# Patient Record
Sex: Female | Born: 1945 | Race: White | Hispanic: No | Marital: Married | Smoking: Never smoker
Health system: Southern US, Community
[De-identification: ages and names within clinical notes are randomized; demographics above are authoritative.]

## PROBLEM LIST (undated history)

## (undated) DIAGNOSIS — I5181 Takotsubo syndrome: Secondary | ICD-10-CM

## (undated) DIAGNOSIS — E039 Hypothyroidism, unspecified: Secondary | ICD-10-CM

## (undated) DIAGNOSIS — I519 Heart disease, unspecified: Secondary | ICD-10-CM

## (undated) HISTORY — DX: Heart disease, unspecified: I51.9

## (undated) HISTORY — PX: ABDOMINAL HYSTERECTOMY: SHX81

## (undated) HISTORY — PX: TONSILLECTOMY: SUR1361

## (undated) HISTORY — DX: Takotsubo syndrome: I51.81

## (undated) HISTORY — DX: Hypothyroidism, unspecified: E03.9

## (undated) HISTORY — PX: COLONOSCOPY: SHX174

---

## 1995-06-19 HISTORY — PX: THYROIDECTOMY: SHX17

## 2000-10-28 ENCOUNTER — Ambulatory Visit (HOSPITAL_COMMUNITY): Admission: RE | Admit: 2000-10-28 | Discharge: 2000-10-28 | Payer: Self-pay | Admitting: Family Medicine

## 2002-03-12 ENCOUNTER — Ambulatory Visit (HOSPITAL_COMMUNITY): Admission: RE | Admit: 2002-03-12 | Discharge: 2002-03-12 | Payer: Self-pay | Admitting: Family Medicine

## 2002-03-12 ENCOUNTER — Encounter: Payer: Self-pay | Admitting: Family Medicine

## 2002-06-02 ENCOUNTER — Ambulatory Visit (HOSPITAL_COMMUNITY): Admission: RE | Admit: 2002-06-02 | Discharge: 2002-06-02 | Payer: Self-pay | Admitting: Internal Medicine

## 2002-12-08 ENCOUNTER — Encounter: Payer: Self-pay | Admitting: Family Medicine

## 2002-12-08 ENCOUNTER — Ambulatory Visit (HOSPITAL_COMMUNITY): Admission: RE | Admit: 2002-12-08 | Discharge: 2002-12-08 | Payer: Self-pay | Admitting: Family Medicine

## 2004-03-21 ENCOUNTER — Ambulatory Visit (HOSPITAL_COMMUNITY): Admission: RE | Admit: 2004-03-21 | Discharge: 2004-03-21 | Payer: Self-pay | Admitting: Family Medicine

## 2005-10-18 ENCOUNTER — Ambulatory Visit (HOSPITAL_COMMUNITY): Admission: RE | Admit: 2005-10-18 | Discharge: 2005-10-18 | Payer: Self-pay | Admitting: *Deleted

## 2007-04-08 ENCOUNTER — Ambulatory Visit (HOSPITAL_COMMUNITY): Admission: RE | Admit: 2007-04-08 | Discharge: 2007-04-08 | Payer: Self-pay | Admitting: *Deleted

## 2007-05-05 ENCOUNTER — Ambulatory Visit (HOSPITAL_COMMUNITY): Admission: RE | Admit: 2007-05-05 | Discharge: 2007-05-05 | Payer: Self-pay | Admitting: *Deleted

## 2008-04-20 ENCOUNTER — Ambulatory Visit (HOSPITAL_COMMUNITY): Admission: RE | Admit: 2008-04-20 | Discharge: 2008-04-20 | Payer: Self-pay | Admitting: Family Medicine

## 2009-05-11 ENCOUNTER — Ambulatory Visit (HOSPITAL_COMMUNITY): Admission: RE | Admit: 2009-05-11 | Discharge: 2009-05-11 | Payer: Self-pay | Admitting: Family Medicine

## 2009-06-06 ENCOUNTER — Ambulatory Visit (HOSPITAL_COMMUNITY): Admission: RE | Admit: 2009-06-06 | Discharge: 2009-06-06 | Payer: Self-pay | Admitting: Family Medicine

## 2010-05-22 ENCOUNTER — Ambulatory Visit (HOSPITAL_COMMUNITY)
Admission: RE | Admit: 2010-05-22 | Discharge: 2010-05-22 | Payer: Self-pay | Source: Home / Self Care | Admitting: Family Medicine

## 2010-07-09 ENCOUNTER — Encounter: Payer: Self-pay | Admitting: Family Medicine

## 2010-11-03 NOTE — Op Note (Signed)
   NAME:  Martha Swanson, Martha Swanson                        ACCOUNT NO.:  [id number]   MEDICAL RECORD NO.:  [id number]                   PATIENT TYPE:  AMB   LOCATION:  DAY                                  FACILITY:  [facility]   PHYSICIAN:  Lionel December, M.D.                 DATE OF BIRTH:  [birth year]   DATE OF PROCEDURE:  06/02/2002  DATE OF DISCHARGE:                                 OPERATIVE REPORT   PROCEDURE:  Total colonoscopy.   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  Family history is negative for colorectal  carcinoma.  The procedure risks were reviewed and informed consent was  obtained.   PREMEDICATION:  Demerol 30 mg IV, Versed 3 mg IV in divided dose.   INSTRUMENT USED:  Olympus video system.   FINDINGS:  Procedure performed in endoscopy suite.  The patient's vital  signs and O2 saturation were monitored during the procedure and remained  stable.  The patient was placed in the left lateral recumbent position and a  rectal examination performed.  No abnormality noted on external or digital  exam.  The scope was placed in the rectum and advanced under vision in the  sigmoid colon and beyond.  Preparation was excellent.  The scope was passed  to the cecum, which was identified by appendiceal orifice and ileocecal  valve.  A picture was taken for the record.  As the scope was withdrawn,  colonic mucosa was once again carefully examined and was normal throughout.  Rectal mucosa similarly was normal.  The scope was retroflexed to examine  the anorectal junction, and small hemorrhoids were noted below the dentate  line.  Endoscope was straightened and withdrawn.  The patient tolerated the  procedure well.   FINAL DIAGNOSES:  Small external hemorrhoids, otherwise normal colonoscopy.    RECOMMENDATIONS:  1. Yearly Hemoccults.  2. She should consider having the next screening exam in 10 years from now.                                            Lionel December, M.D.    NR/MEDQ  D:  06/02/2002  T:  06/02/2002  Job:  [id number]   cc:   Donzetta Sprung  [address]  [place]  [place] [address]  Fax: [phone]

## 2011-02-12 ENCOUNTER — Inpatient Hospital Stay (HOSPITAL_COMMUNITY)
Admission: EM | Admit: 2011-02-12 | Discharge: 2011-02-16 | DRG: 287 | Disposition: A | Discharge status: Home / Self Care | Payer: Medicare Other | Source: Other Acute Inpatient Hospital | Attending: Cardiovascular Disease | Admitting: Cardiovascular Disease

## 2011-02-12 DIAGNOSIS — I059 Rheumatic mitral valve disease, unspecified: Secondary | ICD-10-CM | POA: Diagnosis present

## 2011-02-12 DIAGNOSIS — Z880 Allergy status to penicillin: Secondary | ICD-10-CM

## 2011-02-12 DIAGNOSIS — I5181 Takotsubo syndrome: Secondary | ICD-10-CM | POA: Diagnosis present

## 2011-02-12 DIAGNOSIS — Z7982 Long term (current) use of aspirin: Secondary | ICD-10-CM

## 2011-02-12 DIAGNOSIS — Z79899 Other long term (current) drug therapy: Secondary | ICD-10-CM

## 2011-02-12 DIAGNOSIS — I200 Unstable angina: Principal | ICD-10-CM | POA: Diagnosis present

## 2011-02-12 DIAGNOSIS — R079 Chest pain, unspecified: Secondary | ICD-10-CM

## 2011-02-12 DIAGNOSIS — E785 Hyperlipidemia, unspecified: Secondary | ICD-10-CM | POA: Diagnosis present

## 2011-02-12 DIAGNOSIS — E7800 Pure hypercholesterolemia, unspecified: Secondary | ICD-10-CM | POA: Diagnosis present

## 2011-02-12 DIAGNOSIS — I959 Hypotension, unspecified: Secondary | ICD-10-CM | POA: Diagnosis present

## 2011-02-12 DIAGNOSIS — E039 Hypothyroidism, unspecified: Secondary | ICD-10-CM | POA: Diagnosis present

## 2011-02-12 LAB — CARDIAC PANEL(CRET KIN+CKTOT+MB+TROPI)
CK, MB: 19.5 ng/mL (ref 0.3–4.0)
Relative Index: 10.2 — ABNORMAL HIGH (ref 0.0–2.5)
Total CK: 191 U/L — ABNORMAL HIGH (ref 7–177)
Troponin I: 2.69 ng/mL (ref <0.30)

## 2011-02-12 LAB — MRSA PCR SCREENING: MRSA by PCR: POSITIVE — AB

## 2011-02-13 ENCOUNTER — Inpatient Hospital Stay (HOSPITAL_COMMUNITY): Payer: Medicare Other

## 2011-02-13 LAB — LIPID PANEL
Cholesterol: 203 mg/dL — ABNORMAL HIGH (ref 0–200)
HDL: 54 mg/dL (ref >39)
LDL Cholesterol: 110 mg/dL — ABNORMAL HIGH (ref 0–99)
Total CHOL/HDL Ratio: 3.8 RATIO
Triglycerides: 194 mg/dL — ABNORMAL HIGH (ref <150)
VLDL: 39 mg/dL (ref 0–40)

## 2011-02-13 LAB — CARDIAC PANEL(CRET KIN+CKTOT+MB+TROPI)
CK, MB: 17 ng/mL (ref 0.3–4.0)
Relative Index: 9.8 — ABNORMAL HIGH (ref 0.0–2.5)
Total CK: 173 U/L (ref 7–177)
Troponin I: 2.99 ng/mL (ref <0.30)

## 2011-02-15 LAB — BASIC METABOLIC PANEL
BUN: 11 mg/dL (ref 6–23)
CO2: 25 mEq/L (ref 19–32)
Calcium: 9 mg/dL (ref 8.4–10.5)
Chloride: 109 mEq/L (ref 96–112)
Creatinine, Ser: 0.76 mg/dL (ref 0.50–1.10)
GFR calc Af Amer: >60 mL/min (ref >60)
GFR calc non Af Amer: >60 mL/min (ref >60)
Glucose, Bld: 97 mg/dL (ref 70–99)
Potassium: 4 mEq/L (ref 3.5–5.1)
Sodium: 140 mEq/L (ref 135–145)

## 2011-02-15 LAB — CBC
HCT: 38.6 % (ref 36.0–46.0)
Hemoglobin: 13.1 g/dL (ref 12.0–15.0)
MCH: 29.3 pg (ref 26.0–34.0)
MCHC: 33.9 g/dL (ref 30.0–36.0)
MCV: 86.4 fL (ref 78.0–100.0)
Platelets: 231 10*3/uL (ref 150–400)
RBC: 4.47 MIL/uL (ref 3.87–5.11)
RDW: 13.1 % (ref 11.5–15.5)
WBC: 5.3 10*3/uL (ref 4.0–10.5)

## 2011-02-16 DIAGNOSIS — I200 Unstable angina: Secondary | ICD-10-CM

## 2011-02-21 NOTE — Cardiovascular Report (Signed)
Martha Swanson, Martha Swanson              ACCOUNT NO.:  [id number]  MEDICAL RECORD NO.:  [id number]  LOCATION:  2906                         FACILITY:  [facility]  PHYSICIAN:  Veverly Fells. Excell Seltzer, MD  DATE OF BIRTH:  07-06-1945  DATE OF PROCEDURE:  02/12/2011 DATE OF DISCHARGE:                           CARDIAC CATHETERIZATION   PROCEDURES: 1. Left heart catheterization. 2. Selective coronary angiography. 3. Left ventricular angiography.  PROCEDURAL INDICATIONS:  [name] is a 65 year old woman who presented to [facility] this afternoon with unrelenting chest pain.  An EKG demonstrated anterior ST elevation.  She was evaluated probably by Dr. Andee Lineman, who performed a bedside echo demonstrating marked anteroapical and inferoapical akinesis with severe reduction in overall LV function.  He suspected takotsubo cardiomyopathy, but we are concerned about the possibility of ST elevation infarction based on her clinical presentation and EKG changes.  She was referred emergently for cardiac cath and presented directly to the cardiac cath lab via EMS.  Risks and indications of procedure were reviewed with the patient. Emergency consent was obtained.  The right wrist was prepped, draped, and anesthetized with 1% lidocaine using modified Seldinger technique. A 6-French sheath was placed in the right radial artery, 3 mg of verapamil was given through the sheath, 3000 units of unfractionated heparin was given intravenously.  Standard Judkins catheters were used for coronary angiography.  A pigtail catheter was used for left ventriculography.  There were no immediate procedural complications.  FINDINGS:  Aortic pressure 94/57 with a mean of 70, left ventricular pressure 128/30.  Left ventriculography shows akinesis of the anteroapical and inferoapical walls.  The base of the LV is hyperdynamic.  The ejection fraction is estimated at 25-30%.  There is moderate mitral regurgitation.  The left  ventricular appearance is one that is typical of takotsubo cardiomyopathy.  Coronary angiography.  The left mainstem is widely patent and divides into the LAD and left circumflex.  LAD:  Widely patent vessel with a large first diagonal.  There is no obstructive disease throughout the course of the LAD or its diagonal branches.  Left circumflex.  The left circumflex is a smooth vessel.  There is no obstructive disease visualized.  They are two major obtuse marginal branches without significant disease.  Right coronary artery:  The RCA is a dominant vessel.  There is no obstructive disease visualized.  It gives off a PDA and a posterolateral branch.  FINAL ASSESSMENT: 1. Widely patent coronary arteries. 2. Severe segmental left ventricular systolic dysfunction consistent     with takotsubo cardiomyopathy. 3. Moderate gradient suspect dynamic LV outflow tract obstruction in     the setting of takotsubo cardiomyopathy. 4. Moderate mitral regurgitation.  RECOMMENDATIONS:  The patient will be treated medically.  Her left ventricular filling pressures are very high and I am going to give her 40 mg of IV Lasix.  She bottomed her blood pressure out with nitroglycerin at [facility], and I suspect she is somewhat preload dependent.  We will follow her closely and we will try to initiate and we will aim for early initiation of a beta-blocker as well.     Veverly Fells. Excell Seltzer, MD     MDC/MEDQ  D:  02/12/2011  T:  02/13/2011  Job:  [id number]  cc:   Donzetta Sprung, MD Learta Codding, MD,FACC  Electronically Signed by Tonny Bollman MD on 02/21/2011 11:37:09 PM

## 2011-03-01 NOTE — Discharge Summary (Signed)
Martha Swanson, Martha Swanson NO.:  [id number]  MEDICAL RECORD NO.:  [id number]  LOCATION:  2007                         FACILITY:  [facility]  PHYSICIAN:  Verne Carrow, MDDATE OF BIRTH:  1945/09/04  DATE OF ADMISSION:  02/12/2011 DATE OF DISCHARGE:  02/16/2011                              DISCHARGE SUMMARY   PRIMARY CARDIOLOGIST:  Learta Codding, MD,FACC  DISCHARGE DIAGNOSIS:  Acute coronary syndrome.  SECONDARY DIAGNOSES: 1. 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 2. Hyperlipidemia. 3. Normal coronary arteries by catheterization this admission. 4. Hypotension preventing initiation of ACE inhibitor therapy. 5. Hypothyroidism.  ALLERGIES:  PENICILLIN causes anaphylaxis.  PROCEDURES:  Emergent left heart cardiac catheterization revealing normal coronary arteries with an EF 25-30% with typical Takotsubo appearance including moderate mitral regurgitation and anteroapical and inferoapical akinesis in a hyperdynamic base.  HISTORY OF PRESENT ILLNESS:  A 65 year old female without prior cardiac history who was in her usual state of health until approximately 11:30 a.m. on the day of admission when she developed substernal chest discomfort prompting her to present to [facility] where she was found to have significant anterior ST-segment elevation by ECG.  A code STEMI was called and bedside echo was performed showing marked anteroapical and inferior apical akinesis with severe reduction of her LV function.  There was high suspicion for Takotsubo cardiomyopathy but given ECG changes and symptoms, the patient was transferred to [facility] for emergent catheterization.  HOSPITAL COURSE:  The patient underwent diagnostic cardiac catheterization revealing normal coronary arteries with an EF of 25-30% with anteroapical and inferoapical akinesis suggestive of Takotsubo cardiomyopathy.  Medical therapy  was warranted and the patient was placed on beta-blocker therapy postprocedure.  Unfortunately, her blood pressures were relatively soft ranging in the 90-100 range thus preventing [place] from initiating ACE inhibitor therapy.  She has had no recurrent chest pain and despite an EF of 25-30% has had no evidence of congestive heart failure by exam.  We have been able to titrate her beta- blocker some and have consolidated to date Toprol XL.  She has been seen by Cardiac Rehab and is ambulating without difficulty.  She has also been counseled as to the importance of daily weights and sodium restriction, medication compliance, and symptom reporting.  She will be discharged home today in good condition and we will arrange for followup in our [place] office in approximately 2-3 weeks.  She will require followup echocardiogram over the next 3 months to reevaluate LV function on medical therapy.  DISCHARGE LABORATORY:  Hemoglobin 13.1, hematocrit 38.6, WBC 5.3, platelets 231.  Sodium 140, potassium 4.0, chloride 109, CO2 25, BUN 11, creatinine 0.76, glucose 97, calcium 9.0.  CK 173, MB 17.0, troponin-I 2.99, total cholesterol 203, triglycerides 194, HDL 54, LDL 110.  MRSA screen was positive.  DISPOSITION:  The patient will be discharged home today in good condition.  FOLLOWUP PLANS AND APPOINTMENTS:  We will arrange for followup in our [facility] office in approximately 2-3 weeks.  DISCHARGE MEDICATIONS: 1. Lipitor 10 mg at bedtime. 2. Nitroglycerin 0.4 mg sublingual p.r.n. chest pain. 3. Toprol-XL 25 mg 1-1/2 tablets daily. 4. Aspirin 81  mg daily. 5. Citracal plus D 1 tablet b.i.d. 6. Fish oil 1 capsule at bedtime. 7. Prilosec 20 mg daily p.r.n. 8. Synthroid 50 mcg daily.  OUTSTANDING LABORATORY STUDIES:  Followup echo in approximately 3 months.  DURATION OF DISCHARGE ENCOUNTER:  45 minutes including physician time.     Nicolasa Ducking, ANP   ______________________________ Verne Carrow, MD    CB/MEDQ  D:  02/16/2011  T:  02/16/2011  Job:  [id number]  Electronically Signed by Nicolasa Ducking ANP on 03/01/2011 03:20:11 PM Electronically Signed by Verne Carrow MD on 03/01/2011 09:36:21 PM

## 2011-03-07 ENCOUNTER — Ambulatory Visit (INDEPENDENT_AMBULATORY_CARE_PROVIDER_SITE_OTHER): Payer: Medicare Other | Admitting: Cardiology

## 2011-03-07 ENCOUNTER — Encounter: Payer: Self-pay | Admitting: Cardiology

## 2011-03-07 VITALS — BP 103/62 | HR 59 | Ht 60.0 in | Wt 111.0 lb

## 2011-03-07 DIAGNOSIS — I5181 Takotsubo syndrome: Secondary | ICD-10-CM | POA: Insufficient documentation

## 2011-03-07 NOTE — Progress Notes (Signed)
HPI The patient is a 65 year old female who presented with broken heart syndrome 3 weeks ago to the emergency room. She presented with substernal chest pain and EKG changes consistent with ischemia/infarction. Bedside echocardiogram however and the clinical presentation were more suggestive of broken heart syndrome. She was referred for cardiac catheterization which showed normal coronary arteries. The patient's left ventriculogram was consistent to broken heart syndrome. Her ejection fraction was very poor probably around 20% she was placed on beta blocker therapy and now presents for followup. She only was 5 days in the hospital and had a rapid recovery. She denies any chest pain or shortness of breath. A followup bedside echocardiogram today done in the office only 3 months after her initial presentation already shows normal heart function.   Allergies  Allergen Reactions  . Penicillins     No current outpatient prescriptions on file prior to visit.    No past medical history on file.  No past surgical history on file.  No family history on file.  History   Social History  . Marital Status: Married    Spouse Name: N/A    Number of Children: N/A  . Years of Education: N/A   Occupational History  . Not on file.   Social History Main Topics  . Smoking status: Never Smoker   . Smokeless tobacco: Never Used  . Alcohol Use: Not on file  . Drug Use: Not on file  . Sexually Active: Not on file   Other Topics Concern  . Not on file   Social History Narrative  . No narrative on file   ROS: Pertinent positives as outlined above. The remainder of the 18  point review of systems is negative  PHYSICAL EXAM BP 103/62  Pulse 59  Ht 5' (1.524 m)  Wt 111 lb (50.349 kg)  BMI 21.68 kg/m2 General: Well-developed, well-nourished in no distress Head: Normocephalic and atraumatic Eyes:PERRLA/EOMI intact, conjunctiva and lids normal Ears: No deformity or lesions Mouth:normal  dentition, normal posterior pharynx Neck: Supple, no JVD.  No masses, thyromegaly or abnormal cervical nodes Lungs: Normal breath sounds bilaterally without wheezing.  Normal percussion Cardiac: regular rate and rhythm with normal S1 and S2, no S3 or S4.  PMI is normal.  No pathological murmurs Abdomen: Normal bowel sounds, abdomen is soft and nontender without masses, organomegaly or hernias noted.  No hepatosplenomegaly MSK: Back normal, normal gait muscle strength and tone normal Vascular: Pulse is normal in all 4 extremities Extremities: No peripheral pitting edema Neurologic: Alert and oriented x 3 Skin: Intact without lesions or rashes Lymphatics: No significant adenopathy Psychologic: Normal affect  ECG: Not obtained  ASSESSMENT AND PLAN

## 2011-03-07 NOTE — Patient Instructions (Signed)
Continue all current medications. Your physician wants you to follow up in: 6 months.  You will receive a reminder letter in the mail one-two months in advance.  If you don't receive a letter, please call our office to schedule the follow up appointment   

## 2011-03-07 NOTE — Assessment & Plan Note (Signed)
Echocardiogram today demonstrates hyperdynamic LV function. Resolution of broken heart syndrome. Continue for now medical therapy with beta blocker. Likely can be discontinued in 6 months we will see the patient in followup.

## 2011-05-14 ENCOUNTER — Other Ambulatory Visit (HOSPITAL_COMMUNITY): Payer: Self-pay | Admitting: Family Medicine

## 2011-05-14 DIAGNOSIS — Z139 Encounter for screening, unspecified: Secondary | ICD-10-CM

## 2011-05-25 ENCOUNTER — Ambulatory Visit (HOSPITAL_COMMUNITY)
Admission: RE | Admit: 2011-05-25 | Discharge: 2011-05-25 | Disposition: A | Discharge status: Home / Self Care | Payer: Medicare Other | Source: Ambulatory Visit | Attending: Family Medicine | Admitting: Family Medicine

## 2011-05-25 DIAGNOSIS — Z139 Encounter for screening, unspecified: Secondary | ICD-10-CM

## 2011-05-25 DIAGNOSIS — Z1231 Encounter for screening mammogram for malignant neoplasm of breast: Secondary | ICD-10-CM | POA: Insufficient documentation

## 2011-08-28 ENCOUNTER — Encounter: Payer: Self-pay | Admitting: *Deleted

## 2011-09-04 ENCOUNTER — Encounter: Payer: Self-pay | Admitting: Cardiology

## 2011-09-04 ENCOUNTER — Ambulatory Visit (INDEPENDENT_AMBULATORY_CARE_PROVIDER_SITE_OTHER): Payer: Medicare Other | Admitting: Cardiology

## 2011-09-04 VITALS — BP 129/67 | HR 62 | Ht 60.0 in | Wt 106.0 lb

## 2011-09-04 DIAGNOSIS — I5181 Takotsubo syndrome: Secondary | ICD-10-CM | POA: Insufficient documentation

## 2011-09-04 DIAGNOSIS — E039 Hypothyroidism, unspecified: Secondary | ICD-10-CM | POA: Insufficient documentation

## 2011-09-04 NOTE — Progress Notes (Signed)
   Martha Bottoms, MD, [facility] ABIM Board Certified in Adult Cardiovascular Medicine,Internal Medicine and Critical Care Medicine    CC: Followup patient to broken heart syndrome  HPI:  Patient is doing quite well. She denies any chest pain shortness of breath orthopnea PND. She has had no heart failure symptoms. She has not been under any stress. Catheterization at the time of her initial presentation showed no coronary artery disease her ejection fraction has normalized. She denies any palpitations presyncope or syncope.  PMH: reviewed and listed in Problem List in Electronic Records (and see below) Past Medical History  Diagnosis Date  . Unspecified hypothyroidism   . Takotsubo cardiomyopathy      cardiac catheterization initial ejection fraction 20% no coronary artery disease ejection fraction improved after 5 days    No past surgical history on file.  Allergies/SH/FHX : available in Electronic Records for review  Allergies  Allergen Reactions  . Penicillins Anaphylaxis   History   Social History  . Marital Status: Married    Spouse Name: N/A    Number of Children: N/A  . Years of Education: N/A   Occupational History  .      Part-time pastor   Social History Main Topics  . Smoking status: Never Smoker   . Smokeless tobacco: Never Used  . Alcohol Use: Not on file  . Drug Use: Not on file  . Sexually Active: Not on file   Other Topics Concern  . Not on file   Social History Narrative  . No narrative on file   Family History  Problem Relation Age of Onset  . Heart attack Father 23    Medications: Current Outpatient Prescriptions  Medication Sig Dispense Refill  . aspirin 325 MG tablet Take 325 mg by mouth daily.        . calcium citrate-vitamin D (CITRACAL+D) 315-200 MG-UNIT per tablet Take 1 tablet by mouth daily.        [name] levothyroxine (SYNTHROID, LEVOTHROID) 50 MCG tablet Take 1 tablet by mouth Daily.      . metoprolol succinate (TOPROL-XL) 25 MG 24 hr  tablet Take 1.5 tablets by mouth Daily.      [name] NITROSTAT 0.4 MG SL tablet Take 1 tablet by mouth every 5 (five) minutes x 3 doses as needed.        ROS: No nausea or vomiting. No fever or chills.No melena or hematochezia.No bleeding.No claudication  Physical Exam: BP 129/67  Pulse 62  Ht 5' (1.524 m)  Wt 106 lb (48.081 kg)  BMI 20.70 kg/m2 General: Well-nourished white female in no distress Neck: Normal carotid upstroke no carotid bruit. No thyromegaly nonnodular thyroid. JVP is 6 cm Lungs: Clear breath sounds bilaterally. No wheezing Cardiac: Regular rate and rhythm with normal S1-S2 no pathological murmurs Vascular: No edema Skin: Warm and dry Physcologic: Normal affect  12lead ECG: Normal sinus rhythm  Nonspecific ST-T wave changes  Limited bedside ECHO:N/A No images are attached to the encounter.     Patient Active Problem List  Diagnoses  . Takotsubo cardiomyopathy  . Unspecified hypothyroidism    PLAN   Bedside echocardiogram during last office visit demonstrated normal last ejection fraction.  We'll continue low-dose beta blocker.  Patient was given a copy of her electrocardiogram for future reference.  She was also given literature on Minfulness  stress reduction techniques.  No definite indication for followup echocardiogram

## 2011-09-04 NOTE — Patient Instructions (Signed)
Continue all current medications. Follow up as needed  

## 2011-09-12 ENCOUNTER — Other Ambulatory Visit: Payer: Self-pay | Admitting: Cardiovascular Disease

## 2012-03-26 DIAGNOSIS — Z23 Encounter for immunization: Secondary | ICD-10-CM | POA: Diagnosis not present

## 2012-04-10 ENCOUNTER — Other Ambulatory Visit: Payer: Self-pay | Admitting: Cardiology

## 2012-05-12 ENCOUNTER — Other Ambulatory Visit (HOSPITAL_COMMUNITY): Payer: Self-pay | Admitting: Family Medicine

## 2012-05-12 DIAGNOSIS — Z139 Encounter for screening, unspecified: Secondary | ICD-10-CM

## 2012-05-26 ENCOUNTER — Ambulatory Visit (HOSPITAL_COMMUNITY)
Admission: RE | Admit: 2012-05-26 | Discharge: 2012-05-26 | Disposition: A | Discharge status: Home / Self Care | Payer: Medicare Other | Source: Ambulatory Visit | Attending: Family Medicine | Admitting: Family Medicine

## 2012-05-26 DIAGNOSIS — Z139 Encounter for screening, unspecified: Secondary | ICD-10-CM

## 2012-05-26 DIAGNOSIS — Z1231 Encounter for screening mammogram for malignant neoplasm of breast: Secondary | ICD-10-CM | POA: Diagnosis not present

## 2012-05-29 DIAGNOSIS — E782 Mixed hyperlipidemia: Secondary | ICD-10-CM | POA: Diagnosis not present

## 2012-05-29 DIAGNOSIS — E039 Hypothyroidism, unspecified: Secondary | ICD-10-CM | POA: Diagnosis not present

## 2012-06-05 DIAGNOSIS — E039 Hypothyroidism, unspecified: Secondary | ICD-10-CM | POA: Diagnosis not present

## 2012-06-05 DIAGNOSIS — I10 Essential (primary) hypertension: Secondary | ICD-10-CM | POA: Diagnosis not present

## 2012-06-05 DIAGNOSIS — Z0000 Encounter for general adult medical examination without abnormal findings: Secondary | ICD-10-CM | POA: Diagnosis not present

## 2012-06-25 ENCOUNTER — Encounter (INDEPENDENT_AMBULATORY_CARE_PROVIDER_SITE_OTHER): Payer: Self-pay | Admitting: *Deleted

## 2013-02-05 ENCOUNTER — Telehealth (INDEPENDENT_AMBULATORY_CARE_PROVIDER_SITE_OTHER): Payer: Self-pay | Admitting: *Deleted

## 2013-02-05 ENCOUNTER — Other Ambulatory Visit (INDEPENDENT_AMBULATORY_CARE_PROVIDER_SITE_OTHER): Payer: Self-pay | Admitting: *Deleted

## 2013-02-05 ENCOUNTER — Encounter (INDEPENDENT_AMBULATORY_CARE_PROVIDER_SITE_OTHER): Payer: Self-pay | Admitting: *Deleted

## 2013-02-05 DIAGNOSIS — Z1211 Encounter for screening for malignant neoplasm of colon: Secondary | ICD-10-CM

## 2013-02-05 MED ORDER — PEG-KCL-NACL-NASULF-NA ASC-C 100 G PO SOLR: 1.0000 | Freq: Once | ORAL | Status: DC

## 2013-02-05 NOTE — Telephone Encounter (Signed)
Patient needs movi prep 

## 2013-03-05 ENCOUNTER — Telehealth (INDEPENDENT_AMBULATORY_CARE_PROVIDER_SITE_OTHER): Payer: Self-pay | Admitting: *Deleted

## 2013-03-05 NOTE — Telephone Encounter (Signed)
  Procedure: tcs  Reason/Indication:  screening  Has patient had this procedure before?  Yes, 10 yrs ago  If so, when, by whom and where?    Is there a family history of colon cancer?  no  Who?  What age when diagnosed?    Is patient diabetic?   no      Does patient have prosthetic heart valve?  no  Do you have a pacemaker?  no  Has patient ever had endocarditis? no  Has patient had joint replacement within last 12 months?  no  Is patient on Coumadin, Plavix and/or Aspirin? no  Medications: synthroid 50 mcg daily  Allergies: pcn  Medication Adjustment:   Procedure date & time: 04/01/13 at 830

## 2013-03-06 NOTE — Telephone Encounter (Signed)
agree

## 2013-03-20 ENCOUNTER — Encounter (HOSPITAL_COMMUNITY): Payer: Self-pay | Admitting: Pharmacy Technician

## 2013-03-23 DIAGNOSIS — Z23 Encounter for immunization: Secondary | ICD-10-CM | POA: Diagnosis not present

## 2013-04-01 ENCOUNTER — Encounter (HOSPITAL_COMMUNITY)
Admission: RE | Disposition: A | Discharge status: Home / Self Care | Payer: Self-pay | Source: Ambulatory Visit | Attending: Internal Medicine

## 2013-04-01 ENCOUNTER — Ambulatory Visit (HOSPITAL_COMMUNITY)
Admission: RE | Admit: 2013-04-01 | Discharge: 2013-04-01 | Disposition: A | Discharge status: Home / Self Care | Payer: Medicare Other | Source: Ambulatory Visit | Attending: Internal Medicine | Admitting: Internal Medicine

## 2013-04-01 ENCOUNTER — Encounter (HOSPITAL_COMMUNITY): Payer: Self-pay | Admitting: *Deleted

## 2013-04-01 DIAGNOSIS — D126 Benign neoplasm of colon, unspecified: Secondary | ICD-10-CM | POA: Diagnosis not present

## 2013-04-01 DIAGNOSIS — Z1211 Encounter for screening for malignant neoplasm of colon: Secondary | ICD-10-CM | POA: Diagnosis not present

## 2013-04-01 HISTORY — PX: COLONOSCOPY: SHX5424

## 2013-04-01 SURGERY — COLONOSCOPY
Anesthesia: Moderate Sedation

## 2013-04-01 MED ORDER — STERILE WATER FOR IRRIGATION IR SOLN
Status: DC | PRN
  Administered 2013-04-01: 08:00:00

## 2013-04-01 MED ORDER — MEPERIDINE HCL 50 MG/ML IJ SOLN
INTRAMUSCULAR | Status: DC | PRN
  Administered 2013-04-01: 25 mg via INTRAVENOUS

## 2013-04-01 MED ORDER — MIDAZOLAM HCL 5 MG/5ML IJ SOLN
INTRAMUSCULAR | Status: DC | PRN
  Administered 2013-04-01 (×2): 2 mg via INTRAVENOUS
  Administered 2013-04-01: 1 mg via INTRAVENOUS

## 2013-04-01 MED ORDER — MIDAZOLAM HCL 5 MG/5ML IJ SOLN
INTRAMUSCULAR | Status: AC
  Filled 2013-04-01: qty 10

## 2013-04-01 MED ORDER — MEPERIDINE HCL 50 MG/ML IJ SOLN
INTRAMUSCULAR | Status: AC
  Filled 2013-04-01: qty 1

## 2013-04-01 MED ORDER — SODIUM CHLORIDE 0.9 % IV SOLN
INTRAVENOUS | Status: DC
  Administered 2013-04-01: 08:00:00 via INTRAVENOUS

## 2013-04-01 NOTE — Discharge Instructions (Signed)
Colonoscopy A colonoscopy is an exam to evaluate your entire colon. In this exam, your colon is cleansed. A long fiberoptic tube is inserted through your rectum and into your colon. The fiberoptic scope (endoscope) is a long bundle of enclosed and very flexible fibers. These fibers transmit light to the area examined and send images from that area to your caregiver. Discomfort is usually minimal. You may be given a drug to help you sleep (sedative) during or prior to the procedure. This exam helps to detect lumps (tumors), polyps, inflammation, and areas of bleeding. Your caregiver may also take a small piece of tissue (biopsy) that will be examined under a microscope. LET YOUR CAREGIVER KNOW ABOUT:   Allergies to food or medicine.  Medicines taken, including vitamins, herbs, eyedrops, over-the-counter medicines, and creams.  Use of steroids (by mouth or creams).  Previous problems with anesthetics or numbing medicines.  History of bleeding problems or blood clots.  Previous surgery.  Other health problems, including diabetes and kidney problems.  Possibility of pregnancy, if this applies. BEFORE THE PROCEDURE   A clear liquid diet may be required for 2 days before the exam.  Ask your caregiver about changing or stopping your regular medications.  Liquid injections (enemas) or laxatives may be required.  A large amount of electrolyte solution may be given to you to drink over a short period of time. This solution is used to clean out your colon.  You should be present 60 minutes prior to your procedure or as directed by your caregiver. AFTER THE PROCEDURE   If you received a sedative or pain relieving medication, you will need to arrange for someone to drive you home.  Occasionally, there is a little blood passed with the first bowel movement. Do not be concerned. FINDING OUT THE RESULTS OF YOUR TEST Not all test results are available during your visit. If your test results are  not back during the visit, make an appointment with your caregiver to find out the results. Do not assume everything is normal if you have not heard from your caregiver or the medical facility. It is important for you to follow up on all of your test results. HOME CARE INSTRUCTIONS   It is not unusual to pass moderate amounts of gas and experience mild abdominal cramping following the procedure. This is due to air being used to inflate your colon during the exam. Walking or a warm pack on your belly (abdomen) may help.  You may resume all normal meals and activities after sedatives and medicines have worn off.  Only take over-the-counter or prescription medicines for pain, discomfort, or fever as directed by your caregiver. Do not use aspirin or blood thinners if a biopsy was taken. Consult your caregiver for medicine usage if biopsies were taken. SEEK IMMEDIATE MEDICAL CARE IF:   You have a fever.  You pass large blood clots or fill a toilet with blood following the procedure. This may also occur 10 to 14 days following the procedure. This is more likely if a biopsy was taken.  You develop abdominal pain that keeps getting worse and cannot be relieved with medicine. Document Released: 06/01/2000 Document Revised: 08/27/2011 Document Reviewed: 01/15/2008 [facility] Patient Information 2014 [place], [place].    Resume usual medications and diet. No aspirin for one week. No driving for 24 hours. Remember you cannot have an MRI until Hemoclip has passed

## 2013-04-01 NOTE — H&P (Addendum)
Martha Swanson is an 67 y.o. female.   Chief Complaint: Patient is here for colonoscopy. 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 She denies abdominal pain change in her bowel habits or rectal bleeding. Last colonoscopy was 10 years ago and was normal. Family history is negative for CRC.  Past Medical History  Diagnosis Date  . Unspecified hypothyroidism   . Takotsubo cardiomyopathy      cardiac catheterization initial ejection fraction 20% no coronary artery disease ejection fraction improved after 5 days     Past Surgical History  Procedure Laterality Date  . Abdominal hysterectomy    . Colonoscopy    . Thyroidectomy  1997  . Tonsillectomy      Family History  Problem Relation Age of Onset  . Heart attack Father 68  . Colon cancer Neg Hx    Social History:  reports that she has never smoked. She has never used smokeless tobacco. She reports that she drinks alcohol. She reports that she does not use illicit drugs.  Allergies:  Allergies  Allergen Reactions  . Penicillins Anaphylaxis    Medications Prior to Admission  Medication Sig Dispense Refill  . calcium citrate-vitamin D (CITRACAL+D) 315-200 MG-UNIT per tablet Take 1 tablet by mouth daily.        [name] levothyroxine (SYNTHROID, LEVOTHROID) 50 MCG tablet Take 1 tablet by mouth Daily.      . peg 3350 powder (MOVIPREP) 100 G SOLR Take 1 kit (200 g total) by mouth once.  1 kit  0    No results found for this or any previous visit (from the past 48 hour(s)). No results found.  ROS  Blood pressure 148/74, pulse 78, temperature 97.7 F (36.5 C), temperature source Oral, resp. rate 22, height 4' 11.5" (1.511 m), weight 106 lb (48.081 kg), SpO2 99.00%. Physical Exam  Constitutional: She appears well-developed and well-nourished.  HENT:  Mouth/Throat: Oropharynx is clear and moist.  Eyes: Conjunctivae are normal. No scleral icterus.  Neck: No thyromegaly present.   Cardiovascular: Normal rate, regular rhythm and normal heart sounds.   Murmur: short systolic murmur best heard at aortic area  Respiratory: Effort normal and breath sounds normal.  GI: Soft. She exhibits no distension and no mass. There is no tenderness.  Musculoskeletal: She exhibits no edema.  Lymphadenopathy:    She has no cervical adenopathy.  Neurological: She is alert.  Skin: Skin is warm.     Assessment/Plan Average risk screening colonoscopy.  MarthaNAJEEB Swanson 04/01/2013, 8:28 AM

## 2013-04-01 NOTE — Op Note (Signed)
COLONOSCOPY PROCEDURE REPORT  PATIENT:  Martha Swanson  MR#:  [id number] Birthdate:  07-15-1945, 67 y.o., female Endoscopist:  Dr. [name], MD Referred By:  Dr. Donzetta Sprung, MD  Procedure Date: Apr 18, 2013  Procedure:   Colonoscopy with snare polypectomy.  Indications:  Patient is 67 year old Caucasian female who is undergoing average risk screening colonoscopy.  Informed Consent:  The procedure and risks were reviewed with the patient and informed consent was obtained.  Medications:  Demerol 25 mg IV Versed 5 mg IV  Description of procedure:  After a digital rectal exam was performed, that colonoscope was advanced from the anus through the rectum and colon to the area of the cecum, ileocecal valve and appendiceal orifice. The cecum was deeply intubated. These structures were well-seen and photographed for the record. From the level of the cecum and ileocecal valve, the scope was slowly and cautiously withdrawn. The mucosal surfaces were carefully surveyed utilizing scope tip to flexion to facilitate fold flattening as needed. The scope was pulled down into the rectum where a thorough exam including retroflexion was performed.  Findings:   Prep excellent. 6 mm flat polyp at sigmoid colon. Polyp was snared. Oozing noted from polypectomy site and controlled with application of single Hemoclip. Normal rectal mucosa and anal rectal junction.   Therapeutic/Diagnostic Maneuvers Performed:  See above  Complications:  None  Cecal Withdrawal Time:  19 minutes  Impression:  Examination performed to cecum. 6 mm flat polyp snared from sigmoid colon. Single Hemoclip applied because of oozing from polypectomy site.  Recommendations:  Standard instructions given. I will contact patient with biopsy results and further recommendations. Patient advised not to undergo MRI and to Hemoclip has passed.  Siddiq Kaluzny U  18-Apr-2013 9:13 AM  CC: Dr. Donzetta Sprung, MD & Dr. Bonnetta Barry ref.  provider found

## 2013-04-07 ENCOUNTER — Encounter (HOSPITAL_COMMUNITY): Payer: Self-pay | Admitting: Internal Medicine

## 2013-04-14 ENCOUNTER — Encounter (INDEPENDENT_AMBULATORY_CARE_PROVIDER_SITE_OTHER): Payer: Self-pay | Admitting: *Deleted

## 2013-05-12 ENCOUNTER — Other Ambulatory Visit (HOSPITAL_COMMUNITY): Payer: Self-pay | Admitting: Family Medicine

## 2013-05-12 DIAGNOSIS — Z139 Encounter for screening, unspecified: Secondary | ICD-10-CM

## 2013-05-28 ENCOUNTER — Ambulatory Visit (HOSPITAL_COMMUNITY)
Admission: RE | Admit: 2013-05-28 | Discharge: 2013-05-28 | Disposition: A | Discharge status: Home / Self Care | Payer: Medicare Other | Source: Ambulatory Visit | Attending: Family Medicine | Admitting: Family Medicine

## 2013-05-28 DIAGNOSIS — Z1231 Encounter for screening mammogram for malignant neoplasm of breast: Secondary | ICD-10-CM | POA: Insufficient documentation

## 2013-05-28 DIAGNOSIS — Z139 Encounter for screening, unspecified: Secondary | ICD-10-CM

## 2013-05-29 DIAGNOSIS — I10 Essential (primary) hypertension: Secondary | ICD-10-CM | POA: Diagnosis not present

## 2013-05-29 DIAGNOSIS — E782 Mixed hyperlipidemia: Secondary | ICD-10-CM | POA: Diagnosis not present

## 2013-05-29 DIAGNOSIS — E039 Hypothyroidism, unspecified: Secondary | ICD-10-CM | POA: Diagnosis not present

## 2013-06-02 DIAGNOSIS — H2510 Age-related nuclear cataract, unspecified eye: Secondary | ICD-10-CM | POA: Diagnosis not present

## 2013-06-09 DIAGNOSIS — Z0000 Encounter for general adult medical examination without abnormal findings: Secondary | ICD-10-CM | POA: Diagnosis not present

## 2013-06-09 DIAGNOSIS — E039 Hypothyroidism, unspecified: Secondary | ICD-10-CM | POA: Diagnosis not present

## 2013-06-09 DIAGNOSIS — Z1331 Encounter for screening for depression: Secondary | ICD-10-CM | POA: Diagnosis not present

## 2013-06-09 DIAGNOSIS — I10 Essential (primary) hypertension: Secondary | ICD-10-CM | POA: Diagnosis not present

## 2013-06-09 DIAGNOSIS — I5181 Takotsubo syndrome: Secondary | ICD-10-CM | POA: Diagnosis not present

## 2013-06-09 DIAGNOSIS — Z23 Encounter for immunization: Secondary | ICD-10-CM | POA: Diagnosis not present

## 2013-08-27 DIAGNOSIS — Z780 Asymptomatic menopausal state: Secondary | ICD-10-CM | POA: Diagnosis not present

## 2013-08-27 DIAGNOSIS — M810 Age-related osteoporosis without current pathological fracture: Secondary | ICD-10-CM | POA: Diagnosis not present

## 2014-03-18 DIAGNOSIS — Z23 Encounter for immunization: Secondary | ICD-10-CM | POA: Diagnosis not present

## 2014-05-20 ENCOUNTER — Other Ambulatory Visit (HOSPITAL_COMMUNITY): Payer: Self-pay | Admitting: Family Medicine

## 2014-05-20 DIAGNOSIS — Z1231 Encounter for screening mammogram for malignant neoplasm of breast: Secondary | ICD-10-CM

## 2014-05-31 ENCOUNTER — Ambulatory Visit (HOSPITAL_COMMUNITY)
Admission: RE | Admit: 2014-05-31 | Discharge: 2014-05-31 | Disposition: A | Discharge status: Home / Self Care | Payer: Medicare Other | Source: Ambulatory Visit | Attending: Family Medicine | Admitting: Family Medicine

## 2014-05-31 DIAGNOSIS — Z1231 Encounter for screening mammogram for malignant neoplasm of breast: Secondary | ICD-10-CM | POA: Insufficient documentation

## 2014-06-09 DIAGNOSIS — E782 Mixed hyperlipidemia: Secondary | ICD-10-CM | POA: Diagnosis not present

## 2014-06-09 DIAGNOSIS — I10 Essential (primary) hypertension: Secondary | ICD-10-CM | POA: Diagnosis not present

## 2014-06-09 DIAGNOSIS — E039 Hypothyroidism, unspecified: Secondary | ICD-10-CM | POA: Diagnosis not present

## 2014-06-16 DIAGNOSIS — Z9189 Other specified personal risk factors, not elsewhere classified: Secondary | ICD-10-CM | POA: Diagnosis not present

## 2014-06-16 DIAGNOSIS — I10 Essential (primary) hypertension: Secondary | ICD-10-CM | POA: Diagnosis not present

## 2014-06-16 DIAGNOSIS — Z1389 Encounter for screening for other disorder: Secondary | ICD-10-CM | POA: Diagnosis not present

## 2014-06-16 DIAGNOSIS — E782 Mixed hyperlipidemia: Secondary | ICD-10-CM | POA: Diagnosis not present

## 2014-06-16 DIAGNOSIS — Z0000 Encounter for general adult medical examination without abnormal findings: Secondary | ICD-10-CM | POA: Diagnosis not present

## 2014-06-16 DIAGNOSIS — E039 Hypothyroidism, unspecified: Secondary | ICD-10-CM | POA: Diagnosis not present

## 2014-07-06 DIAGNOSIS — H2513 Age-related nuclear cataract, bilateral: Secondary | ICD-10-CM | POA: Diagnosis not present

## 2015-03-10 DIAGNOSIS — Z23 Encounter for immunization: Secondary | ICD-10-CM | POA: Diagnosis not present

## 2015-05-18 ENCOUNTER — Other Ambulatory Visit (HOSPITAL_COMMUNITY): Payer: Self-pay | Admitting: Family Medicine

## 2015-05-18 DIAGNOSIS — Z1231 Encounter for screening mammogram for malignant neoplasm of breast: Secondary | ICD-10-CM

## 2015-06-06 ENCOUNTER — Ambulatory Visit (HOSPITAL_COMMUNITY)
Admission: RE | Admit: 2015-06-06 | Discharge: 2015-06-06 | Disposition: A | Discharge status: Home / Self Care | Payer: Medicare Other | Source: Ambulatory Visit | Attending: Family Medicine | Admitting: Family Medicine

## 2015-06-06 DIAGNOSIS — Z1231 Encounter for screening mammogram for malignant neoplasm of breast: Secondary | ICD-10-CM | POA: Diagnosis not present

## 2015-06-29 DIAGNOSIS — I10 Essential (primary) hypertension: Secondary | ICD-10-CM | POA: Diagnosis not present

## 2015-06-29 DIAGNOSIS — E039 Hypothyroidism, unspecified: Secondary | ICD-10-CM | POA: Diagnosis not present

## 2015-06-29 DIAGNOSIS — E782 Mixed hyperlipidemia: Secondary | ICD-10-CM | POA: Diagnosis not present

## 2015-06-30 LAB — LIPID PANEL
CHOLESTEROL: 177 (ref 0–200)
HDL: 62 (ref 35–70)
LDL Cholesterol: 91
TRIGLYCERIDES: 121 (ref 40–160)

## 2015-06-30 LAB — HEPATIC FUNCTION PANEL
ALT: 13 (ref 7–35)
AST: 13 (ref 13–35)
Alkaline Phosphatase: 71 (ref 25–125)
Bilirubin, Total: 0.3

## 2015-06-30 LAB — CBC AND DIFFERENTIAL
HCT: 42 (ref 36–46)
HEMOGLOBIN: 14.1 (ref 12.0–16.0)
Platelets: 230 (ref 150–399)
WBC: 3.5

## 2015-06-30 LAB — BASIC METABOLIC PANEL
BUN: 10 (ref 4–21)
CREATININE: 0.9 (ref 0.5–1.1)
Glucose: 91
POTASSIUM: 4.5 (ref 3.4–5.3)
SODIUM: 143 (ref 137–147)

## 2015-06-30 LAB — TSH: TSH: 1.25 (ref 0.41–5.90)

## 2015-07-06 DIAGNOSIS — E039 Hypothyroidism, unspecified: Secondary | ICD-10-CM | POA: Diagnosis not present

## 2015-07-06 DIAGNOSIS — Z23 Encounter for immunization: Secondary | ICD-10-CM | POA: Diagnosis not present

## 2015-07-06 DIAGNOSIS — Z0001 Encounter for general adult medical examination with abnormal findings: Secondary | ICD-10-CM | POA: Diagnosis not present

## 2015-07-06 DIAGNOSIS — E782 Mixed hyperlipidemia: Secondary | ICD-10-CM | POA: Diagnosis not present

## 2015-07-06 DIAGNOSIS — I10 Essential (primary) hypertension: Secondary | ICD-10-CM | POA: Diagnosis not present

## 2015-07-06 DIAGNOSIS — Z9189 Other specified personal risk factors, not elsewhere classified: Secondary | ICD-10-CM | POA: Diagnosis not present

## 2015-07-06 DIAGNOSIS — Z1389 Encounter for screening for other disorder: Secondary | ICD-10-CM | POA: Diagnosis not present

## 2016-03-08 DIAGNOSIS — Z23 Encounter for immunization: Secondary | ICD-10-CM | POA: Diagnosis not present

## 2016-03-08 DIAGNOSIS — H5203 Hypermetropia, bilateral: Secondary | ICD-10-CM | POA: Diagnosis not present

## 2016-03-08 DIAGNOSIS — H52223 Regular astigmatism, bilateral: Secondary | ICD-10-CM | POA: Diagnosis not present

## 2016-03-08 DIAGNOSIS — H2513 Age-related nuclear cataract, bilateral: Secondary | ICD-10-CM | POA: Diagnosis not present

## 2016-03-08 DIAGNOSIS — H43811 Vitreous degeneration, right eye: Secondary | ICD-10-CM | POA: Diagnosis not present

## 2016-03-08 DIAGNOSIS — H524 Presbyopia: Secondary | ICD-10-CM | POA: Diagnosis not present

## 2016-03-20 DIAGNOSIS — D239 Other benign neoplasm of skin, unspecified: Secondary | ICD-10-CM | POA: Diagnosis not present

## 2016-03-20 DIAGNOSIS — D3610 Benign neoplasm of peripheral nerves and autonomic nervous system, unspecified: Secondary | ICD-10-CM | POA: Diagnosis not present

## 2016-03-20 DIAGNOSIS — D485 Neoplasm of uncertain behavior of skin: Secondary | ICD-10-CM | POA: Diagnosis not present

## 2016-06-19 ENCOUNTER — Other Ambulatory Visit: Payer: Self-pay | Admitting: Family Medicine

## 2016-06-19 DIAGNOSIS — Z1231 Encounter for screening mammogram for malignant neoplasm of breast: Secondary | ICD-10-CM

## 2016-07-12 DIAGNOSIS — E039 Hypothyroidism, unspecified: Secondary | ICD-10-CM | POA: Diagnosis not present

## 2016-07-12 DIAGNOSIS — E782 Mixed hyperlipidemia: Secondary | ICD-10-CM | POA: Diagnosis not present

## 2016-07-12 DIAGNOSIS — Z23 Encounter for immunization: Secondary | ICD-10-CM | POA: Diagnosis not present

## 2016-07-12 DIAGNOSIS — Z1389 Encounter for screening for other disorder: Secondary | ICD-10-CM | POA: Diagnosis not present

## 2016-07-12 DIAGNOSIS — Z6821 Body mass index (BMI) 21.0-21.9, adult: Secondary | ICD-10-CM | POA: Diagnosis not present

## 2016-07-12 DIAGNOSIS — Z9189 Other specified personal risk factors, not elsewhere classified: Secondary | ICD-10-CM | POA: Diagnosis not present

## 2016-07-12 DIAGNOSIS — Z1212 Encounter for screening for malignant neoplasm of rectum: Secondary | ICD-10-CM | POA: Diagnosis not present

## 2016-07-12 DIAGNOSIS — Z0001 Encounter for general adult medical examination with abnormal findings: Secondary | ICD-10-CM | POA: Diagnosis not present

## 2016-07-13 ENCOUNTER — Ambulatory Visit
Admission: RE | Admit: 2016-07-13 | Discharge: 2016-07-13 | Disposition: A | Discharge status: Home / Self Care | Payer: Medicare Other | Source: Ambulatory Visit | Attending: Family Medicine | Admitting: Family Medicine

## 2016-07-13 DIAGNOSIS — Z1231 Encounter for screening mammogram for malignant neoplasm of breast: Secondary | ICD-10-CM | POA: Diagnosis not present

## 2017-07-08 ENCOUNTER — Other Ambulatory Visit: Payer: Self-pay | Admitting: Family Medicine

## 2017-07-08 DIAGNOSIS — Z1231 Encounter for screening mammogram for malignant neoplasm of breast: Secondary | ICD-10-CM

## 2017-07-15 DIAGNOSIS — Z0001 Encounter for general adult medical examination with abnormal findings: Secondary | ICD-10-CM | POA: Diagnosis not present

## 2017-07-15 DIAGNOSIS — Z9189 Other specified personal risk factors, not elsewhere classified: Secondary | ICD-10-CM | POA: Diagnosis not present

## 2017-07-15 DIAGNOSIS — E782 Mixed hyperlipidemia: Secondary | ICD-10-CM | POA: Diagnosis not present

## 2017-07-15 DIAGNOSIS — Z6822 Body mass index (BMI) 22.0-22.9, adult: Secondary | ICD-10-CM | POA: Diagnosis not present

## 2017-07-15 DIAGNOSIS — E039 Hypothyroidism, unspecified: Secondary | ICD-10-CM | POA: Diagnosis not present

## 2017-07-15 DIAGNOSIS — I10 Essential (primary) hypertension: Secondary | ICD-10-CM | POA: Diagnosis not present

## 2017-07-18 DIAGNOSIS — E782 Mixed hyperlipidemia: Secondary | ICD-10-CM | POA: Diagnosis not present

## 2017-07-18 DIAGNOSIS — E039 Hypothyroidism, unspecified: Secondary | ICD-10-CM | POA: Diagnosis not present

## 2017-07-18 DIAGNOSIS — Z1212 Encounter for screening for malignant neoplasm of rectum: Secondary | ICD-10-CM | POA: Diagnosis not present

## 2017-07-18 DIAGNOSIS — Z6821 Body mass index (BMI) 21.0-21.9, adult: Secondary | ICD-10-CM | POA: Diagnosis not present

## 2017-07-25 ENCOUNTER — Ambulatory Visit
Admission: RE | Admit: 2017-07-25 | Discharge: 2017-07-25 | Disposition: A | Discharge status: Home / Self Care | Payer: Medicare Other | Source: Ambulatory Visit | Attending: Family Medicine | Admitting: Family Medicine

## 2017-07-25 DIAGNOSIS — Z1231 Encounter for screening mammogram for malignant neoplasm of breast: Secondary | ICD-10-CM

## 2017-09-04 ENCOUNTER — Telehealth: Payer: Self-pay | Admitting: Family Medicine

## 2017-09-04 ENCOUNTER — Encounter: Payer: Self-pay | Admitting: Family Medicine

## 2017-09-04 ENCOUNTER — Ambulatory Visit (INDEPENDENT_AMBULATORY_CARE_PROVIDER_SITE_OTHER): Payer: Medicare Other | Admitting: Family Medicine

## 2017-09-04 VITALS — BP 110/70 | HR 75 | Ht <= 58 in | Wt 108.8 lb

## 2017-09-04 DIAGNOSIS — M810 Age-related osteoporosis without current pathological fracture: Secondary | ICD-10-CM | POA: Insufficient documentation

## 2017-09-04 DIAGNOSIS — E039 Hypothyroidism, unspecified: Secondary | ICD-10-CM

## 2017-09-04 NOTE — Telephone Encounter (Signed)
Mrs Martha Swanson would like for you to change the address in the system to [employer] on [address] the one that is on the AVS is in [place].   Thanks

## 2017-09-04 NOTE — Patient Instructions (Addendum)
Hypothyroidism Hypothyroidism is a disorder of the thyroid. The thyroid is a large gland that is located in the lower front of the neck. The thyroid releases hormones that control how the body works. With hypothyroidism, the thyroid does not make enough of these hormones. What are the causes? Causes of hypothyroidism may include:  Viral infections.  Pregnancy.  Your own defense system (immune system) attacking your thyroid.  Certain medicines.  Birth defects.  Past radiation treatments to your head or neck.  Past treatment with radioactive iodine.  Past surgical removal of part or all of your thyroid.  Problems with the gland that is located in the center of your brain (pituitary).  What are the signs or symptoms? Signs and symptoms of hypothyroidism may include:  Feeling as though you have no energy (lethargy).  Inability to tolerate cold.  Weight gain that is not explained by a change in diet or exercise habits.  Dry skin.  Coarse hair.  Menstrual irregularity.  Slowing of thought processes.  Constipation.  Sadness or depression.  How is this diagnosed? Your health care provider may diagnose hypothyroidism with blood tests and ultrasound tests. How is this treated? Hypothyroidism is treated with medicine that replaces the hormones that your body does not make. After you begin treatment, it may take several weeks for symptoms to go away. Follow these instructions at home:  Take medicines only as directed by your health care provider.  If you start taking any new medicines, tell your health care provider.  Keep all follow-up visits as directed by your health care provider. This is important. As your condition improves, your dosage needs may change. You will need to have blood tests regularly so that your health care provider can watch your condition. Contact a health care provider if:  Your symptoms do not get better with treatment.  You are taking thyroid  replacement medicine and: ? You sweat excessively. ? You have tremors. ? You feel anxious. ? You lose weight rapidly. ? You cannot tolerate heat. ? You have emotional swings. ? You have diarrhea. ? You feel weak. Get help right away if:  You develop chest pain.  You develop an irregular heartbeat.  You develop a rapid heartbeat. This information is not intended to replace advice given to you by your health care provider. Make sure you discuss any questions you have with your health care provider. Document Released: 06/04/2005 Document Revised: 11/10/2015 Document Reviewed: 10/20/2013 Elsevier Interactive Patient Education  2018 [employer].  Osteoporosis Osteoporosis is the thinning and loss of density in the bones. Osteoporosis makes the bones more brittle, fragile, and likely to break (fracture). Over time, osteoporosis can cause the bones to become so weak that they fracture after a simple fall. The bones most likely to fracture are the bones in the hip, wrist, and spine. What are the causes? The exact cause is not known. What increases the risk? Anyone can develop osteoporosis. You may be at greater risk if you have a family history of the condition or have poor nutrition. You may also have a higher risk if you are:  Female.  48 years old or older.  A smoker.  Not physically active.  White or Asian.  Slender.  What are the signs or symptoms? A fracture might be the first sign of the disease, especially if it results from a fall or injury that would not usually cause a bone to break. Other signs and symptoms include:  Low back and neck pain.  Stooped posture.  Height loss.  How is this diagnosed? To make a diagnosis, your health care provider may:  Take a medical history.  Perform a physical exam.  Order tests, such as: ? A bone mineral density test. ? A dual-energy X-ray absorptiometry test.  How is this treated? The goal of osteoporosis treatment is  to strengthen your bones to reduce your risk of a fracture. Treatment may involve:  Making lifestyle changes, such as: ? Eating a diet rich in calcium. ? Doing weight-bearing and muscle-strengthening exercises. ? Stopping tobacco use. ? Limiting alcohol intake.  Taking medicine to slow the process of bone loss or to increase bone density.  Monitoring your levels of calcium and vitamin D.  Follow these instructions at home:  Include calcium and vitamin D in your diet. Calcium is important for bone health, and vitamin D helps the body absorb calcium.  Perform weight-bearing and muscle-strengthening exercises as directed by your health care provider.  Do not use any tobacco products, including cigarettes, chewing tobacco, and electronic cigarettes. If you need help quitting, ask your health care provider.  Limit your alcohol intake.  Take medicines only as directed by your health care provider.  Keep all follow-up visits as directed by your health care provider. This is important.  Take precautions at home to lower your risk of falling, such as: ? Keeping rooms well lit and clutter free. ? Installing safety rails on stairs. ? Using rubber mats in the bathroom and other areas that are often wet or slippery. Get help right away if: You fall or injure yourself. This information is not intended to replace advice given to you by your health care provider. Make sure you discuss any questions you have with your health care provider. Document Released: 03/14/2005 Document Revised: 11/07/2015 Document Reviewed: 11/12/2013 Elsevier Interactive Patient Education  [employer].

## 2017-09-04 NOTE — Telephone Encounter (Signed)
Done, thanks

## 2017-09-04 NOTE — Progress Notes (Signed)
   Subjective:  Patient ID: Martha Swanson, female    DOB: 1946-05-07  Age: 72 y.o. MRN: [id number]  CC: New Patient (Initial Visit)   HPI Martha Swanson presents for establishment of care and for follow-up of her hypothyroidism and osteoporosis.  Her TSH levels have been stable for years on her current dose of thyroid medication.  She had a DEXA scan 5 years ago that did show osteoporosis.  She has been taking Fosamax weekly without Os-Cal.  She has not had a pathological fracture.  She lives with lives with her husband and retired from an endoscopy lab and reads from [employer].  She is lived to see her grandchildren and great-grandchildren.  She is active with her husband and they walk on a daily basis.  She does not smoke or use illicit drugs.  She drinks alcohol on occasion.  She is status post TAH for a what large fibroid.  She has yearly mammograms.  Colonoscopy back in 2014 was normal and she was told to follow-up in 10 years.  History Martha Swanson has a past medical history of Takotsubo cardiomyopathy and Unspecified hypothyroidism.   She has a past surgical history that includes Abdominal hysterectomy; Colonoscopy; Thyroidectomy (1997); Tonsillectomy; and Colonoscopy (N/A, 04/01/2013).   Her family history includes Heart attack (age of onset: 83) in her father.She reports that  has never smoked. she has never used smokeless tobacco. She reports that she drinks alcohol. She reports that she does not use drugs.  Outpatient Medications Prior to Visit  Medication Sig Dispense Refill  . alendronate (FOSAMAX) 70 MG tablet     . calcium citrate-vitamin D (CITRACAL+D) 315-200 MG-UNIT per tablet Take 1 tablet by mouth daily.      [name] levothyroxine (SYNTHROID, LEVOTHROID) 50 MCG tablet Take 1 tablet by mouth Daily.     No facility-administered medications prior to visit.     ROS Review of Systems  Constitutional: Negative.   HENT: Negative.   Eyes: Negative.   Respiratory: Negative.     Cardiovascular: Negative.   Gastrointestinal: Negative.   Endocrine: Negative for cold intolerance and heat intolerance.  Genitourinary: Negative.   Musculoskeletal: Negative for gait problem and myalgias.  Skin: Negative for pallor and wound.  Allergic/Immunologic: Negative for immunocompromised state.  Neurological: Negative for weakness and headaches.  Hematological: Does not bruise/bleed easily.  Psychiatric/Behavioral: Negative.     Objective:  BP 110/70 (BP Location: Left Arm, Patient Position: Sitting, Cuff Size: Normal)   Pulse 75   Ht 4\' 10"  (1.473 m)   Wt 108 lb 12.8 oz (49.4 kg)   BMI 22.74 kg/m   Physical Exam  Constitutional: She appears well-developed and well-nourished. No distress.  Skin: She is not diaphoretic.      Assessment & Plan:   Martha Swanson was seen today for new patient (initial visit).  Diagnoses and all orders for this visit:  Osteoporosis without current pathological fracture, unspecified osteoporosis type -     DG Bone Density; Future  Acquired hypothyroidism   I am having Martha Swanson maintain her levothyroxine, calcium citrate-vitamin D, and alendronate.  No orders of the defined types were placed in this encounter.    Follow-up: Return in about 3 months (around 12/05/2017).  Libby Maw, MD

## 2017-09-05 ENCOUNTER — Encounter: Payer: Self-pay | Admitting: Family Medicine

## 2017-09-06 ENCOUNTER — Encounter: Payer: Self-pay | Admitting: Family Medicine

## 2017-09-12 ENCOUNTER — Inpatient Hospital Stay: Admission: RE | Admit: 2017-09-12 | Payer: Medicare Other | Source: Ambulatory Visit

## 2017-11-28 ENCOUNTER — Ambulatory Visit (INDEPENDENT_AMBULATORY_CARE_PROVIDER_SITE_OTHER)
Admission: RE | Admit: 2017-11-28 | Discharge: 2017-11-28 | Disposition: A | Discharge status: Home / Self Care | Payer: Medicare Other | Source: Ambulatory Visit | Attending: Family Medicine | Admitting: Family Medicine

## 2017-11-28 DIAGNOSIS — M810 Age-related osteoporosis without current pathological fracture: Secondary | ICD-10-CM

## 2017-12-05 ENCOUNTER — Encounter: Payer: Self-pay | Admitting: Family Medicine

## 2017-12-05 ENCOUNTER — Ambulatory Visit: Payer: Medicare Other | Admitting: Family Medicine

## 2017-12-05 ENCOUNTER — Ambulatory Visit (INDEPENDENT_AMBULATORY_CARE_PROVIDER_SITE_OTHER): Payer: Medicare Other | Admitting: Family Medicine

## 2017-12-05 VITALS — BP 120/72 | HR 75 | Temp 98.3°F | Ht <= 58 in | Wt 107.5 lb

## 2017-12-05 DIAGNOSIS — E559 Vitamin D deficiency, unspecified: Secondary | ICD-10-CM | POA: Diagnosis not present

## 2017-12-05 DIAGNOSIS — M810 Age-related osteoporosis without current pathological fracture: Secondary | ICD-10-CM | POA: Diagnosis not present

## 2017-12-05 LAB — COMPREHENSIVE METABOLIC PANEL
ALK PHOS: 61 U/L (ref 39–117)
ALT: 11 U/L (ref 0–35)
AST: 13 U/L (ref 0–37)
Albumin: 4.1 g/dL (ref 3.5–5.2)
BILIRUBIN TOTAL: 0.6 mg/dL (ref 0.2–1.2)
BUN: 10 mg/dL (ref 6–23)
CO2: 28 mEq/L (ref 19–32)
Calcium: 9.5 mg/dL (ref 8.4–10.5)
Chloride: 106 mEq/L (ref 96–112)
Creatinine, Ser: 0.87 mg/dL (ref 0.40–1.20)
GFR: 68 mL/min (ref >60.00)
GLUCOSE: 92 mg/dL (ref 70–99)
Potassium: 4.3 mEq/L (ref 3.5–5.1)
Sodium: 140 mEq/L (ref 135–145)
TOTAL PROTEIN: 6.6 g/dL (ref 6.0–8.3)

## 2017-12-05 LAB — VITAMIN D 25 HYDROXY (VIT D DEFICIENCY, FRACTURES): VITD: 24.57 ng/mL — AB (ref 30.00–100.00)

## 2017-12-05 MED ORDER — VITAMIN D (ERGOCALCIFEROL) 1.25 MG (50000 UNIT) PO CAPS: 50000.0000 [IU] | ORAL_CAPSULE | ORAL | 2 refills | Status: DC

## 2017-12-05 MED ORDER — CALCIUM CARBONATE 600 MG PO TABS: 600.0000 mg | ORAL_TABLET | Freq: Two times a day (BID) | ORAL | 12 refills | Status: AC

## 2017-12-05 MED ORDER — CALCIUM CARBONATE-VITAMIN D 500-200 MG-UNIT PO TABS: 1.0000 | ORAL_TABLET | Freq: Two times a day (BID) | ORAL | 1 refills | Status: DC

## 2017-12-05 MED ORDER — ALENDRONATE SODIUM 70 MG PO TABS: 70.0000 mg | ORAL_TABLET | ORAL | 2 refills | Status: DC

## 2017-12-05 NOTE — Progress Notes (Addendum)
Subjective:  Patient ID: Martha Swanson, female    DOB: [birth year]  Age: 72 y.o. MRN: [id number]  CC: Follow-up   HPI Martha Swanson presents for follow-up of her osteoporosis and recent DEXA scan that did given her of T score of -3.2 in the distal radius.  She has been taking Fosamax weekly regularly since 2015.  She admits that she is not always compliant with her calcium and vitamin D supplements.  She does consume a lot of calcium in the diet with dairy products and vegetables containing calcium.  She does reck exercise regularly by walking.  She has never smoked.  She has never had an osteoporotic fracture to her knowledge.  Outpatient Medications Prior to Visit  Medication Sig Dispense Refill  . levothyroxine (SYNTHROID, LEVOTHROID) 50 MCG tablet Take 1 tablet by mouth Daily.    [name] alendronate (FOSAMAX) 70 MG tablet     . calcium citrate-vitamin D (CITRACAL+D) 315-200 MG-UNIT per tablet Take 1 tablet by mouth daily.       No facility-administered medications prior to visit.     ROS Review of Systems  Constitutional: Negative for chills, fatigue, fever and unexpected weight change.  HENT: Negative.   Eyes: Negative.   Respiratory: Negative.   Gastrointestinal: Negative.   Endocrine: Negative for cold intolerance and heat intolerance.  Genitourinary: Negative.   Musculoskeletal: Negative for arthralgias, back pain and gait problem.  Skin: Negative.   Allergic/Immunologic: Negative for immunocompromised state.  Neurological: Negative for weakness and numbness.  Hematological: Does not bruise/bleed easily.  Psychiatric/Behavioral: Negative.     Objective:  BP 120/72   Pulse 75   Temp 98.3 F (36.8 C)   Ht 4\' 10"  (1.473 m)   Wt 107 lb 8 oz (48.8 kg)   SpO2 98%   BMI 22.47 kg/m   BP Readings from Last 3 Encounters:  12/05/17 120/72  09/04/17 110/70  04/01/13 133/69    Wt Readings from Last 3 Encounters:  12/05/17 107 lb 8 oz (48.8 kg)  09/04/17 108 lb 12.8  oz (49.4 kg)  04/01/13 106 lb (48.1 kg)    Physical Exam  Lab Results  Component Value Date   WBC 3.5 06/30/2015   HGB 14.1 06/30/2015   HCT 42 06/30/2015   PLT 230 06/30/2015   GLUCOSE 92 12/05/2017   CHOL 177 06/30/2015   TRIG 121 06/30/2015   HDL 62 06/30/2015   LDLCALC 91 06/30/2015   ALT 11 12/05/2017   AST 13 12/05/2017   NA 140 12/05/2017   K 4.3 12/05/2017   CL 106 12/05/2017   CREATININE 0.87 12/05/2017   BUN 10 12/05/2017   CO2 28 12/05/2017   TSH 1.25 06/30/2015    Dg Bone Density  Result Date: 12/01/2017 Date of study: 11/28/17 Exam: DUAL X-RAY ABSORPTIOMETRY (DXA) FOR BONE MINERAL DENSITY (BMD) Instrument: [employer] [facility] Provider: PCP Indication: screening for osteoporosis Comparison: none (please note that it is not possible to compare data from different instruments) Clinical data: Pt is a 71 y.o. female without history of fracture. (taking Fosamax) Results:  Lumbar spine L1-L4 Femoral neck (FN) 33% distal radius T-score -3.2 RFN: -3.3 LFN: -3.1 n/a Change in BMD from previous DXA test (%) n/a n/a n/a (*) statistically significant Assessment: Patient has OSTEOPOROSIS according to the [facility] classification for osteoporosis (see below). Fracture risk: high Comments: the technical quality of the study is good  Scoliosis may falsely elevate the spine score. Evaluation for secondary causes should be considered  if clinically indicated. Recommend optimizing calcium (1200 mg/day) and vitamin D (800 IU/day). Followup: Repeat BMD is appropriate after 2 years or after 1-2 years if starting treatment. WHO criteria for diagnosis of osteoporosis in postmenopausal women and in men 63 y/o or older: - normal: T-score -1.0 to + 1.0 - osteopenia/low bone density: T-score between -2.5 and -1.0 - osteoporosis: T-score below -2.5 - severe osteoporosis: T-score below -2.5 with history of fragility fracture Note: although not part of the WHO classification, the presence of a  fragility fracture, regardless of the T-score, should be considered diagnostic of osteoporosis, provided other causes for the fracture have been excluded. Treatment: The National Osteoporosis Foundation recommends that treatment be considered in postmenopausal women and men age 89 or older with: 1. Hip or vertebral (clinical or morphometric) fracture 2. T-score of - 2.5 or lower at the spine or hip 3. 10-year fracture probability by FRAX of at least 20% for a major osteoporotic fracture and 3% for a hip fracture [name]    Assessment & Plan:   Dala was seen today for follow-up.  Diagnoses and all orders for this visit:  Age-related osteoporosis without current pathological fracture -     alendronate (FOSAMAX) 70 MG tablet; Take 1 tablet (70 mg total) by mouth once a week. -     Discontinue: calcium-vitamin D (OSCAL WITH D) 500-200 MG-UNIT tablet; Take 1 tablet by mouth 2 (two) times daily. -     VITAMIN D 25 Hydroxy (Vit-D Deficiency, Fractures) -     Comprehensive metabolic panel -     calcium carbonate (OS-CAL) 600 MG TABS tablet; Take 1 tablet (600 mg total) by mouth 2 (two) times daily with a meal.  Vitamin D deficiency -     Vitamin D, Ergocalciferol, (DRISDOL) 50000 units CAPS capsule; Take 1 capsule (50,000 Units total) by mouth every 7 (seven) days.   I have discontinued [name]'s calcium citrate-vitamin D and calcium-vitamin D. I have also changed her alendronate. Additionally, I am having her start on Vitamin D (Ergocalciferol) and calcium carbonate. Lastly, I am having her maintain her levothyroxine.  Meds ordered this encounter  Medications  . alendronate (FOSAMAX) 70 MG tablet    Sig: Take 1 tablet (70 mg total) by mouth once a week.    Dispense:  12 tablet    Refill:  2  . DISCONTD: calcium-vitamin D (OSCAL WITH D) 500-200 MG-UNIT tablet    Sig: Take 1 tablet by mouth 2 (two) times daily.    Dispense:  180 tablet    Refill:  1  . Vitamin D,  Ergocalciferol, (DRISDOL) 50000 units CAPS capsule    Sig: Take 1 capsule (50,000 Units total) by mouth every 7 (seven) days.    Dispense:  15 capsule    Refill:  2  . calcium carbonate (OS-CAL) 600 MG TABS tablet    Sig: Take 1 tablet (600 mg total) by mouth 2 (two) times daily with a meal.    Dispense:  60 tablet    Refill:  12     Follow-up: Return in about 3 months (around 03/07/2018).  Libby Maw, MD

## 2017-12-05 NOTE — Patient Instructions (Addendum)
Osteoporosis Osteoporosis is the thinning and loss of density in the bones. Osteoporosis makes the bones more brittle, fragile, and likely to break (fracture). Over time, osteoporosis can cause the bones to become so weak that they fracture after a simple fall. The bones most likely to fracture are the bones in the hip, wrist, and spine. What are the causes? The exact cause is not known. What increases the risk? Anyone can develop osteoporosis. You may be at greater risk if you have a family history of the condition or have poor nutrition. You may also have a higher risk if you are:  Female.  43 years old or older.  A smoker.  Not physically active.  White or Asian.  Slender.  What are the signs or symptoms? A fracture might be the first sign of the disease, especially if it results from a fall or injury that would not usually cause a bone to break. Other signs and symptoms include:  Low back and neck pain.  Stooped posture.  Height loss.  How is this diagnosed? To make a diagnosis, your health care provider may:  Take a medical history.  Perform a physical exam.  Order tests, such as: ? A bone mineral density test. ? A dual-energy X-ray absorptiometry test.  How is this treated? The goal of osteoporosis treatment is to strengthen your bones to reduce your risk of a fracture. Treatment may involve:  Making lifestyle changes, such as: ? Eating a diet rich in calcium. ? Doing weight-bearing and muscle-strengthening exercises. ? Stopping tobacco use. ? Limiting alcohol intake.  Taking medicine to slow the process of bone loss or to increase bone density.  Monitoring your levels of calcium and vitamin D.  Follow these instructions at home:  Include calcium and vitamin D in your diet. Calcium is important for bone health, and vitamin D helps the body absorb calcium.  Perform weight-bearing and muscle-strengthening exercises as directed by your health care  provider.  Do not use any tobacco products, including cigarettes, chewing tobacco, and electronic cigarettes. If you need help quitting, ask your health care provider.  Limit your alcohol intake.  Take medicines only as directed by your health care provider.  Keep all follow-up visits as directed by your health care provider. This is important.  Take precautions at home to lower your risk of falling, such as: ? Keeping rooms well lit and clutter free. ? Installing safety rails on stairs. ? Using rubber mats in the bathroom and other areas that are often wet or slippery. Get help right away if: You fall or injure yourself. This information is not intended to replace advice given to you by your health care provider. Make sure you discuss any questions you have with your health care provider. Document Released: 03/14/2005 Document Revised: 11/07/2015 Document Reviewed: 11/12/2013 Elsevier Interactive Patient Education  2018 [place] in the Home Falls can cause injuries and can affect people from all age groups. There are many simple things that you can do to make your home safe and to help prevent falls. What can I do on the outside of my home?  Regularly repair the edges of walkways and driveways and fix any cracks.  Remove high doorway thresholds.  Trim any shrubbery on the main path into your home.  Use bright outdoor lighting.  Clear walkways of debris and clutter, including tools and rocks.  Regularly check that handrails are securely fastened and in good repair. Both sides of any steps should  have handrails.  Install guardrails along the edges of any raised decks or porches.  Have leaves, snow, and ice cleared regularly.  Use sand or salt on walkways during winter months.  In the garage, clean up any spills right away, including grease or oil spills. What can I do in the bathroom?  Use night lights.  Install grab bars by the toilet and in the tub  and shower. Do not use towel bars as grab bars.  Use non-skid mats or decals on the floor of the tub or shower.  If you need to sit down while you are in the shower, use a plastic, non-slip stool.  Keep the floor dry. Immediately clean up any water that spills on the floor.  Remove soap buildup in the tub or shower on a regular basis.  Attach bath mats securely with double-sided non-slip rug tape.  Remove throw rugs and other tripping hazards from the floor. What can I do in the bedroom?  Use night lights.  Make sure that a bedside light is easy to reach.  Do not use oversized bedding that drapes onto the floor.  Have a firm chair that has side arms to use for getting dressed.  Remove throw rugs and other tripping hazards from the floor. What can I do in the kitchen?  Clean up any spills right away.  Avoid walking on wet floors.  Place frequently used items in easy-to-reach places.  If you need to reach for something above you, use a sturdy step stool that has a grab bar.  Keep electrical cables out of the way.  Do not use floor polish or wax that makes floors slippery. If you have to use wax, make sure that it is non-skid floor wax.  Remove throw rugs and other tripping hazards from the floor. What can I do in the stairways?  Do not leave any items on the stairs.  Make sure that there are handrails on both sides of the stairs. Fix handrails that are broken or loose. Make sure that handrails are as long as the stairways.  Check any carpeting to make sure that it is firmly attached to the stairs. Fix any carpet that is loose or worn.  Avoid having throw rugs at the top or bottom of stairways, or secure the rugs with carpet tape to prevent them from moving.  Make sure that you have a light switch at the top of the stairs and the bottom of the stairs. If you do not have them, have them installed. What are some other fall prevention tips?  Wear closed-toe shoes that  fit well and support your feet. Wear shoes that have rubber soles or low heels.  When you use a stepladder, make sure that it is completely opened and that the sides are firmly locked. Have someone hold the ladder while you are using it. Do not climb a closed stepladder.  Add color or contrast paint or tape to grab bars and handrails in your home. Place contrasting color strips on the first and last steps.  Use mobility aids as needed, such as canes, walkers, scooters, and crutches.  Turn on lights if it is dark. Replace any light bulbs that burn out.  Set up furniture so that there are clear paths. Keep the furniture in the same spot.  Fix any uneven floor surfaces.  Choose a carpet design that does not hide the edge of steps of a stairway.  Be aware of any and all pets.  Review your medicines with your healthcare provider. Some medicines can cause dizziness or changes in blood pressure, which increase your risk of falling. Talk with your health care provider about other ways that you can decrease your risk of falls. This may include working with a physical therapist or trainer to improve your strength, balance, and endurance. This information is not intended to replace advice given to you by your health care provider. Make sure you discuss any questions you have with your health care provider. Document Released: 05/25/2002 Document Revised: 11/01/2015 Document Reviewed: 07/09/2014 Elsevier Interactive Patient Education  2018 [place] Prevention in the Home Falls can cause injuries and can affect people from all age groups. There are many simple things that you can do to make your home safe and to help prevent falls. What can I do on the outside of my home?  Regularly repair the edges of walkways and driveways and fix any cracks.  Remove high doorway thresholds.  Trim any shrubbery on the main path into your home.  Use bright outdoor lighting.  Clear walkways of debris  and clutter, including tools and rocks.  Regularly check that handrails are securely fastened and in good repair. Both sides of any steps should have handrails.  Install guardrails along the edges of any raised decks or porches.  Have leaves, snow, and ice cleared regularly.  Use sand or salt on walkways during winter months.  In the garage, clean up any spills right away, including grease or oil spills. What can I do in the bathroom?  Use night lights.  Install grab bars by the toilet and in the tub and shower. Do not use towel bars as grab bars.  Use non-skid mats or decals on the floor of the tub or shower.  If you need to sit down while you are in the shower, use a plastic, non-slip stool.  Keep the floor dry. Immediately clean up any water that spills on the floor.  Remove soap buildup in the tub or shower on a regular basis.  Attach bath mats securely with double-sided non-slip rug tape.  Remove throw rugs and other tripping hazards from the floor. What can I do in the bedroom?  Use night lights.  Make sure that a bedside light is easy to reach.  Do not use oversized bedding that drapes onto the floor.  Have a firm chair that has side arms to use for getting dressed.  Remove throw rugs and other tripping hazards from the floor. What can I do in the kitchen?  Clean up any spills right away.  Avoid walking on wet floors.  Place frequently used items in easy-to-reach places.  If you need to reach for something above you, use a sturdy step stool that has a grab bar.  Keep electrical cables out of the way.  Do not use floor polish or wax that makes floors slippery. If you have to use wax, make sure that it is non-skid floor wax.  Remove throw rugs and other tripping hazards from the floor. What can I do in the stairways?  Do not leave any items on the stairs.  Make sure that there are handrails on both sides of the stairs. Fix handrails that are broken or  loose. Make sure that handrails are as long as the stairways.  Check any carpeting to make sure that it is firmly attached to the stairs. Fix any carpet that is loose or worn.  Avoid having throw rugs at  the top or bottom of stairways, or secure the rugs with carpet tape to prevent them from moving.  Make sure that you have a light switch at the top of the stairs and the bottom of the stairs. If you do not have them, have them installed. What are some other fall prevention tips?  Wear closed-toe shoes that fit well and support your feet. Wear shoes that have rubber soles or low heels.  When you use a stepladder, make sure that it is completely opened and that the sides are firmly locked. Have someone hold the ladder while you are using it. Do not climb a closed stepladder.  Add color or contrast paint or tape to grab bars and handrails in your home. Place contrasting color strips on the first and last steps.  Use mobility aids as needed, such as canes, walkers, scooters, and crutches.  Turn on lights if it is dark. Replace any light bulbs that burn out.  Set up furniture so that there are clear paths. Keep the furniture in the same spot.  Fix any uneven floor surfaces.  Choose a carpet design that does not hide the edge of steps of a stairway.  Be aware of any and all pets.  Review your medicines with your healthcare provider. Some medicines can cause dizziness or changes in blood pressure, which increase your risk of falling. Talk with your health care provider about other ways that you can decrease your risk of falls. This may include working with a physical therapist or trainer to improve your strength, balance, and endurance. This information is not intended to replace advice given to you by your health care provider. Make sure you discuss any questions you have with your health care provider. Document Released: 05/25/2002 Document Revised: 11/01/2015 Document Reviewed:  07/09/2014 Elsevier Interactive Patient Education  2018 [place] Prevention in the Home Falls can cause injuries and can affect people from all age groups. There are many simple things that you can do to make your home safe and to help prevent falls. What can I do on the outside of my home?  Regularly repair the edges of walkways and driveways and fix any cracks.  Remove high doorway thresholds.  Trim any shrubbery on the main path into your home.  Use bright outdoor lighting.  Clear walkways of debris and clutter, including tools and rocks.  Regularly check that handrails are securely fastened and in good repair. Both sides of any steps should have handrails.  Install guardrails along the edges of any raised decks or porches.  Have leaves, snow, and ice cleared regularly.  Use sand or salt on walkways during winter months.  In the garage, clean up any spills right away, including grease or oil spills. What can I do in the bathroom?  Use night lights.  Install grab bars by the toilet and in the tub and shower. Do not use towel bars as grab bars.  Use non-skid mats or decals on the floor of the tub or shower.  If you need to sit down while you are in the shower, use a plastic, non-slip stool.  Keep the floor dry. Immediately clean up any water that spills on the floor.  Remove soap buildup in the tub or shower on a regular basis.  Attach bath mats securely with double-sided non-slip rug tape.  Remove throw rugs and other tripping hazards from the floor. What can I do in the bedroom?  Use night lights.  Make sure  that a bedside light is easy to reach.  Do not use oversized bedding that drapes onto the floor.  Have a firm chair that has side arms to use for getting dressed.  Remove throw rugs and other tripping hazards from the floor. What can I do in the kitchen?  Clean up any spills right away.  Avoid walking on wet floors.  Place frequently used  items in easy-to-reach places.  If you need to reach for something above you, use a sturdy step stool that has a grab bar.  Keep electrical cables out of the way.  Do not use floor polish or wax that makes floors slippery. If you have to use wax, make sure that it is non-skid floor wax.  Remove throw rugs and other tripping hazards from the floor. What can I do in the stairways?  Do not leave any items on the stairs.  Make sure that there are handrails on both sides of the stairs. Fix handrails that are broken or loose. Make sure that handrails are as long as the stairways.  Check any carpeting to make sure that it is firmly attached to the stairs. Fix any carpet that is loose or worn.  Avoid having throw rugs at the top or bottom of stairways, or secure the rugs with carpet tape to prevent them from moving.  Make sure that you have a light switch at the top of the stairs and the bottom of the stairs. If you do not have them, have them installed. What are some other fall prevention tips?  Wear closed-toe shoes that fit well and support your feet. Wear shoes that have rubber soles or low heels.  When you use a stepladder, make sure that it is completely opened and that the sides are firmly locked. Have someone hold the ladder while you are using it. Do not climb a closed stepladder.  Add color or contrast paint or tape to grab bars and handrails in your home. Place contrasting color strips on the first and last steps.  Use mobility aids as needed, such as canes, walkers, scooters, and crutches.  Turn on lights if it is dark. Replace any light bulbs that burn out.  Set up furniture so that there are clear paths. Keep the furniture in the same spot.  Fix any uneven floor surfaces.  Choose a carpet design that does not hide the edge of steps of a stairway.  Be aware of any and all pets.  Review your medicines with your healthcare provider. Some medicines can cause dizziness or  changes in blood pressure, which increase your risk of falling. Talk with your health care provider about other ways that you can decrease your risk of falls. This may include working with a physical therapist or trainer to improve your strength, balance, and endurance. This information is not intended to replace advice given to you by your health care provider. Make sure you discuss any questions you have with your health care provider. Document Released: 05/25/2002 Document Revised: 11/01/2015 Document Reviewed: 07/09/2014 Elsevier Interactive Patient Education  2018 [employer]. Denosumab injection What is this medicine? DENOSUMAB (den oh sue mab) slows bone breakdown. Prolia is used to treat osteoporosis in women after menopause and in men. Delton See is used to treat a high calcium level due to cancer and to prevent bone fractures and other bone problems caused by multiple myeloma or cancer bone metastases. Delton See is also used to treat giant cell tumor of the bone. This medicine  may be used for other purposes; ask your health care provider or pharmacist if you have questions. COMMON BRAND NAME(S): Prolia, XGEVA What should I tell my health care provider before I take this medicine? They need to know if you have any of these conditions: -dental disease -having surgery or tooth extraction -infection -kidney disease -low levels of calcium or Vitamin D in the blood -malnutrition -on hemodialysis -skin conditions or sensitivity -thyroid or parathyroid disease -an unusual reaction to denosumab, other medicines, foods, dyes, or preservatives -pregnant or trying to get pregnant -breast-feeding How should I use this medicine? This medicine is for injection under the skin. It is given by a health care professional in a hospital or clinic setting. If you are getting Prolia, a special MedGuide will be given to you by the pharmacist with each prescription and refill. Be sure to read this information  carefully each time. For Prolia, talk to your pediatrician regarding the use of this medicine in children. Special care may be needed. For Delton See, talk to your pediatrician regarding the use of this medicine in children. While this drug may be prescribed for children as young as 13 years for selected conditions, precautions do apply. Overdosage: If you think you have taken too much of this medicine contact a poison control center or emergency room at once. NOTE: This medicine is only for you. Do not share this medicine with others. What if I miss a dose? It is important not to miss your dose. Call your doctor or health care professional if you are unable to keep an appointment. What may interact with this medicine? Do not take this medicine with any of the following medications: -other medicines containing denosumab This medicine may also interact with the following medications: -medicines that lower your chance of fighting infection -steroid medicines like prednisone or cortisone This list may not describe all possible interactions. Give your health care provider a list of all the medicines, herbs, non-prescription drugs, or dietary supplements you use. Also tell them if you smoke, drink alcohol, or use illegal drugs. Some items may interact with your medicine. What should I watch for while using this medicine? Visit your doctor or health care professional for regular checks on your progress. Your doctor or health care professional may order blood tests and other tests to see how you are doing. Call your doctor or health care professional for advice if you get a fever, chills or sore throat, or other symptoms of a cold or flu. Do not treat yourself. This drug may decrease your body's ability to fight infection. Try to avoid being around people who are sick. You should make sure you get enough calcium and vitamin D while you are taking this medicine, unless your doctor tells you not to. Discuss the  foods you eat and the vitamins you take with your health care professional. See your dentist regularly. Brush and floss your teeth as directed. Before you have any dental work done, tell your dentist you are receiving this medicine. Do not become pregnant while taking this medicine or for 5 months after stopping it. Talk with your doctor or health care professional about your birth control options while taking this medicine. Women should inform their doctor if they wish to become pregnant or think they might be pregnant. There is a potential for serious side effects to an unborn child. Talk to your health care professional or pharmacist for more information. What side effects may I notice from receiving this medicine? Side  effects that you should report to your doctor or health care professional as soon as possible: -allergic reactions like skin rash, itching or hives, swelling of the face, lips, or tongue -bone pain -breathing problems -dizziness -jaw pain, especially after dental work -redness, blistering, peeling of the skin -signs and symptoms of infection like fever or chills; cough; sore throat; pain or trouble passing urine -signs of low calcium like fast heartbeat, muscle cramps or muscle pain; pain, tingling, numbness in the hands or feet; seizures -unusual bleeding or bruising -unusually weak or tired Side effects that usually do not require medical attention (report to your doctor or health care professional if they continue or are bothersome): -constipation -diarrhea -headache -joint pain -loss of appetite -muscle pain -runny nose -tiredness -upset stomach This list may not describe all possible side effects. Call your doctor for medical advice about side effects. You may report side effects to FDA at 1-800-FDA-1088. Where should I keep my medicine? This medicine is only given in a clinic, doctor's office, or other health care setting and will not be stored at home. NOTE: This  sheet is a summary. It may not cover all possible information. If you have questions about this medicine, talk to your doctor, pharmacist, or health care provider.  2018 Elsevier/[employer] (2016-06-26 19:17:21)

## 2017-12-05 NOTE — Addendum Note (Signed)
Addended by: Jon Billings on: 12/05/2017 01:31 PM   Modules accepted: Orders

## 2017-12-09 ENCOUNTER — Telehealth: Payer: Self-pay | Admitting: Family Medicine

## 2017-12-09 NOTE — Telephone Encounter (Signed)
I called and spoke with patient. Patient just wanted to make sure she wasn't taking too much vitamin d. Patient verbalized understanding of information below.

## 2017-12-09 NOTE — Telephone Encounter (Signed)
Copied from [place] [id number]. Topic: General - Other >> Dec 09, 2017  9:31 AM Lennox Solders wrote: Reason for CRM:pt is calling she is taking vit d once a week. Pt said md would like her to take calcium also. Pt would like to know if she should take vit d with calcium all in one

## 2017-12-09 NOTE — Telephone Encounter (Signed)
High dose weekly Vit D should be enough Vit D.  Take calcium 600mg  1 pill twice daily.

## 2017-12-10 ENCOUNTER — Encounter: Payer: Self-pay | Admitting: Family Medicine

## 2018-03-07 ENCOUNTER — Encounter: Payer: Self-pay | Admitting: Family Medicine

## 2018-03-07 ENCOUNTER — Ambulatory Visit: Payer: Medicare Other | Admitting: Family Medicine

## 2018-03-07 ENCOUNTER — Ambulatory Visit (INDEPENDENT_AMBULATORY_CARE_PROVIDER_SITE_OTHER): Payer: Medicare Other | Admitting: Family Medicine

## 2018-03-07 VITALS — BP 132/70 | HR 100 | Ht <= 58 in | Wt 106.4 lb

## 2018-03-07 DIAGNOSIS — E039 Hypothyroidism, unspecified: Secondary | ICD-10-CM | POA: Diagnosis not present

## 2018-03-07 DIAGNOSIS — E559 Vitamin D deficiency, unspecified: Secondary | ICD-10-CM

## 2018-03-07 DIAGNOSIS — R011 Cardiac murmur, unspecified: Secondary | ICD-10-CM

## 2018-03-07 LAB — TSH: TSH: 1.18 u[IU]/mL (ref 0.35–4.50)

## 2018-03-07 MED ORDER — LEVOTHYROXINE SODIUM 50 MCG PO TABS: 50.0000 ug | ORAL_TABLET | Freq: Every day | ORAL | 2 refills | Status: DC

## 2018-03-07 NOTE — Patient Instructions (Signed)
Rivaroxaban oral tablets °What is this medicine? °RIVAROXABAN (ri va ROX a ban) is an anticoagulant (blood thinner). It is used to treat blood clots in the lungs or in the veins. It is also used after knee or hip surgeries to prevent blood clots. It is also used to lower the chance of stroke in people with a medical condition called atrial fibrillation. °This medicine may be used for other purposes; ask your health care provider or pharmacist if you have questions. °COMMON BRAND NAME(S): Xarelto, Xarelto Starter Pack °What should I tell my health care provider before I take this medicine? °They need to know if you have any of these conditions: °-bleeding disorders °-bleeding in the brain °-blood in your stools (black or tarry stools) or if you have blood in your vomit °-history of stomach bleeding °-kidney disease °-liver disease °-low blood counts, like low white cell, platelet, or red cell counts °-recent or planned spinal or epidural procedure °-take medicines that treat or prevent blood clots °-an unusual or allergic reaction to rivaroxaban, other medicines, foods, dyes, or preservatives °-pregnant or trying to get pregnant °-breast-feeding °How should I use this medicine? °Take this medicine by mouth with a glass of water. Follow the directions on the prescription label. Take your medicine at regular intervals. Do not take it more often than directed. Do not stop taking except on your doctor's advice. Stopping this medicine may increase your risk of a blood clot. Be sure to refill your prescription before you run out of medicine. °If you are taking this medicine after hip or knee replacement surgery, take it with or without food. If you are taking this medicine for atrial fibrillation, take it with your evening meal. If you are taking this medicine to treat blood clots, take it with food at the same time each day. If you are unable to swallow your tablet, you may crush the tablet and mix it in applesauce. Then,  immediately eat the applesauce. You should eat more food right after you eat the applesauce containing the crushed tablet. °Talk to your pediatrician regarding the use of this medicine in children. Special care may be needed. °Overdosage: If you think you have taken too much of this medicine contact a poison control center or emergency room at once. °NOTE: This medicine is only for you. Do not share this medicine with others. °What if I miss a dose? °If you take your medicine once a day and miss a dose, take the missed dose as soon as you remember. If you take your medicine twice a day and miss a dose, take the missed dose immediately. In this instance, 2 tablets may be taken at the same time. The next day you should take 1 tablet twice a day as directed. °What may interact with this medicine? °Do not take this medicine with any of the following medications: °-defibrotide °This medicine may also interact with the following medications: °-aspirin and aspirin-like medicines °-certain antibiotics like erythromycin, azithromycin, and clarithromycin °-certain medicines for fungal infections like ketoconazole and itraconazole °-certain medicines for irregular heart beat like amiodarone, quinidine, dronedarone °-certain medicines for seizures like carbamazepine, phenytoin °-certain medicines that treat or prevent blood clots like warfarin, enoxaparin, and dalteparin °-conivaptan °-diltiazem °-felodipine °-indinavir °-lopinavir; ritonavir °-NSAIDS, medicines for pain and inflammation, like ibuprofen or naproxen °-ranolazine °-rifampin °-ritonavir °-SNRIs, medicines for depression, like desvenlafaxine, duloxetine, levomilnacipran, venlafaxine °-SSRIs, medicines for depression, like citalopram, escitalopram, fluoxetine, fluvoxamine, paroxetine, sertraline °-St. John's wort °-verapamil °This list may not describe all   possible interactions. Give your health care provider a list of all the medicines, herbs, non-prescription  drugs, or dietary supplements you use. Also tell them if you smoke, drink alcohol, or use illegal drugs. Some items may interact with your medicine. °What should I watch for while using this medicine? °Visit your doctor or health care professional for regular checks on your progress. °Notify your doctor or health care professional and seek emergency treatment if you develop breathing problems; changes in vision; chest pain; severe, sudden headache; pain, swelling, warmth in the leg; trouble speaking; sudden numbness or weakness of the face, arm or leg. These can be signs that your condition has gotten worse. °If you are going to have surgery or other procedure, tell your doctor that you are taking this medicine. °What side effects may I notice from receiving this medicine? °Side effects that you should report to your doctor or health care professional as soon as possible: °-allergic reactions like skin rash, itching or hives, swelling of the face, lips, or tongue °-back pain °-redness, blistering, peeling or loosening of the skin, including inside the mouth °-signs and symptoms of bleeding such as bloody or black, tarry stools; red or dark-brown urine; spitting up blood or brown material that looks like coffee grounds; red spots on the skin; unusual bruising or bleeding from the eye, gums, or nose °Side effects that usually do not require medical attention (report to your doctor or health care professional if they continue or are bothersome): °-dizziness °-muscle pain °This list may not describe all possible side effects. Call your doctor for medical advice about side effects. You may report side effects to FDA at 1-800-FDA-1088. °Where should I keep my medicine? °Keep out of the reach of children. °Store at room temperature between 15 and 30 degrees C (59 and 86 degrees F). Throw away any unused medicine after the expiration date. °NOTE: This sheet is a summary. It may not cover all possible information. If you  have questions about this medicine, talk to your doctor, pharmacist, or health care provider. °© 2018 Elsevier/Gold Standard (2016-02-22 16:29:33) ° °

## 2018-03-07 NOTE — Progress Notes (Addendum)
Subjective:  Patient ID: Martha Swanson, female    DOB: 07-24-1945  Age: 72 y.o. MRN: [id number]  CC: Follow-up   HPI Martha Swanson presents for follow-up of her osteoporosis, hypothyroidism and vitamin D deficiency.  She is taking the Fosamax weekly without issue.  She is taking high-dose vitamin D and Os-Cal with vitamin D.  She is taking her levothyroxine each morning 1 hour prior to eating.  Past medical history of Takotsubo cardiomyopathy.  She she has history of heart murmur but has not had an echocardiogram in some time.  She has no shortness of breath or chest pain.  Outpatient Medications Prior to Visit  Medication Sig Dispense Refill  . alendronate (FOSAMAX) 70 MG tablet Take 1 tablet (70 mg total) by mouth once a week. 12 tablet 2  . calcium carbonate (OS-CAL) 600 MG TABS tablet Take 1 tablet (600 mg total) by mouth 2 (two) times daily with a meal. 60 tablet 12  . Vitamin D, Ergocalciferol, (DRISDOL) 50000 units CAPS capsule Take 1 capsule (50,000 Units total) by mouth every 7 (seven) days. 15 capsule 2  . levothyroxine (SYNTHROID, LEVOTHROID) 50 MCG tablet Take 1 tablet by mouth Daily.     No facility-administered medications prior to visit.     ROS Review of Systems  Constitutional: Negative for chills, diaphoresis, fatigue, fever and unexpected weight change.  HENT: Negative.   Eyes: Negative.   Respiratory: Negative for chest tightness and shortness of breath.   Cardiovascular: Negative for chest pain and palpitations.  Gastrointestinal: Negative.   Endocrine: Negative for cold intolerance and heat intolerance.  Genitourinary: Negative.   Musculoskeletal: Negative for arthralgias and myalgias.  Skin: Negative for pallor and rash.  Allergic/Immunologic: Negative for immunocompromised state.  Neurological: Negative for headaches.  Hematological: Does not bruise/bleed easily.  Psychiatric/Behavioral: Negative.     Objective:  BP 132/70   Pulse 100   Ht 4'  10" (1.473 m)   Wt 106 lb 6 oz (48.3 kg)   SpO2 99%   BMI 22.23 kg/m   BP Readings from Last 3 Encounters:  03/07/18 132/70  12/05/17 120/72  09/04/17 110/70    Wt Readings from Last 3 Encounters:  03/07/18 106 lb 6 oz (48.3 kg)  12/05/17 107 lb 8 oz (48.8 kg)  09/04/17 108 lb 12.8 oz (49.4 kg)    Physical Exam  Constitutional: She is oriented to person, place, and time. She appears well-developed and well-nourished. No distress.  HENT:  Head: Normocephalic and atraumatic.  Right Ear: External ear normal.  Left Ear: External ear normal.  Eyes: Right eye exhibits no discharge. Left eye exhibits no discharge. No scleral icterus.  Neck: No JVD present. No tracheal deviation present.  Cardiovascular: Normal rate and regular rhythm.  Murmur heard.  Systolic murmur is present with a grade of 2/6.  Diastolic murmur is present with a grade of 1/6. Pulmonary/Chest: Effort normal and breath sounds normal.  Abdominal: Bowel sounds are normal.  Neurological: She is alert and oriented to person, place, and time.  Skin: Skin is warm and dry. Capillary refill takes less than 2 seconds. She is not diaphoretic.  Psychiatric: She has a normal mood and affect. Her behavior is normal.   ? Diastolic murmur Lab Results  Component Value Date   WBC 3.5 06/30/2015   HGB 14.1 06/30/2015   HCT 42 06/30/2015   PLT 230 06/30/2015   GLUCOSE 92 12/05/2017   CHOL 177 06/30/2015   TRIG 121 06/30/2015  HDL 62 06/30/2015   LDLCALC 91 06/30/2015   ALT 11 12/05/2017   AST 13 12/05/2017   NA 140 12/05/2017   K 4.3 12/05/2017   CL 106 12/05/2017   CREATININE 0.87 12/05/2017   BUN 10 12/05/2017   CO2 28 12/05/2017   TSH 1.18 03/07/2018    Dg Bone Density  Result Date: 12/01/2017 Date of study: 11/28/17 Exam: DUAL X-RAY ABSORPTIOMETRY (DXA) FOR BONE MINERAL DENSITY (BMD) Instrument: [employer] Provider: PCP Indication: screening for osteoporosis Comparison: none (please note  that it is not possible to compare data from different instruments) Clinical data: Pt is a 72 y.o. female without history of fracture. (taking Fosamax) Results:  Lumbar spine L1-L4 Femoral neck (FN) 33% distal radius T-score -3.2 RFN: -3.3 LFN: -3.1 n/a Change in BMD from previous DXA test (%) n/a n/a n/a (*) statistically significant Assessment: Patient has OSTEOPOROSIS according to the [facility] classification for osteoporosis (see below). Fracture risk: high Comments: the technical quality of the study is good  Scoliosis may falsely elevate the spine score. Evaluation for secondary causes should be considered if clinically indicated. Recommend optimizing calcium (1200 mg/day) and vitamin D (800 IU/day). Followup: Repeat BMD is appropriate after 2 years or after 1-2 years if starting treatment. WHO criteria for diagnosis of osteoporosis in postmenopausal women and in men 42 y/o or older: - normal: T-score -1.0 to + 1.0 - osteopenia/low bone density: T-score between -2.5 and -1.0 - osteoporosis: T-score below -2.5 - severe osteoporosis: T-score below -2.5 with history of fragility fracture Note: although not part of the WHO classification, the presence of a fragility fracture, regardless of the T-score, should be considered diagnostic of osteoporosis, provided other causes for the fracture have been excluded. Treatment: The National Osteoporosis Foundation recommends that treatment be considered in postmenopausal women and men age 51 or older with: 1. Hip or vertebral (clinical or morphometric) fracture 2. T-score of - 2.5 or lower at the spine or hip 3. 10-year fracture probability by FRAX of at least 20% for a major osteoporotic fracture and 3% for a hip fracture [name]    Assessment & Plan:   Nevae was seen today for follow-up.  Diagnoses and all orders for this visit:  Acquired hypothyroidism -     TSH -     levothyroxine (SYNTHROID, LEVOTHROID) 50 MCG tablet; Take 1 tablet (50 mcg total) by mouth  daily before breakfast.  Vitamin D deficiency -     VITAMIN D 25 Hydroxy (Vit-D Deficiency, Fractures)  Heart murmur -     Ambulatory referral to Cardiology   I have changed Hassan Rowan C. Buick's levothyroxine. I am also having her maintain her alendronate, Vitamin D (Ergocalciferol), and calcium carbonate.  Meds ordered this encounter  Medications  . levothyroxine (SYNTHROID, LEVOTHROID) 50 MCG tablet    Sig: Take 1 tablet (50 mcg total) by mouth daily before breakfast.    Dispense:  100 tablet    Refill:  2     Follow-up: Return in about 6 months (around 09/05/2018).  Libby Maw, MD

## 2018-03-07 NOTE — Addendum Note (Signed)
Addended by: Jon Billings on: 03/07/2018 04:51 PM   Modules accepted: Orders

## 2018-03-08 LAB — VITAMIN D 25 HYDROXY (VIT D DEFICIENCY, FRACTURES): Vit D, 25-Hydroxy: 36 ng/mL (ref 30–100)

## 2018-03-18 ENCOUNTER — Encounter: Payer: Self-pay | Admitting: Internal Medicine

## 2018-03-18 ENCOUNTER — Ambulatory Visit (INDEPENDENT_AMBULATORY_CARE_PROVIDER_SITE_OTHER): Payer: Medicare Other | Admitting: Internal Medicine

## 2018-03-18 VITALS — BP 132/76 | HR 68 | Ht <= 58 in | Wt 107.0 lb

## 2018-03-18 DIAGNOSIS — I5181 Takotsubo syndrome: Secondary | ICD-10-CM | POA: Diagnosis not present

## 2018-03-18 DIAGNOSIS — R011 Cardiac murmur, unspecified: Secondary | ICD-10-CM

## 2018-03-18 NOTE — Patient Instructions (Addendum)
Your physician recommends that you schedule a follow-up appointment as needed with Dr. Hilty.  

## 2018-03-18 NOTE — Progress Notes (Signed)
OFFICE CONSULT NOTE  Chief Complaint:  Murmur  Primary Care Physician: Libby Maw, Martha Swanson  HPI:  Martha Swanson is a 72 y.o. female who is being seen today for the evaluation of murmur at the request of Libby Maw,*. This is a pleasant 72 year old female kindly referred for evaluation of murmur.  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  She underwent cardiac catheterization by Dr. Burt Knack which indicated no significant angiographic coronary disease.  LVEF at the time was 20 to 25% with apical ballooning and a hyperdynamic base, suggestive of Takatsubo cardiomyopathy.  She was subsequently seen in follow-up by Dr. Lutricia Feil in the office who apparently performed a bedside echo and reportedly the EF had normalized.  It was also noted she had moderate mitral regurgitation at cath with some dynamic LVOT obstruction.   She is now referred back for evaluation of murmur.  She is completely asymptomatic and just underwent physical exam.  She denies worsening shortness of breath, chest pain, fatigue, exercise intolerance or other symptoms.  There is a family history of heart disease in her father and uncles but she has no personal coronary disease.  In fact she only has history of osteopenia and hypothyroidism.  PMHx:  Past Medical History:  Diagnosis Date  . Heart disease   . Takotsubo cardiomyopathy     cardiac catheterization initial ejection fraction 20% no coronary artery disease ejection fraction improved after 5 days   . Unspecified hypothyroidism     Past Surgical History:  Procedure Laterality Date  . ABDOMINAL HYSTERECTOMY    . COLONOSCOPY    . COLONOSCOPY N/A 04/01/2013   Procedure: COLONOSCOPY;  Surgeon: Rogene Houston, Martha Swanson;  Location: [facility];  Service: Endoscopy;  Laterality: N/A;  830  . THYROIDECTOMY  1997  . TONSILLECTOMY      FAMHx:  Family History  Problem  Relation Age of Onset  . Heart attack Father 64  . Early death Father   . Arthritis Mother   . COPD Mother   . Hypertension Mother   . Arthritis Brother   . Asthma Brother   . Hypertension Brother   . Cancer Maternal Grandfather   . Arthritis Brother   . Colon cancer Neg Hx     SOCHx:   reports that she has never smoked. She has never used smokeless tobacco. She reports that she drinks alcohol. She reports that she does not use drugs.  ALLERGIES:  Allergies  Allergen Reactions  . Penicillins Anaphylaxis    ROS: Pertinent items noted in HPI and remainder of comprehensive ROS otherwise negative.  HOME MEDS: Current Outpatient Medications on File Prior to Visit  Medication Sig Dispense Refill  . alendronate (FOSAMAX) 70 MG tablet Take 1 tablet (70 mg total) by mouth once a week. 12 tablet 2  . calcium carbonate (OS-CAL) 600 MG TABS tablet Take 1 tablet (600 mg total) by mouth 2 (two) times daily with a meal. 60 tablet 12  . levothyroxine (SYNTHROID, LEVOTHROID) 50 MCG tablet Take 1 tablet (50 mcg total) by mouth daily before breakfast. 100 tablet 2  . Vitamin D, Ergocalciferol, (DRISDOL) 50000 units CAPS capsule Take 1 capsule (50,000 Units total) by mouth every 7 (seven) days. 15 capsule 2  . [DISCONTINUED] atorvastatin (LIPITOR) 10 MG tablet Take 1 tablet by mouth Daily.     No current facility-administered medications on file prior to visit.  LABS/IMAGING: No results found for this or any previous visit (from the past 48 hour(s)). No results found.  LIPID PANEL:    Component Value Date/Time   CHOL 177 06/30/2015   TRIG 121 06/30/2015   HDL 62 06/30/2015   CHOLHDL 3.8 02/13/2011 0128   VLDL 39 02/13/2011 0128   LDLCALC 91 06/30/2015    WEIGHTS: Wt Readings from Last 3 Encounters:  03/18/18 107 lb (48.5 kg)  03/07/18 106 lb 6 oz (48.3 kg)  12/05/17 107 lb 8 oz (48.8 kg)    VITALS: BP 132/76 (BP Location: Left Arm, Patient Position: Sitting, Cuff Size:  Normal)   Pulse 68   Ht 4\' 10"  (1.473 m)   Wt 107 lb (48.5 kg)   BMI 22.36 kg/m   EXAM: General appearance: alert and no distress Neck: no carotid bruit, no JVD and thyroid not enlarged, symmetric, no tenderness/mass/nodules Lungs: clear to auscultation bilaterally Heart: regular rate and rhythm, S1, S2 normal and systolic murmur: early systolic 2/6, blowing at apex Abdomen: soft, non-tender; bowel sounds normal; no masses,  no organomegaly Extremities: extremities normal, atraumatic, no cyanosis or edema Pulses: 2+ and symmetric Skin: Skin color, texture, turgor normal. No rashes or lesions Neurologic: Grossly normal Psych: Pleasant  EKG: Normal sinus rhythm nonspecific ST and T wave changes at 68- personally reviewed  ASSESSMENT: 1. Murmur 2. History of Takatsubo cardiomyopathy 3. Angiographically normal coronaries by cath in 2012  PLAN: 1.   Martha Swanson is asymptomatic with a soft systolic murmur likely mitral regurgitation.  She was noted to have moderate mitral regurgitation when she had cardiomyopathy however that improved by bedside echo according to Dr. to get with normalized EF in 2013.  Unfortunately I cannot review a report on that echo.  She has been asymptomatic since then.  I think we could follow the murmur clinically as it is soft and she remains asymptomatic.  There is a small risk she could have recurrent cardiomyopathy in the setting of significant stressors.  Some patients remain on low-dose beta-blocker to prevent this however data is lacking.  She is not interested in medication at this time but if she were to have some recurrence or require treatment for hypertension, I would recommend a beta-blocker such as carvedilol as a first choice.  Thanks again for the kind referral.  Follow-up with me as needed.  Martha Casino, Martha Swanson, [facility], [place] Director of the Advanced Lipid Disorders &  Cardiovascular Risk Reduction  Clinic Diplomate of the American Board of Clinical Lipidology Attending Cardiologist  Direct Dial: [phone]  Fax: [phone]  Website:  [email or web address] Capers Hagmann 03/18/2018, 9:32 AM

## 2018-04-14 ENCOUNTER — Encounter: Payer: Self-pay | Admitting: *Deleted

## 2018-04-14 NOTE — Congregational Nurse Program (Signed)
[id number]/spoke with Hassan Rowan via card concerning her mother and wishing her well and giving support. Deissy Guilbert,BSN,RN3,CCM,CN

## 2018-07-02 ENCOUNTER — Other Ambulatory Visit: Payer: Self-pay | Admitting: Family Medicine

## 2018-07-02 DIAGNOSIS — Z1231 Encounter for screening mammogram for malignant neoplasm of breast: Secondary | ICD-10-CM

## 2018-07-18 DIAGNOSIS — H2513 Age-related nuclear cataract, bilateral: Secondary | ICD-10-CM | POA: Diagnosis not present

## 2018-07-30 ENCOUNTER — Ambulatory Visit
Admission: RE | Admit: 2018-07-30 | Discharge: 2018-07-30 | Disposition: A | Discharge status: Home / Self Care | Payer: Medicare Other | Source: Ambulatory Visit | Attending: Family Medicine | Admitting: Family Medicine

## 2018-07-30 DIAGNOSIS — Z1231 Encounter for screening mammogram for malignant neoplasm of breast: Secondary | ICD-10-CM

## 2018-07-31 ENCOUNTER — Other Ambulatory Visit: Payer: Self-pay | Admitting: Family Medicine

## 2018-07-31 DIAGNOSIS — R928 Other abnormal and inconclusive findings on diagnostic imaging of breast: Secondary | ICD-10-CM

## 2018-08-08 ENCOUNTER — Ambulatory Visit
Admission: RE | Admit: 2018-08-08 | Discharge: 2018-08-08 | Disposition: A | Discharge status: Home / Self Care | Payer: Medicare Other | Source: Ambulatory Visit | Attending: Family Medicine | Admitting: Family Medicine

## 2018-08-08 DIAGNOSIS — R928 Other abnormal and inconclusive findings on diagnostic imaging of breast: Secondary | ICD-10-CM

## 2018-08-08 DIAGNOSIS — R921 Mammographic calcification found on diagnostic imaging of breast: Secondary | ICD-10-CM | POA: Diagnosis not present

## 2018-08-11 ENCOUNTER — Other Ambulatory Visit: Payer: Self-pay | Admitting: Family Medicine

## 2018-08-11 DIAGNOSIS — M810 Age-related osteoporosis without current pathological fracture: Secondary | ICD-10-CM

## 2018-09-08 ENCOUNTER — Ambulatory Visit: Payer: Medicare Other | Admitting: Family Medicine

## 2018-10-06 ENCOUNTER — Ambulatory Visit: Payer: Medicare Other | Admitting: Family Medicine

## 2018-10-07 ENCOUNTER — Other Ambulatory Visit: Payer: Self-pay

## 2018-10-07 DIAGNOSIS — E559 Vitamin D deficiency, unspecified: Secondary | ICD-10-CM

## 2018-10-07 MED ORDER — VITAMIN D (ERGOCALCIFEROL) 1.25 MG (50000 UNIT) PO CAPS: 50000.0000 [IU] | ORAL_CAPSULE | ORAL | 2 refills | Status: DC

## 2019-01-16 ENCOUNTER — Other Ambulatory Visit: Payer: Self-pay

## 2019-03-23 ENCOUNTER — Other Ambulatory Visit: Payer: Self-pay | Admitting: Family Medicine

## 2019-03-23 DIAGNOSIS — E039 Hypothyroidism, unspecified: Secondary | ICD-10-CM

## 2019-05-05 ENCOUNTER — Other Ambulatory Visit: Payer: Self-pay | Admitting: Family Medicine

## 2019-05-05 DIAGNOSIS — M810 Age-related osteoporosis without current pathological fracture: Secondary | ICD-10-CM

## 2019-06-18 ENCOUNTER — Other Ambulatory Visit: Payer: Self-pay | Admitting: Family Medicine

## 2019-06-18 DIAGNOSIS — E039 Hypothyroidism, unspecified: Secondary | ICD-10-CM

## 2019-06-24 ENCOUNTER — Other Ambulatory Visit: Payer: Self-pay

## 2019-06-25 ENCOUNTER — Encounter: Payer: Self-pay | Admitting: Family Medicine

## 2019-06-25 ENCOUNTER — Ambulatory Visit (INDEPENDENT_AMBULATORY_CARE_PROVIDER_SITE_OTHER): Payer: Medicare Other | Admitting: Family Medicine

## 2019-06-25 VITALS — BP 132/78 | HR 72 | Temp 95.2°F | Wt 105.8 lb

## 2019-06-25 DIAGNOSIS — E7800 Pure hypercholesterolemia, unspecified: Secondary | ICD-10-CM | POA: Insufficient documentation

## 2019-06-25 DIAGNOSIS — M810 Age-related osteoporosis without current pathological fracture: Secondary | ICD-10-CM | POA: Diagnosis not present

## 2019-06-25 DIAGNOSIS — Z0000 Encounter for general adult medical examination without abnormal findings: Secondary | ICD-10-CM

## 2019-06-25 DIAGNOSIS — E559 Vitamin D deficiency, unspecified: Secondary | ICD-10-CM | POA: Diagnosis not present

## 2019-06-25 DIAGNOSIS — E039 Hypothyroidism, unspecified: Secondary | ICD-10-CM | POA: Diagnosis not present

## 2019-06-25 LAB — CBC
HCT: 41.3 % (ref 36.0–46.0)
Hemoglobin: 13.7 g/dL (ref 12.0–15.0)
MCHC: 33.2 g/dL (ref 30.0–36.0)
MCV: 89.8 fl (ref 78.0–100.0)
Platelets: 317 10*3/uL (ref 150.0–400.0)
RBC: 4.6 Mil/uL (ref 3.87–5.11)
RDW: 13.3 % (ref 11.5–15.5)
WBC: 7 10*3/uL (ref 4.0–10.5)

## 2019-06-25 LAB — COMPREHENSIVE METABOLIC PANEL
ALT: 10 U/L (ref 0–35)
AST: 13 U/L (ref 0–37)
Albumin: 4.1 g/dL (ref 3.5–5.2)
Alkaline Phosphatase: 55 U/L (ref 39–117)
BUN: 15 mg/dL (ref 6–23)
CO2: 26 mEq/L (ref 19–32)
Calcium: 9.8 mg/dL (ref 8.4–10.5)
Chloride: 106 mEq/L (ref 96–112)
Creatinine, Ser: 0.89 mg/dL (ref 0.40–1.20)
GFR: 62.05 mL/min (ref >60.00)
Glucose, Bld: 92 mg/dL (ref 70–99)
Potassium: 3.9 mEq/L (ref 3.5–5.1)
Sodium: 139 mEq/L (ref 135–145)
Total Bilirubin: 0.5 mg/dL (ref 0.2–1.2)
Total Protein: 6.6 g/dL (ref 6.0–8.3)

## 2019-06-25 LAB — URINALYSIS, ROUTINE W REFLEX MICROSCOPIC
Bilirubin Urine: NEGATIVE
Hgb urine dipstick: NEGATIVE
Ketones, ur: NEGATIVE
Leukocytes,Ua: NEGATIVE
Nitrite: NEGATIVE
RBC / HPF: NONE SEEN (ref 0–?)
Specific Gravity, Urine: 1.025 (ref 1.000–1.030)
Total Protein, Urine: NEGATIVE
Urine Glucose: NEGATIVE
Urobilinogen, UA: 0.2 (ref 0.0–1.0)
pH: 5 (ref 5.0–8.0)

## 2019-06-25 LAB — LIPID PANEL
Cholesterol: 201 mg/dL — ABNORMAL HIGH (ref 0–200)
HDL: 63.6 mg/dL (ref >39.00)
LDL Cholesterol: 120 mg/dL — ABNORMAL HIGH (ref 0–99)
NonHDL: 136.96
Total CHOL/HDL Ratio: 3
Triglycerides: 87 mg/dL (ref 0.0–149.0)
VLDL: 17.4 mg/dL (ref 0.0–40.0)

## 2019-06-25 LAB — TSH: TSH: 1.16 u[IU]/mL (ref 0.35–4.50)

## 2019-06-25 LAB — VITAMIN D 25 HYDROXY (VIT D DEFICIENCY, FRACTURES): VITD: 96.22 ng/mL (ref 30.00–100.00)

## 2019-06-25 NOTE — Patient Instructions (Signed)
Health Maintenance After Age 74 After age 74, you are at a higher risk for certain long-term diseases and infections as well as injuries from falls. Falls are a major cause of broken bones and head injuries in people who are older than age 74. Getting regular preventive care can help to keep you healthy and well. Preventive care includes getting regular testing and making lifestyle changes as recommended by your health care provider. Talk with your health care provider about:  Which screenings and tests you should have. A screening is a test that checks for a disease when you have no symptoms.  A diet and exercise plan that is right for you. What should I know about screenings and tests to prevent falls? Screening and testing are the best ways to find a health problem early. Early diagnosis and treatment give you the best chance of managing medical conditions that are common after age 74. Certain conditions and lifestyle choices may make you more likely to have a fall. Your health care provider may recommend:  Regular vision checks. Poor vision and conditions such as cataracts can make you more likely to have a fall. If you wear glasses, make sure to get your prescription updated if your vision changes.  Medicine review. Work with your health care provider to regularly review all of the medicines you are taking, including over-the-counter medicines. Ask your health care provider about any side effects that may make you more likely to have a fall. Tell your health care provider if any medicines that you take make you feel dizzy or sleepy.  Osteoporosis screening. Osteoporosis is a condition that causes the bones to get weaker. This can make the bones weak and cause them to break more easily.  Blood pressure screening. Blood pressure changes and medicines to control blood pressure can make you feel dizzy.  Strength and balance checks. Your health care provider may recommend certain tests to check your  strength and balance while standing, walking, or changing positions.  Foot health exam. Foot pain and numbness, as well as not wearing proper footwear, can make you more likely to have a fall.  Depression screening. You may be more likely to have a fall if you have a fear of falling, feel emotionally low, or feel unable to do activities that you used to do.  Alcohol use screening. Using too much alcohol can affect your balance and may make you more likely to have a fall. What actions can I take to lower my risk of falls? General instructions  Talk with your health care provider about your risks for falling. Tell your health care provider if: ? You fall. Be sure to tell your health care provider about all falls, even ones that seem minor. ? You feel dizzy, sleepy, or off-balance.  Take over-the-counter and prescription medicines only as told by your health care provider. These include any supplements.  Eat a healthy diet and maintain a healthy weight. A healthy diet includes low-fat dairy products, low-fat (lean) meats, and fiber from whole grains, beans, and lots of fruits and vegetables. Home safety  Remove any tripping hazards, such as rugs, cords, and clutter.  Install safety equipment such as grab bars in bathrooms and safety rails on stairs.  Keep rooms and walkways well-lit. Activity   Follow a regular exercise program to stay fit. This will help you maintain your balance. Ask your health care provider what types of exercise are appropriate for you.  If you need a cane or   walker, use it as recommended by your health care provider.  Wear supportive shoes that have nonskid soles. Lifestyle  Do not drink alcohol if your health care provider tells you not to drink.  If you drink alcohol, limit how much you have: ? 0-1 drink a day for women. ? 0-2 drinks a day for men.  Be aware of how much alcohol is in your drink. In the [place] one drink equals one typical bottle of beer (12  oz), one-half glass of wine (5 oz), or one shot of hard liquor (1 oz).  Do not use any products that contain nicotine or tobacco, such as cigarettes and e-cigarettes. If you need help quitting, ask your health care provider. Summary  Having a healthy lifestyle and getting preventive care can help to protect your health and wellness after age 74.  Screening and testing are the best way to find a health problem early and help you avoid having a fall. Early diagnosis and treatment give you the best chance for managing medical conditions that are more common for people who are older than age 74.  Falls are a major cause of broken bones and head injuries in people who are older than age 74. Take precautions to prevent a fall at home.  Work with your health care provider to learn what changes you can make to improve your health and wellness and to prevent falls. This information is not intended to replace advice given to you by your health care provider. Make sure you discuss any questions you have with your health care provider. Document Revised: 09/25/2018 Document Reviewed: 04/17/2017 Elsevier Patient Education  2020 [employer]  

## 2019-06-25 NOTE — Progress Notes (Signed)
Established Patient Office Visit  Subjective:  Patient ID: Martha Swanson, female    DOB: 1945/11/14  Age: 74 y.o. MRN: [id number]  CC:  Chief Complaint  Patient presents with  . Follow-up    f/u on labs, pt would like to know if she should still be on vitamin D,    HPI Martha Swanson presents for follow-up of her elevated cholesterol, hypertension thyroidism and osteoporosis.  Previously taken atorvastatin has been discontinued secondary to favorable lipid profile.  We will check her profile today.  Continues to take low-dose levothyroxine without issue.  She continues on Fosamax and high-dose vitamin D.  Hopefully would like to discontinue the vitamin D at the higher dose.  Continues to follow when she can.  Stays busy by reading working around the house.  Has not seen the dentist this year.  Past Medical History:  Diagnosis Date  . Heart disease   . Takotsubo cardiomyopathy     cardiac catheterization initial ejection fraction 20% no coronary artery disease ejection fraction improved after 5 days   . Unspecified hypothyroidism     Past Surgical History:  Procedure Laterality Date  . ABDOMINAL HYSTERECTOMY    . COLONOSCOPY    . COLONOSCOPY N/A 04/01/2013   Procedure: COLONOSCOPY;  Surgeon: Rogene Houston, MD;  Location: [facility];  Service: Endoscopy;  Laterality: N/A;  830  . THYROIDECTOMY  1997  . TONSILLECTOMY      Family History  Problem Relation Age of Onset  . Heart attack Father 41  . Early death Father   . Arthritis Mother   . COPD Mother   . Hypertension Mother   . Arthritis Brother   . Asthma Brother   . Hypertension Brother   . Cancer Maternal Grandfather   . Arthritis Brother   . Colon cancer Neg Hx     Social History   Socioeconomic History  . Marital status: Married    Spouse name: Not on file  . Number of children: Not on file  . Years of education: Not on file  . Highest education level: Not on file  Occupational History   Comment: Part-time pastor  Tobacco Use  . Smoking status: Never Smoker  . Smokeless tobacco: Never Used  Substance and Sexual Activity  . Alcohol use: Yes    Comment: occasional wine  . Drug use: No  . Sexual activity: Not on file  Other Topics Concern  . Not on file  Social History Narrative  . Not on file   Social Determinants of Health   Financial Resource Strain:   . Difficulty of Paying Living Expenses: Not on file  Food Insecurity:   . Worried About Charity fundraiser in the Last Year: Not on file  . Ran Out of Food in the Last Year: Not on file  Transportation Needs:   . Lack of Transportation (Medical): Not on file  . Lack of Transportation (Non-Medical): Not on file  Physical Activity:   . Days of Exercise per Week: Not on file  . Minutes of Exercise per Session: Not on file  Stress:   . Feeling of Stress : Not on file  Social Connections:   . Frequency of Communication with Friends and Family: Not on file  . Frequency of Social Gatherings with Friends and Family: Not on file  . Attends Religious Services: Not on file  . Active Member of Clubs or Organizations: Not on file  . Attends Archivist  Meetings: Not on file  . Marital Status: Not on file  Intimate Partner Violence:   . Fear of Current or Ex-Partner: Not on file  . Emotionally Abused: Not on file  . Physically Abused: Not on file  . Sexually Abused: Not on file    Outpatient Medications Prior to Visit  Medication Sig Dispense Refill  . alendronate (FOSAMAX) 70 MG tablet Take 1 tablet by mouth once a week 12 tablet 1  . calcium carbonate (OS-CAL) 600 MG TABS tablet Take 1 tablet (600 mg total) by mouth 2 (two) times daily with a meal. 60 tablet 12  . levothyroxine (SYNTHROID) 50 MCG tablet TAKE 1 TABLET BY MOUTH ONCE DAILY BEFORE BREAKFAST -  NEED  FOLLOW  UP  AND  LABS 30 tablet 0  . Vitamin D, Ergocalciferol, (DRISDOL) 1.25 MG (50000 UT) CAPS capsule Take 1 capsule (50,000 Units total) by  mouth every 7 (seven) days. 15 capsule 2   No facility-administered medications prior to visit.    Allergies  Allergen Reactions  . Penicillins Anaphylaxis    ROS Review of Systems  Constitutional: Negative for diaphoresis, fatigue, fever and unexpected weight change.  HENT: Negative.   Eyes: Negative for photophobia and visual disturbance.  Respiratory: Negative.   Cardiovascular: Negative.   Gastrointestinal: Negative.   Endocrine: Negative for cold intolerance and heat intolerance.  Genitourinary: Negative.   Musculoskeletal: Negative for gait problem and joint swelling.  Skin: Negative for pallor and rash.  Allergic/Immunologic: Negative for immunocompromised state.  Neurological: Negative for syncope and speech difficulty.  Hematological: Does not bruise/bleed easily.  Psychiatric/Behavioral: Negative.       Objective:    Physical Exam  Constitutional: She is oriented to person, place, and time. She appears well-developed and well-nourished. No distress.  HENT:  Head: Normocephalic and atraumatic.  Right Ear: External ear normal.  Left Ear: External ear normal.  Eyes: Conjunctivae are normal. Right eye exhibits no discharge. Left eye exhibits no discharge. No scleral icterus.  Neck: No JVD present. No tracheal deviation present.  Cardiovascular: Normal rate, regular rhythm and normal heart sounds.  Pulmonary/Chest: Effort normal and breath sounds normal. No stridor.  Abdominal: Bowel sounds are normal.  Musculoskeletal:        General: No edema.  Neurological: She is alert and oriented to person, place, and time.  Skin: Skin is warm and dry. She is not diaphoretic.  Psychiatric: She has a normal mood and affect. Her behavior is normal.    BP 132/78   Pulse 72   Temp (!) 95.2 F (35.1 C) (Tympanic)   Wt 105 lb 12.8 oz (48 kg)   SpO2 100%   BMI 22.11 kg/m  Wt Readings from Last 3 Encounters:  06/25/19 105 lb 12.8 oz (48 kg)  03/18/18 107 lb (48.5 kg)    03/07/18 106 lb 6 oz (48.3 kg)     Health Maintenance Due  Topic Date Due  . Hepatitis C Screening  12/17/1945  . PNA vac Low Risk Adult (2 of 2 - PCV13) 06/09/2014    There are no preventive care reminders to display for this patient.  Lab Results  Component Value Date   TSH 1.18 03/07/2018   Lab Results  Component Value Date   WBC 3.5 06/30/2015   HGB 14.1 06/30/2015   HCT 42 06/30/2015   MCV 86.4 02/15/2011   PLT 230 06/30/2015   Lab Results  Component Value Date   NA 140 12/05/2017   K 4.3 12/05/2017  CO2 28 12/05/2017   GLUCOSE 92 12/05/2017   BUN 10 12/05/2017   CREATININE 0.87 12/05/2017   BILITOT 0.6 12/05/2017   ALKPHOS 61 12/05/2017   AST 13 12/05/2017   ALT 11 12/05/2017   PROT 6.6 12/05/2017   ALBUMIN 4.1 12/05/2017   CALCIUM 9.5 12/05/2017   GFR 68.00 12/05/2017   Lab Results  Component Value Date   CHOL 177 06/30/2015   Lab Results  Component Value Date   HDL 62 06/30/2015   Lab Results  Component Value Date   LDLCALC 91 06/30/2015   Lab Results  Component Value Date   TRIG 121 06/30/2015   Lab Results  Component Value Date   CHOLHDL 3.8 02/13/2011   No results found for: HGBA1C    Assessment & Plan:   Problem List Items Addressed This Visit      Endocrine   Acquired hypothyroidism   Relevant Orders   CBC   TSH     Musculoskeletal and Integument   Age-related osteoporosis without current pathological fracture     Other   Vitamin D deficiency - Primary   Relevant Orders   VITAMIN D 25 Hydroxy (Vit-D Deficiency, Fractures)   Elevated cholesterol   Relevant Orders   Comp Met (CMET)   Lipid Profile   Healthcare maintenance   Relevant Orders   Urinalysis, Routine w reflex microscopic      No orders of the defined types were placed in this encounter.   Follow-up: Return in about 6 months (around 12/23/2019).   Patient was given information on health maintenance.  Hopefully will be able to discontinue the  high-dose vitamin D.  Advised her to continue with dental care. Libby Maw, MD

## 2019-06-30 ENCOUNTER — Ambulatory Visit: Payer: Medicare Other | Attending: Internal Medicine

## 2019-06-30 DIAGNOSIS — Z23 Encounter for immunization: Secondary | ICD-10-CM | POA: Diagnosis not present

## 2019-06-30 NOTE — Progress Notes (Signed)
   Covid-19 Vaccination Clinic  Name:  Martha Swanson    MRN: [id number] DOB: [birth year]  06/30/2019  [name] was observed post Covid-19 immunization for 30 minutes based on pre-vaccination screening without incidence. She was provided with Vaccine Information Sheet and instruction to access the V-Safe system.   [name] was instructed to call 911 with any severe reactions post vaccine: [name] Difficulty breathing  . Swelling of your face and throat  . A fast heartbeat  . A bad rash all over your body  . Dizziness and weakness    Immunizations Administered    Name Date Dose VIS Date Route   Pfizer COVID-19 Vaccine 06/30/2019 11:04 AM 0.3 mL 05/29/2019 Intramuscular   Manufacturer: [place]   Lot: [id number]   [place]: [id number]

## 2019-07-16 ENCOUNTER — Other Ambulatory Visit: Payer: Self-pay | Admitting: Family Medicine

## 2019-07-16 ENCOUNTER — Encounter: Payer: Self-pay | Admitting: Family Medicine

## 2019-07-16 DIAGNOSIS — E039 Hypothyroidism, unspecified: Secondary | ICD-10-CM

## 2019-07-20 ENCOUNTER — Ambulatory Visit: Payer: Medicare Other | Attending: Internal Medicine

## 2019-07-20 DIAGNOSIS — Z23 Encounter for immunization: Secondary | ICD-10-CM | POA: Insufficient documentation

## 2019-07-20 NOTE — Progress Notes (Signed)
   Covid-19 Vaccination Clinic  Name:  Martha Swanson    MRN: [id number] DOB: 1946/05/07  07/20/2019  [name] was observed post Covid-19 immunization for 30 minutes based on pre-vaccination screening without incidence. She was provided with Vaccine Information Sheet and instruction to access the V-Safe system.   [name] was instructed to call 911 with any severe reactions post vaccine: [name] Difficulty breathing  . Swelling of your face and throat  . A fast heartbeat  . A bad rash all over your body  . Dizziness and weakness    Immunizations Administered    Name Date Dose VIS Date Route   Pfizer COVID-19 Vaccine 07/20/2019  8:33 AM 0.3 mL 05/29/2019 Intramuscular   Manufacturer: [place]   Lot: [id number]   [place]: [id number]

## 2019-07-29 ENCOUNTER — Other Ambulatory Visit: Payer: Self-pay | Admitting: Family Medicine

## 2019-07-29 DIAGNOSIS — Z1231 Encounter for screening mammogram for malignant neoplasm of breast: Secondary | ICD-10-CM

## 2019-08-19 ENCOUNTER — Ambulatory Visit
Admission: RE | Admit: 2019-08-19 | Discharge: 2019-08-19 | Disposition: A | Discharge status: Home / Self Care | Payer: Medicare Other | Source: Ambulatory Visit | Attending: Family Medicine | Admitting: Family Medicine

## 2019-08-19 ENCOUNTER — Other Ambulatory Visit: Payer: Self-pay

## 2019-08-19 DIAGNOSIS — Z1231 Encounter for screening mammogram for malignant neoplasm of breast: Secondary | ICD-10-CM

## 2019-11-04 ENCOUNTER — Other Ambulatory Visit: Payer: Self-pay | Admitting: Family Medicine

## 2019-11-04 DIAGNOSIS — M810 Age-related osteoporosis without current pathological fracture: Secondary | ICD-10-CM

## 2019-12-23 ENCOUNTER — Other Ambulatory Visit: Payer: Self-pay

## 2019-12-24 ENCOUNTER — Ambulatory Visit (INDEPENDENT_AMBULATORY_CARE_PROVIDER_SITE_OTHER): Payer: Medicare Other | Admitting: Family Medicine

## 2019-12-24 ENCOUNTER — Encounter: Payer: Self-pay | Admitting: Family Medicine

## 2019-12-24 VITALS — BP 104/70 | HR 69 | Temp 97.2°F | Ht <= 58 in | Wt 105.2 lb

## 2019-12-24 DIAGNOSIS — E559 Vitamin D deficiency, unspecified: Secondary | ICD-10-CM

## 2019-12-24 DIAGNOSIS — E039 Hypothyroidism, unspecified: Secondary | ICD-10-CM | POA: Diagnosis not present

## 2019-12-24 DIAGNOSIS — M810 Age-related osteoporosis without current pathological fracture: Secondary | ICD-10-CM | POA: Diagnosis not present

## 2019-12-24 DIAGNOSIS — E7800 Pure hypercholesterolemia, unspecified: Secondary | ICD-10-CM | POA: Diagnosis not present

## 2019-12-24 LAB — LDL CHOLESTEROL, DIRECT: Direct LDL: 95 mg/dL

## 2019-12-24 LAB — VITAMIN D 25 HYDROXY (VIT D DEFICIENCY, FRACTURES): VITD: 40.44 ng/mL (ref 30.00–100.00)

## 2019-12-24 LAB — TSH: TSH: 1 u[IU]/mL (ref 0.35–4.50)

## 2019-12-24 NOTE — Patient Instructions (Signed)
Eating Plan for Osteoporosis Osteoporosis causes your bones to become weak and brittle. This puts you at greater risk for bone breaks (fractures) from small bumps or falls. Making changes to your diet and increasing your physical activity can help strengthen your bones and improve your overall health. Calcium and vitamin D are nutrients that play an important role in bone health. Vitamin D helps your body use calcium and strengthen bones. Therefore, it is important to get enough calcium and vitamin D as part of your eating plan for osteoporosis. What are tips for following this plan? Reading food labels  Try to get at least 1,000 milligrams (mg) of calcium each day.  Look for foods that have at least 50 mg of calcium per serving.  Talk with your health care provider about taking a calcium supplement if you do not get enough calcium from food.  Do not have more than 2,500 mg of calcium each day. This is the upper limit for food and nutritional supplements combined. Too much calcium may cause constipation and prevent you from absorbing other important nutrients.  Choose foods that contain vitamin D.  Take a daily vitamin supplement that contains 800-1,000 international units (IU) of vitamin D. The amount may be different depending on your age, body weight, ethnicity, and where you live. Talk with your dietitian or health care provider about how much vitamin D is right for you.  Avoid foods that have more than 300 mg of sodium per serving. Too much sodium can cause your body to lose calcium.  Talk with your dietitian or health care provider about how much sodium you are allowed each day. Shopping  Do not buy foods with added salt, including: ? Salted snacks. ? Martha Swanson. ? Canned soups. ? Canned meats. ? Processed meats, such as bacon or cold cuts. ? Smoked fish. Meal planning  Eat balanced meals that contain protein foods, fruits and vegetables, and foods rich in calcium and vitamin  D.  Eat at least 5 servings of fruits and vegetables each day.  Eat 5-6 oz. of lean meat, poultry, fish, eggs, or beans each day. Lifestyle  Do not use any products that contain nicotine or tobacco, such as cigarettes and e-cigarettes. If you need help quitting, ask your health care provider.  If your health care provider recommends that you lose weight: ? Work with a dietitian to develop an eating plan that will help you reach your desired weight goal. ? Exercise for at least 30 minutes a day, 5 or more days a week, or as told by your health care provider.  Work with a physical therapist to develop an exercise plan that includes flexibility, balance, and strength exercises.  If you drink alcohol, limit how much you have. This means: ? 0-1 drink a day for women. ? 0-2 drinks a day for men. ? Be aware of how much alcohol is in your drink. In the [place] one drink equals one typical bottle of beer (12 oz), one-half glass of wine (5 oz), or one shot of hard liquor (1 oz). What foods should I eat? Foods high in calcium   Yogurt. Yogurt with fruit.  Milk. Evaporated skim milk. Dry milk powder.  Calcium-fortified orange juice.  Parmesan cheese. Part-skim ricotta cheese. Natural hard cheese. Cream cheese. Cottage cheese.  Canned sardines. Canned salmon.  Calcium-treated tofu. Calcium-fortified cereal bar. Calcium-fortified cereal. Calcium-fortified graham crackers.  Cooked collard greens. Turnip greens. Broccoli. Kale.  Almonds.  White beans.  Corn tortilla. Foods high in  vitamin D  Cod liver oil. Fatty fish, such as tuna, mackerel, and salmon.  Milk. Fortified soy milk. Fortified fruit juice.  Yogurt. Margarine.  Egg yolks. Foods high in protein  Beef. Lamb. Pork tenderloin.  Chicken breast.  Tuna (canned). Fish fillet.  Tofu.  Soy beans (cooked). Soy patty. Beans (canned or cooked).  Cottage cheese.  Yogurt.  Peanut butter.  Pumpkin seeds. Nuts.  Sunflower seeds.  Hard cheese.  Milk or other milk products, such as soy milk. The items listed above may not be a complete list of foods and beverages you can eat. Contact a dietitian for more options. Summary  Calcium and vitamin D are nutrients that play an important role in bone health and are an important part of your eating plan for osteoporosis.  Eat balanced meals that contain protein foods, fruits and vegetables, and foods rich in calcium and vitamin D.  Avoid foods that have more than 300 mg of sodium per serving. Too much sodium can cause your body to lose calcium.  Exercise is an important part of prevention and treatment of osteoporosis. Aim for at least 30 minutes a day, 5 days a week. This information is not intended to replace advice given to you by your health care provider. Make sure you discuss any questions you have with your health care provider. Document Revised: 08/12/2017 Document Reviewed: 08/12/2017 Elsevier Patient Education  2020 [place].  Preventing High Cholesterol Cholesterol is a white, waxy substance similar to fat that the human body needs to help build cells. The liver makes all the cholesterol that a person's body needs. Having high cholesterol (hypercholesterolemia) increases a person's risk for heart disease and stroke. Extra (excess) cholesterol comes from the food the person eats. High cholesterol can often be prevented with diet and lifestyle changes. If you already have high cholesterol, you can control it with diet and lifestyle changes and with medicine. How can high cholesterol affect me? If you have high cholesterol, deposits (plaques) may build up on the walls of your arteries. The arteries are the blood vessels that carry blood away from your heart. Plaques make the arteries narrower and stiffer. This can limit or block blood flow and cause blood clots to form. Blood clots:  Are tiny balls of cells that form in your blood.  Can move to  the heart or brain, causing a heart attack or stroke. Plaques in arteries greatly increase your risk for heart attack and stroke.Making diet and lifestyle changes can reduce your risk for these conditions that may threaten your life. What can increase my risk? This condition is more likely to develop in people who:  Eat foods that are high in saturated fat or cholesterol. Saturated fat is mostly found in: ? Foods that contain animal fat, such as red meat and some dairy products. ? Certain fatty foods made from plants, such as tropical oils.  Are overweight.  Are not getting enough exercise.  Have a family history of high cholesterol. What actions can I take to prevent this? Nutrition   Eat less saturated fat.  Avoid trans fats (partially hydrogenated oils). These are often found in margarine and in some baked goods, fried foods, and snacks bought in packages.  Avoid precooked or cured meat, such as sausages or meat loaves.  Avoid foods and drinks that have added sugars.  Eat more fruits, vegetables, and whole grains.  Choose healthy sources of protein, such as fish, poultry, lean cuts of red meat, beans, peas, lentils,  and nuts.  Choose healthy sources of fat, such as: ? Nuts. ? Vegetable oils, especially olive oil. ? Fish that have healthy fats (omega-3 fatty acids), such as mackerel or salmon. The items listed above may not be a complete list of recommended foods and beverages. Contact a dietitian for more information. Lifestyle  Lose weight if you are overweight. Losing 5-10 lb (2.3-4.5 kg) can help prevent or control high cholesterol. It can also lower your risk for diabetes and high blood pressure. Ask your health care provider to help you with a diet and exercise plan to lose weight safely.  Do not use any products that contain nicotine or tobacco, such as cigarettes, e-cigarettes, and chewing tobacco. If you need help quitting, ask your health care provider.  Limit  your alcohol intake. ? Do not drink alcohol if:  Your health care provider tells you not to drink.  You are pregnant, may be pregnant, or are planning to become pregnant. ? If you drink alcohol:  Limit how much you use to:  0-1 drink a day for women.  0-2 drinks a day for men.  Be aware of how much alcohol is in your drink. In the [place] one drink equals one 12 oz bottle of beer (355 mL), one 5 oz glass of wine (148 mL), or one 1 oz glass of hard liquor (44 mL). Activity   Get enough exercise. Each week, do at least 150 minutes of exercise that takes a medium level of effort (moderate-intensity exercise). ? This is exercise that:  Makes your heart beat faster and makes you breathe harder than usual.  Allows you to still be able to talk. ? You could exercise in short sessions several times a day or longer sessions a few times a week. For example, on 5 days each week, you could walk fast or ride your bike 3 times a day for 10 minutes each time.  Do exercises as told by your health care provider. Medicines  In addition to diet and lifestyle changes, your health care provider may recommend medicines to help lower cholesterol. This may be a medicine to lower the amount of cholesterol your liver makes. You may need medicine if: ? Diet and lifestyle changes do not lower your cholesterol enough. ? You have high cholesterol and other risk factors for heart disease or stroke.  Take over-the-counter and prescription medicines only as told by your health care provider. General information  Manage your risk factors for high cholesterol. Talk with your health care provider about all your risk factors and how to lower your risk.  Manage other conditions that you have, such as diabetes or high blood pressure (hypertension).  Have blood tests to check your cholesterol levels at regular points in time as told by your health care provider.  Keep all follow-up visits as told by your health care  provider. This is important. Where to find more information  American Heart Association: www.heart.org  National Heart, Lung, and Blood Institute: https://wilson-e[place].com/ Summary  High cholesterol increases your risk for heart disease and stroke. By keeping your cholesterol level low, you can reduce your risk for these conditions.  High cholesterol can often be prevented with diet and lifestyle changes.  Work with your health care provider to manage your risk factors, and have your blood tested regularly. This information is not intended to replace advice given to you by your health care provider. Make sure you discuss any questions you have with your health care provider. Document  Revised: 09/26/2018 Document Reviewed: 02/11/2016 Elsevier Patient Education  [place].

## 2019-12-24 NOTE — Progress Notes (Signed)
Established Patient Office Visit  Subjective:  Patient ID: Martha Swanson, female    DOB: 1945/11/15  Age: 74 y.o. MRN: [id number]  CC:  Chief Complaint  Patient presents with  . Follow-up    6 month follow up, no concerns.     HPI Martha Swanson presents for follow-up of hypothyroidism, osteoporosis, vitamin D deficiency, elevated LDL cholesterol.  Taking levothyroxine each morning on a fasting stomach.  Continues with calcium and vitamin D with Fosamax for her osteoporosis.  She is exercising.  Elected not to start the atorvastatin.  Mildly elevated risk for ASCVD.  Favorable HDL profile and a non-smoker without hypertension.  Past Medical History:  Diagnosis Date  . Heart disease   . Takotsubo cardiomyopathy     cardiac catheterization initial ejection fraction 20% no coronary artery disease ejection fraction improved after 5 days   . Unspecified hypothyroidism     Past Surgical History:  Procedure Laterality Date  . ABDOMINAL HYSTERECTOMY    . COLONOSCOPY    . COLONOSCOPY N/A 04/01/2013   Procedure: COLONOSCOPY;  Surgeon: Rogene Houston, MD;  Location: [facility];  Service: Endoscopy;  Laterality: N/A;  830  . THYROIDECTOMY  1997  . TONSILLECTOMY      Family History  Problem Relation Age of Onset  . Heart attack Father 30  . Early death Father   . Arthritis Mother   . COPD Mother   . Hypertension Mother   . Arthritis Brother   . Asthma Brother   . Hypertension Brother   . Cancer Maternal Grandfather   . Arthritis Brother   . Colon cancer Neg Hx     Social History   Socioeconomic History  . Marital status: Married    Spouse name: Not on file  . Number of children: Not on file  . Years of education: Not on file  . Highest education level: Not on file  Occupational History    Comment: Part-time pastor  Tobacco Use  . Smoking status: Never Smoker  . Smokeless tobacco: Never Used  Vaping Use  . Vaping Use: Never used  Substance and Sexual  Activity  . Alcohol use: Yes    Comment: occasional wine  . Drug use: No  . Sexual activity: Not on file  Other Topics Concern  . Not on file  Social History Narrative  . Not on file   Social Determinants of Health   Financial Resource Strain:   . Difficulty of Paying Living Expenses:   Food Insecurity:   . Worried About Charity fundraiser in the Last Year:   . Arboriculturist in the Last Year:   Transportation Needs:   . Film/video editor (Medical):   [name] Lack of Transportation (Non-Medical):   Physical Activity:   . Days of Exercise per Week:   . Minutes of Exercise per Session:   Stress:   . Feeling of Stress :   Social Connections:   . Frequency of Communication with Friends and Family:   . Frequency of Social Gatherings with Friends and Family:   . Attends Religious Services:   . Active Member of Clubs or Organizations:   . Attends Archivist Meetings:   [name] Marital Status:   Intimate Partner Violence:   . Fear of Current or Ex-Partner:   . Emotionally Abused:   [name] Physically Abused:   . Sexually Abused:     Outpatient Medications Prior to Visit  Medication Sig Dispense  Refill  . alendronate (FOSAMAX) 70 MG tablet Take 1 tablet by mouth once a week 12 tablet 0  . calcium carbonate (OS-CAL) 600 MG TABS tablet Take 1 tablet (600 mg total) by mouth 2 (two) times daily with a meal. 60 tablet 12  . levothyroxine (SYNTHROID) 50 MCG tablet TAKE 1 TABLET BY MOUTH ONCE DAILY BEFORE BREAKFAST **NEED  FOLLOW  UP  AND  LABS  FOR  FURTHER  REFILLS** 90 tablet 3  . Vitamin D, Ergocalciferol, (DRISDOL) 1.25 MG (50000 UT) CAPS capsule Take 1 capsule (50,000 Units total) by mouth every 7 (seven) days. (Patient not taking: Reported on 12/24/2019) 15 capsule 2   No facility-administered medications prior to visit.    Allergies  Allergen Reactions  . Penicillins Anaphylaxis    ROS Review of Systems  Constitutional: Negative.   HENT: Negative.   Eyes: Negative for  photophobia and visual disturbance.  Respiratory: Negative.   Cardiovascular: Negative.   Gastrointestinal: Negative.   Endocrine: Negative for polyphagia and polyuria.  Genitourinary: Negative.   Musculoskeletal: Negative for gait problem and joint swelling.  Skin: Negative for pallor and rash.  Allergic/Immunologic: Negative for immunocompromised state.  Neurological: Negative for tremors, speech difficulty and light-headedness.  Hematological: Does not bruise/bleed easily.  Psychiatric/Behavioral: Negative.       Objective:    Physical Exam Vitals and nursing note reviewed.  Constitutional:      General: She is not in acute distress.    Appearance: Normal appearance. She is normal weight. She is not ill-appearing, toxic-appearing or diaphoretic.  HENT:     Head: Normocephalic and atraumatic.     Right Ear: There is no impacted cerumen.     Left Ear: There is no impacted cerumen.     Nose: No congestion or rhinorrhea.  Eyes:     General: No scleral icterus.       Right eye: No discharge.        Left eye: No discharge.     Conjunctiva/sclera: Conjunctivae normal.     Pupils: Pupils are equal, round, and reactive to light.  Cardiovascular:     Rate and Rhythm: Normal rate and regular rhythm.  Pulmonary:     Effort: Pulmonary effort is normal.     Breath sounds: Normal breath sounds.  Abdominal:     General: Bowel sounds are normal.  Musculoskeletal:     Cervical back: No rigidity or tenderness.     Right lower leg: No edema.     Left lower leg: No edema.  Lymphadenopathy:     Cervical: No cervical adenopathy.  Skin:    General: Skin is warm and dry.  Neurological:     Mental Status: She is alert and oriented to person, place, and time.  Psychiatric:        Mood and Affect: Mood normal.        Behavior: Behavior normal.     BP 104/70   Pulse 69   Temp (!) 97.2 F (36.2 C) (Tympanic)   Ht 4\' 10"  (1.473 m)   Wt 105 lb 3.2 oz (47.7 kg)   SpO2 99%   BMI 21.99  kg/m  Wt Readings from Last 3 Encounters:  12/24/19 105 lb 3.2 oz (47.7 kg)  06/25/19 105 lb 12.8 oz (48 kg)  03/18/18 107 lb (48.5 kg)   The 10-year ASCVD risk score Mikey Bussing DC Jr., et al., 2013) is: 9.9%   Values used to calculate the score:     Age: 74  years     Sex: Female     Is Non-Hispanic African American: No     Diabetic: No     Tobacco smoker: No     Systolic Blood Pressure: 413 mmHg     Is BP treated: No     HDL Cholesterol: 63.6 mg/dL     Total Cholesterol: 201 mg/dL  Health Maintenance Due  Topic Date Due  . Hepatitis C Screening  Never done  . PNA vac Low Risk Adult (2 of 2 - PCV13) 06/09/2014    There are no preventive care reminders to display for this patient.  Lab Results  Component Value Date   TSH 1.16 06/25/2019   Lab Results  Component Value Date   WBC 7.0 06/25/2019   HGB 13.7 06/25/2019   HCT 41.3 06/25/2019   MCV 89.8 06/25/2019   PLT 317.0 06/25/2019   Lab Results  Component Value Date   NA 139 06/25/2019   K 3.9 06/25/2019   CO2 26 06/25/2019   GLUCOSE 92 06/25/2019   BUN 15 06/25/2019   CREATININE 0.89 06/25/2019   BILITOT 0.5 06/25/2019   ALKPHOS 55 06/25/2019   AST 13 06/25/2019   ALT 10 06/25/2019   PROT 6.6 06/25/2019   ALBUMIN 4.1 06/25/2019   CALCIUM 9.8 06/25/2019   GFR 62.05 06/25/2019   Lab Results  Component Value Date   CHOL 201 (H) 06/25/2019   Lab Results  Component Value Date   HDL 63.60 06/25/2019   Lab Results  Component Value Date   LDLCALC 120 (H) 06/25/2019   Lab Results  Component Value Date   TRIG 87.0 06/25/2019   Lab Results  Component Value Date   CHOLHDL 3 06/25/2019   No results found for: HGBA1C    Assessment & Plan:   Problem List Items Addressed This Visit      Endocrine   Acquired hypothyroidism - Primary   Relevant Orders   TSH     Musculoskeletal and Integument   Age-related osteoporosis without current pathological fracture     Other   Vitamin D deficiency   Relevant  Orders   VITAMIN D 25 Hydroxy (Vit-D Deficiency, Fractures)   Elevated LDL cholesterol level   Relevant Orders   LDL cholesterol, direct      No orders of the defined types were placed in this encounter.   Follow-up: Return in about 1 year (around 12/23/2020), or if symptoms worsen or fail to improve.  Continue above medicines.  Given information on managing cholesterol and osteoporosis.  Libby Maw, MD

## 2020-02-17 ENCOUNTER — Other Ambulatory Visit: Payer: Self-pay | Admitting: Family Medicine

## 2020-02-17 DIAGNOSIS — M810 Age-related osteoporosis without current pathological fracture: Secondary | ICD-10-CM

## 2020-03-16 DIAGNOSIS — Z23 Encounter for immunization: Secondary | ICD-10-CM | POA: Diagnosis not present

## 2020-05-27 ENCOUNTER — Other Ambulatory Visit: Payer: Self-pay | Admitting: Family Medicine

## 2020-05-27 DIAGNOSIS — M810 Age-related osteoporosis without current pathological fracture: Secondary | ICD-10-CM

## 2020-05-27 NOTE — Telephone Encounter (Signed)
Last OV 12/24/19 Last fill 02/17/20  #12/0

## 2020-07-12 ENCOUNTER — Other Ambulatory Visit: Payer: Self-pay

## 2020-07-12 DIAGNOSIS — E039 Hypothyroidism, unspecified: Secondary | ICD-10-CM

## 2020-07-12 MED ORDER — LEVOTHYROXINE SODIUM 50 MCG PO TABS: ORAL_TABLET | ORAL | 0 refills | Status: DC

## 2020-08-31 ENCOUNTER — Other Ambulatory Visit: Payer: Self-pay | Admitting: Family Medicine

## 2020-08-31 DIAGNOSIS — Z1231 Encounter for screening mammogram for malignant neoplasm of breast: Secondary | ICD-10-CM

## 2020-09-17 DIAGNOSIS — Z23 Encounter for immunization: Secondary | ICD-10-CM | POA: Diagnosis not present

## 2020-10-10 ENCOUNTER — Other Ambulatory Visit: Payer: Self-pay | Admitting: Family Medicine

## 2020-10-10 DIAGNOSIS — E039 Hypothyroidism, unspecified: Secondary | ICD-10-CM

## 2020-10-10 DIAGNOSIS — M810 Age-related osteoporosis without current pathological fracture: Secondary | ICD-10-CM

## 2020-10-21 DIAGNOSIS — H2513 Age-related nuclear cataract, bilateral: Secondary | ICD-10-CM | POA: Diagnosis not present

## 2020-10-24 ENCOUNTER — Ambulatory Visit
Admission: RE | Admit: 2020-10-24 | Discharge: 2020-10-24 | Disposition: A | Discharge status: Home / Self Care | Payer: Medicare Other | Source: Ambulatory Visit | Attending: Family Medicine | Admitting: Family Medicine

## 2020-10-24 ENCOUNTER — Other Ambulatory Visit: Payer: Self-pay

## 2020-10-24 DIAGNOSIS — Z1231 Encounter for screening mammogram for malignant neoplasm of breast: Secondary | ICD-10-CM | POA: Diagnosis not present

## 2020-12-13 ENCOUNTER — Ambulatory Visit (INDEPENDENT_AMBULATORY_CARE_PROVIDER_SITE_OTHER): Payer: Medicare Other | Admitting: *Deleted

## 2020-12-13 DIAGNOSIS — M810 Age-related osteoporosis without current pathological fracture: Secondary | ICD-10-CM

## 2020-12-13 DIAGNOSIS — Z13820 Encounter for screening for osteoporosis: Secondary | ICD-10-CM | POA: Diagnosis not present

## 2020-12-13 DIAGNOSIS — Z0000 Encounter for general adult medical examination without abnormal findings: Secondary | ICD-10-CM | POA: Diagnosis not present

## 2020-12-13 NOTE — Patient Instructions (Signed)
[name] , Thank you for taking time to come for your Medicare Wellness Visit. I appreciate your ongoing commitment to your health goals. Please review the following plan we discussed and let me know if I can assist you in the future.   Screening recommendations/referrals: Colonoscopy: no longer required Mammogram: up to date Bone Density: Education provided Recommended yearly ophthalmology/optometry visit for glaucoma screening and checkup Recommended yearly dental visit for hygiene and checkup  Vaccinations: Influenza vaccine: up to date Pneumococcal vaccine: education provided Tdap vaccine: up to date Shingles vaccine: up to date    Advanced directives: copy requested  Conditions/risks identified: na  Next appointment: 12-27-2020 @ 8:00 am Dr. Ethelene Hal   Preventive Care 65 Years and Older, Female Preventive care refers to lifestyle choices and visits with your health care provider that can promote health and wellness. What does preventive care include? A yearly physical exam. This is also called an annual well check. Dental exams once or twice a year. Routine eye exams. Ask your health care provider how often you should have your eyes checked. Personal lifestyle choices, including: Daily care of your teeth and gums. Regular physical activity. Eating a healthy diet. Avoiding tobacco and drug use. Limiting alcohol use. Practicing safe sex. Taking low-dose aspirin every day. Taking vitamin and mineral supplements as recommended by your health care provider. What happens during an annual well check? The services and screenings done by your health care provider during your annual well check will depend on your age, overall health, lifestyle risk factors, and family history of disease. Counseling  Your health care provider may ask you questions about your: Alcohol use. Tobacco use. Drug use. Emotional well-being. Home and relationship well-being. Sexual activity. Eating  habits. History of falls. Memory and ability to understand (cognition). Work and work Statistician. Reproductive health. Screening  You may have the following tests or measurements: Height, weight, and BMI. Blood pressure. Lipid and cholesterol levels. These may be checked every 5 years, or more frequently if you are over 24 years old. Skin check. Lung cancer screening. You may have this screening every year starting at age 54 if you have a 30-pack-year history of smoking and currently smoke or have quit within the past 15 years. Fecal occult blood test (FOBT) of the stool. You may have this test every year starting at age 28. Flexible sigmoidoscopy or colonoscopy. You may have a sigmoidoscopy every 5 years or a colonoscopy every 10 years starting at age 86. Hepatitis C blood test. Hepatitis B blood test. Sexually transmitted disease (STD) testing. Diabetes screening. This is done by checking your blood sugar (glucose) after you have not eaten for a while (fasting). You may have this done every 1-3 years. Bone density scan. This is done to screen for osteoporosis. You may have this done starting at age 63. Mammogram. This may be done every 1-2 years. Talk to your health care provider about how often you should have regular mammograms. Talk with your health care provider about your test results, treatment options, and if necessary, the need for more tests. Vaccines  Your health care provider may recommend certain vaccines, such as: Influenza vaccine. This is recommended every year. Tetanus, diphtheria, and acellular pertussis (Tdap, Td) vaccine. You may need a Td booster every 10 years. Zoster vaccine. You may need this after age 31. Pneumococcal 13-valent conjugate (PCV13) vaccine. One dose is recommended after age 29. Pneumococcal polysaccharide (PPSV23) vaccine. One dose is recommended after age 19. Talk to your health care  provider about which screenings and vaccines you need and how  often you need them. This information is not intended to replace advice given to you by your health care provider. Make sure you discuss any questions you have with your health care provider. Document Released: 07/01/2015 Document Revised: 02/22/2016 Document Reviewed: 04/05/2015 Elsevier Interactive Patient Education  2017 [employer] Prevention in the Home Falls can cause injuries. They can happen to people of all ages. There are many things you can do to make your home safe and to help prevent falls. What can I do on the outside of my home? Regularly fix the edges of walkways and driveways and fix any cracks. Remove anything that might make you trip as you walk through a door, such as a raised step or threshold. Trim any bushes or trees on the path to your home. Use bright outdoor lighting. Clear any walking paths of anything that might make someone trip, such as rocks or tools. Regularly check to see if handrails are loose or broken. Make sure that both sides of any steps have handrails. Any raised decks and porches should have guardrails on the edges. Have any leaves, snow, or ice cleared regularly. Use sand or salt on walking paths during winter. Clean up any spills in your garage right away. This includes oil or grease spills. What can I do in the bathroom? Use night lights. Install grab bars by the toilet and in the tub and shower. Do not use towel bars as grab bars. Use non-skid mats or decals in the tub or shower. If you need to sit down in the shower, use a plastic, non-slip stool. Keep the floor dry. Clean up any water that spills on the floor as soon as it happens. Remove soap buildup in the tub or shower regularly. Attach bath mats securely with double-sided non-slip rug tape. Do not have throw rugs and other things on the floor that can make you trip. What can I do in the bedroom? Use night lights. Make sure that you have a light by your bed that is easy to  reach. Do not use any sheets or blankets that are too big for your bed. They should not hang down onto the floor. Have a firm chair that has side arms. You can use this for support while you get dressed. Do not have throw rugs and other things on the floor that can make you trip. What can I do in the kitchen? Clean up any spills right away. Avoid walking on wet floors. Keep items that you use a lot in easy-to-reach places. If you need to reach something above you, use a strong step stool that has a grab bar. Keep electrical cords out of the way. Do not use floor polish or wax that makes floors slippery. If you must use wax, use non-skid floor wax. Do not have throw rugs and other things on the floor that can make you trip. What can I do with my stairs? Do not leave any items on the stairs. Make sure that there are handrails on both sides of the stairs and use them. Fix handrails that are broken or loose. Make sure that handrails are as long as the stairways. Check any carpeting to make sure that it is firmly attached to the stairs. Fix any carpet that is loose or worn. Avoid having throw rugs at the top or bottom of the stairs. If you do have throw rugs, attach them to the  floor with carpet tape. Make sure that you have a light switch at the top of the stairs and the bottom of the stairs. If you do not have them, ask someone to add them for you. What else can I do to help prevent falls? Wear shoes that: Do not have high heels. Have rubber bottoms. Are comfortable and fit you well. Are closed at the toe. Do not wear sandals. If you use a stepladder: Make sure that it is fully opened. Do not climb a closed stepladder. Make sure that both sides of the stepladder are locked into place. Ask someone to hold it for you, if possible. Clearly mark and make sure that you can see: Any grab bars or handrails. First and last steps. Where the edge of each step is. Use tools that help you move  around (mobility aids) if they are needed. These include: Canes. Walkers. Scooters. Crutches. Turn on the lights when you go into a dark area. Replace any light bulbs as soon as they burn out. Set up your furniture so you have a clear path. Avoid moving your furniture around. If any of your floors are uneven, fix them. If there are any pets around you, be aware of where they are. Review your medicines with your doctor. Some medicines can make you feel dizzy. This can increase your chance of falling. Ask your doctor what other things that you can do to help prevent falls. This information is not intended to replace advice given to you by your health care provider. Make sure you discuss any questions you have with your health care provider. Document Released: 03/31/2009 Document Revised: 11/10/2015 Document Reviewed: 07/09/2014 Elsevier Interactive Patient Education  2017 [employer].

## 2020-12-13 NOTE — Progress Notes (Signed)
Subjective:   Martha Swanson is a 75 y.o. female who presents for Medicare Annual (Subsequent) preventive examination.  I connected with  Martha Swanson on 12/13/20 by a telephone enabled telemedicine application and verified that I am speaking with the correct person using two identifiers.   I discussed the limitations of evaluation and management by telemedicine. The patient expressed understanding and agreed to proceed.  Patient location: home  Provider location: Tele-Health Visit    Review of Systems    na Cardiac Risk Factors include: advanced age (>70men, >46 women)     Objective:    There were no vitals filed for this visit. There is no height or weight on file to calculate BMI.  Advanced Directives 12/13/2020 04/01/2013  Does Patient Have a Medical Advance Directive? Yes Patient has advance directive, copy not in chart  Type of Advance Directive [place];Living will Living will  Copy of [place] in Chart? No - copy requested -  Would patient like information on creating a medical advance directive? No - Patient declined -  Pre-existing out of facility DNR order (yellow form or pink MOST form) - No    Current Medications (verified) Outpatient Encounter Medications as of 12/13/2020  Medication Sig   alendronate (FOSAMAX) 70 MG tablet Take 1 tablet by mouth once a week   calcium carbonate (OS-CAL) 600 MG TABS tablet Take 1 tablet (600 mg total) by mouth 2 (two) times daily with a meal.   levothyroxine (SYNTHROID) 50 MCG tablet TAKE 1 TABLET BY MOUTH BEFORE BREAKFAST -NEED  FOLLOW  UP  AND  LABS  FOR  FURTHER  REFILLS   Vitamin D, Ergocalciferol, (DRISDOL) 1.25 MG (50000 UT) CAPS capsule Take 1 capsule (50,000 Units total) by mouth every 7 (seven) days. (Patient not taking: No sig reported)   [DISCONTINUED] atorvastatin (LIPITOR) 10 MG tablet Take 1 tablet by mouth Daily.   No facility-administered encounter medications on file  as of 12/13/2020.    Allergies (verified) Penicillins   History: Past Medical History:  Diagnosis Date   Heart disease    Takotsubo cardiomyopathy     cardiac catheterization initial ejection fraction 20% no coronary artery disease ejection fraction improved after 5 days    Unspecified hypothyroidism    Past Surgical History:  Procedure Laterality Date   ABDOMINAL HYSTERECTOMY     COLONOSCOPY     COLONOSCOPY N/A 04/01/2013   Procedure: COLONOSCOPY;  Surgeon: Rogene Houston, MD;  [facility];  Service: Endoscopy;  Laterality: N/A;  [place]   TONSILLECTOMY     Family History  Problem Relation Age of Onset   Heart attack Father 58   Early death Father    Arthritis Mother    COPD Mother    Hypertension Mother    Arthritis Brother    Asthma Brother    Hypertension Brother    Cancer Maternal Grandfather    Arthritis Brother    Colon cancer Neg Hx    Social History   Socioeconomic History   Marital status: Married    Spouse name: Not on file   Number of children: Not on file   Years of education: Not on file   Highest education level: Not on file  Occupational History    Comment: Part-time pastor  Tobacco Use   Smoking status: Never   Smokeless tobacco: Never  Vaping Use   Vaping Use: Never used  Substance and Sexual  Activity   Alcohol use: Yes    Comment: occasional wine   Drug use: No   Sexual activity: Not on file  Other Topics Concern   Not on file  Social History Narrative   Not on file   Social Determinants of Health   Financial Resource Strain: Low Risk    Difficulty of Paying Living Expenses: Not hard at all  Food Insecurity: No Food Insecurity   Worried About [place] in the Last Year: Never true   [place] in the Last Year: Never true  Transportation Needs: No Transportation Needs   Lack of Transportation (Medical): No   Lack of Transportation (Non-Medical): No  Physical Activity: Sufficiently  Active   Days of Exercise per Week: 5 days   Minutes of Exercise per Session: 40 min  Stress: No Stress Concern Present   Feeling of Stress : Not at all  Social Connections: Socially Integrated   Frequency of Communication with Friends and Family: More than three times a week   Frequency of Social Gatherings with Friends and Family: More than three times a week   Attends Religious Services: More than 4 times per year   Active Member of [employer] or Organizations: Yes   Attends Music therapist: More than 4 times per year   Marital Status: Married    Tobacco Counseling Counseling given: Not Answered   Clinical Intake:  Pre-visit preparation completed: Yes  Pain : No/denies pain     Nutritional Risks: None Diabetes: No  How often do you need to have someone help you when you read instructions, pamphlets, or other written materials from your doctor or pharmacy?: 1 - Never  Diabetic?  NO  Interpreter Needed?: No  Information entered by :: Leroy Kennedy LPN   Activities of Daily Living In your present state of health, do you have any difficulty performing the following activities: 12/13/2020  Hearing? N  Vision? N  Difficulty concentrating or making decisions? N  Walking or climbing stairs? N  Dressing or bathing? N  Doing errands, shopping? N  Preparing Food and eating ? N  Using the Toilet? N  In the past six months, have you accidently leaked urine? N  Do you have problems with loss of bowel control? N  Managing your Medications? N  Managing your Finances? N  Housekeeping or managing your Housekeeping? N  Some recent data might be hidden    Patient Care Team: Libby Maw, MD as PCP - General (Family Medicine)  Indicate any recent Medical Services you may have received from other than [facility] providers in the past year (date may be approximate).     Assessment:   This is a routine wellness examination for Martha Swanson.  Hearing/Vision  screen Hearing Screening - Comments:: No trouble hearing Vision Screening - Comments:: Up to date [place] issues and exercise activities discussed: Current Exercise Habits: Structured exercise class, Type of exercise: walking, Time (Minutes): 40, Frequency (Times/Week): 3, Weekly Exercise (Minutes/Week): 120, Intensity: Moderate   Goals Addressed             This Visit's Progress    Increase physical activity         Depression Screen PHQ 2/9 Scores 12/13/2020 06/25/2019 09/04/2017  PHQ - 2 Score 0 0 0    Fall Risk Fall Risk  12/13/2020 06/25/2019 01/16/2019 09/04/2017  Falls in the past year? 0 0 (No Data) No  Comment - - Emmi Telephone  Survey: data to providers prior to load -  Number falls in past yr: 0 - (No Data) -  Comment - - Emmi Telephone Survey Actual Response =  -  Injury with Fall? 0 - - -  Follow up Falls evaluation completed;Falls prevention discussed - - -    FALL RISK PREVENTION PERTAINING TO THE HOME:  Any stairs in or around the home? No  If so, are there any without handrails? No  Home free of loose throw rugs in walkways, pet beds, electrical cords, etc? Yes  Adequate lighting in your home to reduce risk of falls? Yes   ASSISTIVE DEVICES UTILIZED TO PREVENT FALLS:  Life alert? No  Use of a cane, walker or w/c? No  Grab bars in the bathroom? No  Shower chair or bench in shower? No  Elevated toilet seat or a handicapped toilet? No   TIMED UP AND GO:  Was the test performed? No .   Tele-health visit    Cognitive Function:  Normal cognitive status assessed by direct observation by this Nurse Health Advisor. No abnormalities found.          Immunizations Immunization History  Administered Date(s) Administered   Influenza,inj,Quad PF,6+ Mos 03/16/2017, 03/08/2018, 03/28/2019   Influenza-Unspecified 03/18/2013, 03/05/2020   PFIZER(Purple Top)SARS-COV-2 Vaccination 06/30/2019, 07/20/2019, 03/16/2020, 09/17/2020   Pneumococcal  Polysaccharide-23 06/09/2013   Tdap 07/06/2015   Zoster Recombinat (Shingrix) 01/28/2019, 04/03/2019    TDAP status: Up to date  Flu Vaccine status: Up to date  Pneumococcal vaccine status: Due, Education has been provided regarding the importance of this vaccine. Advised may receive this vaccine at local pharmacy or Health Dept. Aware to provide a copy of the vaccination record if obtained from local pharmacy or Health Dept. Verbalized acceptance and understanding.  Covid-19 vaccine status: Completed vaccines  Qualifies for Shingles Vaccine? Yes   Zostavax completed No   Shingrix Completed?: Yes  Screening Tests Health Maintenance  Topic Date Due   Hepatitis C Screening  Never done   PNA vac Low Risk Adult (2 of 2 - PCV13) 06/09/2014   INFLUENZA VACCINE  01/16/2021   COLONOSCOPY (Pts 45-82yrs Insurance coverage will need to be confirmed)  04/02/2023   TETANUS/TDAP  07/05/2025   DEXA SCAN  Completed   COVID-19 Vaccine  Completed   Zoster Vaccines- Shingrix  Completed   HPV VACCINES  Aged Out    Health Maintenance  Health Maintenance Due  Topic Date Due   Hepatitis C Screening  Never done   PNA vac Low Risk Adult (2 of 2 - PCV13) 06/09/2014    Colorectal cancer screening: No longer required.   Mammogram status: Completed  . Repeat every year  Bone Density status: Ordered  . Pt provided with contact info and advised to call to schedule appt.  Lung Cancer Screening: (Low Dose CT Chest recommended if Age 67-80 years, 30 pack-year currently smoking OR have quit w/in 15years.) does not qualify.   Lung Cancer Screening Referral: na  Additional Screening:  Hepatitis C Screening: does qualify  Vision Screening: Recommended annual ophthalmology exams for early detection of glaucoma and other disorders of the eye. Is the patient up to date with their annual eye exam?  Yes  Who is the provider or what is the name of the office in which the patient attends annual eye exams?  [facility] If pt is not established with a provider, would they like to be referred to a provider to establish care?  established .  Dental Screening: Recommended annual dental exams for proper oral hygiene  Community Resource Referral / Chronic Care Management: CRR required this visit?  No   CCM required this visit?  No      Plan:     I have personally reviewed and noted the following in the patient's chart:   Medical and social history Use of alcohol, tobacco or illicit drugs  Current medications and supplements including opioid prescriptions.  Functional ability and status Nutritional status Physical activity Advanced directives List of other physicians Hospitalizations, surgeries, and ER visits in previous 12 months Vitals Screenings to include cognitive, depression, and falls Referrals and appointments  In addition, I have reviewed and discussed with patient certain preventive protocols, quality metrics, and best practice recommendations. A written personalized care plan for preventive services as well as general preventive health recommendations were provided to patient.     Leroy Kennedy, LPN   8/41/2820   Nurse Notes: na

## 2020-12-26 ENCOUNTER — Other Ambulatory Visit: Payer: Self-pay

## 2020-12-27 ENCOUNTER — Encounter: Payer: Self-pay | Admitting: Family Medicine

## 2020-12-27 ENCOUNTER — Ambulatory Visit (INDEPENDENT_AMBULATORY_CARE_PROVIDER_SITE_OTHER): Payer: Medicare Other | Admitting: Family Medicine

## 2020-12-27 VITALS — BP 130/78 | HR 73 | Temp 96.7°F | Ht <= 58 in | Wt 88.4 lb

## 2020-12-27 DIAGNOSIS — E7800 Pure hypercholesterolemia, unspecified: Secondary | ICD-10-CM

## 2020-12-27 DIAGNOSIS — Z0000 Encounter for general adult medical examination without abnormal findings: Secondary | ICD-10-CM

## 2020-12-27 DIAGNOSIS — E039 Hypothyroidism, unspecified: Secondary | ICD-10-CM | POA: Diagnosis not present

## 2020-12-27 DIAGNOSIS — E559 Vitamin D deficiency, unspecified: Secondary | ICD-10-CM | POA: Diagnosis not present

## 2020-12-27 LAB — CBC
HCT: 41.3 % (ref 36.0–46.0)
Hemoglobin: 14.1 g/dL (ref 12.0–15.0)
MCHC: 34 g/dL (ref 30.0–36.0)
MCV: 90.2 fl (ref 78.0–100.0)
Platelets: 293 10*3/uL (ref 150.0–400.0)
RBC: 4.58 Mil/uL (ref 3.87–5.11)
RDW: 13.3 % (ref 11.5–15.5)
WBC: 4.2 10*3/uL (ref 4.0–10.5)

## 2020-12-27 LAB — URINALYSIS, ROUTINE W REFLEX MICROSCOPIC
Bilirubin Urine: NEGATIVE
Hgb urine dipstick: NEGATIVE
Ketones, ur: NEGATIVE
Leukocytes,Ua: NEGATIVE
Nitrite: NEGATIVE
RBC / HPF: NONE SEEN (ref 0–?)
Specific Gravity, Urine: 1.01 (ref 1.000–1.030)
Total Protein, Urine: NEGATIVE
Urine Glucose: NEGATIVE
Urobilinogen, UA: 0.2 (ref 0.0–1.0)
WBC, UA: NONE SEEN (ref 0–?)
pH: 6 (ref 5.0–8.0)

## 2020-12-27 LAB — COMPREHENSIVE METABOLIC PANEL
ALT: 17 U/L (ref 0–35)
AST: 17 U/L (ref 0–37)
Albumin: 4.5 g/dL (ref 3.5–5.2)
Alkaline Phosphatase: 61 U/L (ref 39–117)
BUN: 12 mg/dL (ref 6–23)
CO2: 29 mEq/L (ref 19–32)
Calcium: 10 mg/dL (ref 8.4–10.5)
Chloride: 104 mEq/L (ref 96–112)
Creatinine, Ser: 0.86 mg/dL (ref 0.40–1.20)
GFR: 66.18 mL/min (ref >60.00)
Glucose, Bld: 90 mg/dL (ref 70–99)
Potassium: 4.5 mEq/L (ref 3.5–5.1)
Sodium: 140 mEq/L (ref 135–145)
Total Bilirubin: 0.5 mg/dL (ref 0.2–1.2)
Total Protein: 6.4 g/dL (ref 6.0–8.3)

## 2020-12-27 LAB — LIPID PANEL
Cholesterol: 202 mg/dL — ABNORMAL HIGH (ref 0–200)
HDL: 82 mg/dL (ref >39.00)
LDL Cholesterol: 106 mg/dL — ABNORMAL HIGH (ref 0–99)
NonHDL: 119.73
Total CHOL/HDL Ratio: 2
Triglycerides: 68 mg/dL (ref 0.0–149.0)
VLDL: 13.6 mg/dL (ref 0.0–40.0)

## 2020-12-27 LAB — TSH: TSH: 1.05 u[IU]/mL (ref 0.35–5.50)

## 2020-12-27 LAB — VITAMIN D 25 HYDROXY (VIT D DEFICIENCY, FRACTURES): VITD: 57.88 ng/mL (ref 30.00–100.00)

## 2020-12-27 NOTE — Progress Notes (Signed)
Established Patient Office Visit  Subjective:  Patient ID: Martha Swanson, female    DOB: 1946/03/02  Age: 75 y.o. MRN: [id number]  CC:  Chief Complaint  Patient presents with   Annual Exam    CPE, no concerns. Patient fasting for labs.     HPI [name] presents for yearly physical and follow-up of hypothyroidism, elevated LDL, vitamin D deficiency and osteoporosis.  Continues to exercise by walking.  Intentional weight loss.  Feels great.  Past Medical History:  Diagnosis Date   Heart disease    Takotsubo cardiomyopathy     cardiac catheterization initial ejection fraction 20% no coronary artery disease ejection fraction improved after 5 days    Unspecified hypothyroidism     Past Surgical History:  Procedure Laterality Date   ABDOMINAL HYSTERECTOMY     COLONOSCOPY     COLONOSCOPY N/A 04/01/2013   Procedure: COLONOSCOPY;  Surgeon: Rogene Houston, MD;  [facility];  Service: Endoscopy;  Laterality: N/A;  [name]   TONSILLECTOMY      Family History  Problem Relation Age of Onset   Heart attack Father 30   Early death Father    Arthritis Mother    COPD Mother    Hypertension Mother    Arthritis Brother    Asthma Brother    Hypertension Brother    Cancer Maternal Grandfather    Arthritis Brother    Colon cancer Neg Hx     Social History   Socioeconomic History   Marital status: Married    Spouse name: Not on file   Number of children: Not on file   Years of education: Not on file   Highest education level: Not on file  Occupational History    Comment: Part-time pastor  Tobacco Use   Smoking status: Never   Smokeless tobacco: Never  Vaping Use   Vaping Use: Never used  Substance and Sexual Activity   Alcohol use: Yes    Comment: occasional wine   Drug use: No   Sexual activity: Not on file  Other Topics Concern   Not on file  Social History Narrative   Not on file   Social Determinants of Health    Financial Resource Strain: Low Risk    Difficulty of Paying Living Expenses: Not hard at all  Food Insecurity: No Food Insecurity   Worried About [place] fund[place] in the Last Year: Never true   [place] in the Last Year: Never true  Transportation Needs: No Transportation Needs   Lack of Transportation (Medical): No   Lack of Transportation (Non-Medical): No  Physical Activity: Sufficiently Active   Days of Exercise per Week: 5 days   Minutes of Exercise per Session: 40 min  Stress: No Stress Concern Present   Feeling of Stress : Not at all  Social Connections: Socially Integrated   Frequency of Communication with Friends and Family: More than three times a week   Frequency of Social Gatherings with Friends and Family: More than three times a week   Attends Religious Services: More than 4 times per year   Active Member of [employer] or Organizations: Yes   Attends Music therapist: More than 4 times per year   Marital Status: Married  Human resources officer Violence: Not At Risk   Fear of Current or Ex-Partner: No   Emotionally Abused: No   Physically Abused: No   Sexually Abused: No  Outpatient Medications Prior to Visit  Medication Sig Dispense Refill   alendronate (FOSAMAX) 70 MG tablet Take 1 tablet by mouth once a week 12 tablet 0   calcium carbonate (OS-CAL) 600 MG TABS tablet Take 1 tablet (600 mg total) by mouth 2 (two) times daily with a meal. 60 tablet 12   levothyroxine (SYNTHROID) 50 MCG tablet TAKE 1 TABLET BY MOUTH BEFORE BREAKFAST -NEED  FOLLOW  UP  AND  LABS  FOR  FURTHER  REFILLS 90 tablet 0   Vitamin D, Ergocalciferol, (DRISDOL) 1.25 MG (50000 UT) CAPS capsule Take 1 capsule (50,000 Units total) by mouth every 7 (seven) days. (Patient not taking: No sig reported) 15 capsule 2   No facility-administered medications prior to visit.    Allergies  Allergen Reactions   Penicillins Anaphylaxis    ROS Review of Systems  Constitutional:  Negative.   HENT: Negative.    Eyes:  Negative for photophobia and visual disturbance.  Respiratory: Negative.    Cardiovascular: Negative.   Gastrointestinal: Negative.  Negative for anal bleeding, blood in stool and constipation.  Endocrine: Negative for polyphagia and polyuria.  Genitourinary: Negative.  Negative for decreased urine volume, difficulty urinating, frequency and urgency.  Musculoskeletal:  Negative for gait problem and joint swelling.  Skin: Negative.   Neurological:  Negative for speech difficulty and weakness.  Psychiatric/Behavioral:  Negative for dysphoric mood. The patient is not nervous/anxious.      Objective:    Physical Exam Vitals and nursing note reviewed.  Constitutional:      General: She is not in acute distress.    Appearance: Normal appearance. She is normal weight. She is not ill-appearing, toxic-appearing or diaphoretic.  HENT:     Head: Normocephalic and atraumatic.     Right Ear: Tympanic membrane, ear canal and external ear normal.     Left Ear: Tympanic membrane, ear canal and external ear normal.     Mouth/Throat:     Mouth: Mucous membranes are moist.     Pharynx: Oropharynx is clear. No oropharyngeal exudate or posterior oropharyngeal erythema.  Eyes:     General: No scleral icterus.       Right eye: No discharge.        Left eye: No discharge.     Extraocular Movements: Extraocular movements intact.     Conjunctiva/sclera: Conjunctivae normal.     Pupils: Pupils are equal, round, and reactive to light.  Neck:     Vascular: No carotid bruit.  Cardiovascular:     Rate and Rhythm: Normal rate and regular rhythm.  Pulmonary:     Effort: Pulmonary effort is normal.     Breath sounds: Normal breath sounds.  Abdominal:     General: Abdomen is flat. Bowel sounds are normal. There is no distension.     Palpations: Abdomen is soft. There is no mass.     Tenderness: There is no abdominal tenderness. There is no guarding or rebound.      Hernia: No hernia is present.  Musculoskeletal:     Cervical back: No rigidity or tenderness.  Lymphadenopathy:     Cervical: No cervical adenopathy.  Skin:    General: Skin is warm and dry.  Neurological:     Mental Status: She is alert and oriented to person, place, and time.  Psychiatric:        Mood and Affect: Mood normal.        Behavior: Behavior normal.    BP 130/78   Pulse  73   Temp (!) 96.7 F (35.9 C) (Temporal)   Ht 4\' 10"  (1.473 m)   Wt 88 lb 6.4 oz (40.1 kg)   SpO2 98%   BMI 18.48 kg/m  Wt Readings from Last 3 Encounters:  12/27/20 88 lb 6.4 oz (40.1 kg)  12/24/19 105 lb 3.2 oz (47.7 kg)  06/25/19 105 lb 12.8 oz (48 kg)     Health Maintenance Due  Topic Date Due   Hepatitis C Screening  Never done   PNA vac Low Risk Adult (2 of 2 - PCV13) 06/09/2014    There are no preventive care reminders to display for this patient.  Lab Results  Component Value Date   TSH 1.00 12/24/2019   Lab Results  Component Value Date   WBC 7.0 06/25/2019   HGB 13.7 06/25/2019   HCT 41.3 06/25/2019   MCV 89.8 06/25/2019   PLT 317.0 06/25/2019   Lab Results  Component Value Date   NA 139 06/25/2019   K 3.9 06/25/2019   CO2 26 06/25/2019   GLUCOSE 92 06/25/2019   BUN 15 06/25/2019   CREATININE 0.89 06/25/2019   BILITOT 0.5 06/25/2019   ALKPHOS 55 06/25/2019   AST 13 06/25/2019   ALT 10 06/25/2019   PROT 6.6 06/25/2019   ALBUMIN 4.1 06/25/2019   CALCIUM 9.8 06/25/2019   GFR 62.05 06/25/2019   Lab Results  Component Value Date   CHOL 201 (H) 06/25/2019   Lab Results  Component Value Date   HDL 63.60 06/25/2019   Lab Results  Component Value Date   LDLCALC 120 (H) 06/25/2019   Lab Results  Component Value Date   TRIG 87.0 06/25/2019   Lab Results  Component Value Date   CHOLHDL 3 06/25/2019   No results found for: HGBA1C    Assessment & Plan:   Problem List Items Addressed This Visit       Endocrine   Acquired hypothyroidism - Primary    Relevant Orders   TSH     Other   Vitamin D deficiency   Relevant Orders   VITAMIN D 25 Hydroxy (Vit-D Deficiency, Fractures)   Elevated LDL cholesterol level   Relevant Orders   CBC   Comprehensive metabolic panel   Lipid panel   Healthcare maintenance   Relevant Orders   Urinalysis, Routine w reflex microscopic    No orders of the defined types were placed in this encounter.   Follow-up: Return in about 6 months (around 06/29/2021).   Encouraged her with her healthy lifestyle.  Keep exercising.  Continue all medicines as above.  Believes she has been taking Fosamax for over 5 years now.  DEXA scan has been ordered.  Follow-up in 6 months.  She was given information on health maintenance and disease prevention for approximately 74 also given information on hypothyroidism and vitamin D deficiency and preventing high cholesterol. Libby Maw, MD

## 2021-01-10 ENCOUNTER — Other Ambulatory Visit: Payer: Self-pay | Admitting: Family Medicine

## 2021-01-10 DIAGNOSIS — E039 Hypothyroidism, unspecified: Secondary | ICD-10-CM

## 2021-03-08 DIAGNOSIS — Z23 Encounter for immunization: Secondary | ICD-10-CM | POA: Diagnosis not present

## 2021-06-20 ENCOUNTER — Ambulatory Visit: Admission: RE | Admit: 2021-06-20 | Payer: Medicare Other | Source: Ambulatory Visit

## 2021-06-23 ENCOUNTER — Encounter: Payer: Self-pay | Admitting: Family Medicine

## 2021-06-23 NOTE — Addendum Note (Signed)
Addended by: Marchia Bond on: 06/23/2021 11:46 AM   Modules accepted: Orders

## 2021-06-29 ENCOUNTER — Other Ambulatory Visit: Payer: Self-pay

## 2021-06-29 ENCOUNTER — Ambulatory Visit: Payer: Medicare Other | Admitting: Family Medicine

## 2021-06-29 ENCOUNTER — Ambulatory Visit (INDEPENDENT_AMBULATORY_CARE_PROVIDER_SITE_OTHER)
Admission: RE | Admit: 2021-06-29 | Discharge: 2021-06-29 | Disposition: A | Discharge status: Home / Self Care | Payer: Medicare Other | Source: Ambulatory Visit | Attending: Family Medicine | Admitting: Family Medicine

## 2021-06-29 DIAGNOSIS — M810 Age-related osteoporosis without current pathological fracture: Secondary | ICD-10-CM | POA: Diagnosis not present

## 2021-07-13 ENCOUNTER — Other Ambulatory Visit: Payer: Self-pay

## 2021-07-14 ENCOUNTER — Ambulatory Visit (INDEPENDENT_AMBULATORY_CARE_PROVIDER_SITE_OTHER): Payer: Medicare Other | Admitting: Family Medicine

## 2021-07-14 ENCOUNTER — Encounter: Payer: Self-pay | Admitting: Family Medicine

## 2021-07-14 VITALS — BP 136/74 | HR 73 | Temp 97.6°F | Ht <= 58 in | Wt 89.0 lb

## 2021-07-14 DIAGNOSIS — E039 Hypothyroidism, unspecified: Secondary | ICD-10-CM

## 2021-07-14 DIAGNOSIS — Z23 Encounter for immunization: Secondary | ICD-10-CM

## 2021-07-14 DIAGNOSIS — M810 Age-related osteoporosis without current pathological fracture: Secondary | ICD-10-CM | POA: Diagnosis not present

## 2021-07-14 LAB — TSH: TSH: 2.1 u[IU]/mL (ref 0.35–5.50)

## 2021-07-14 NOTE — Progress Notes (Signed)
Established Patient Office Visit  Subjective:  Patient ID: Martha Swanson, female    DOB: 05-14-1946  Age: 76 y.o. MRN: [id number]  CC:  Chief Complaint  Patient presents with   Follow-up    6 month follow up, no concerns. Patient fasting.     HPI Martha Swanson presents for follow-up of hypothyroidism and osteoporosis.  Follow-up DEXA can [name] 14% decline in her bone density testing.  She has been compliant with her weekly Fosamax along with 1200 mg of calcium and 800 international units of vitamin D daily.  She is walking 5 days a week.  She is taking the Fosamax for the last 8 years.  Continues with levothyroxine for hypothyroidism  Past Medical History:  Diagnosis Date   Heart disease    Takotsubo cardiomyopathy     cardiac catheterization initial ejection fraction 20% no coronary artery disease ejection fraction improved after 5 days    Unspecified hypothyroidism     Past Surgical History:  Procedure Laterality Date   ABDOMINAL HYSTERECTOMY     COLONOSCOPY     COLONOSCOPY N/A 04/01/2013   Procedure: COLONOSCOPY;  Surgeon: Rogene Houston, MD;  [facility];  Service: Endoscopy;  Laterality: N/A;  [place]   TONSILLECTOMY      Family History  Problem Relation Age of Onset   Heart attack Father 6   Early death Father    Arthritis Mother    COPD Mother    Hypertension Mother    Arthritis Brother    Asthma Brother    Hypertension Brother    Cancer Maternal Grandfather    Arthritis Brother    Colon cancer Neg Hx     Social History   Socioeconomic History   Marital status: Married    Spouse name: Not on file   Number of children: Not on file   Years of education: Not on file   Highest education level: Not on file  Occupational History    Comment: Part-time pastor  Tobacco Use   Smoking status: Never   Smokeless tobacco: Never  Vaping Use   Vaping Use: Never used  Substance and Sexual Activity   Alcohol use: Yes     Comment: occasional wine   Drug use: No   Sexual activity: Not on file  Other Topics Concern   Not on file  Social History Narrative   Not on file   Social Determinants of Health   Financial Resource Strain: Low Risk    Difficulty of Paying Living Expenses: Not hard at all  Food Insecurity: No Food Insecurity   Worried About Charity fundraiser in the Last Year: Never true   [place] in the Last Year: Never true  Transportation Needs: No Transportation Needs   Lack of Transportation (Medical): No   Lack of Transportation (Non-Medical): No  Physical Activity: Sufficiently Active   Days of Exercise per Week: 5 days   Minutes of Exercise per Session: 40 min  Stress: No Stress Concern Present   Feeling of Stress : Not at all  Social Connections: Socially Integrated   Frequency of Communication with Friends and Family: More than three times a week   Frequency of Social Gatherings with Friends and Family: More than three times a week   Attends Religious Services: More than 4 times per year   Active Member of [employer] or Organizations: Yes   Attends Archivist Meetings: More than 4  times per year   Marital Status: Married  Human resources officer Violence: Not At Risk   Fear of Current or Ex-Partner: No   Emotionally Abused: No   Physically Abused: No   Sexually Abused: No    Outpatient Medications Prior to Visit  Medication Sig Dispense Refill   alendronate (FOSAMAX) 70 MG tablet Take 1 tablet by mouth once a week 12 tablet 0   calcium carbonate (OS-CAL) 600 MG TABS tablet Take 1 tablet (600 mg total) by mouth 2 (two) times daily with a meal. 60 tablet 12   levothyroxine (SYNTHROID) 50 MCG tablet TAKE 1 TABLET BY MOUTH ONCE DAILY BEFORE BREAKFAST . APPOINTMENT REQUIRED FOR FUTURE REFILLS 90 tablet 2   No facility-administered medications prior to visit.    Allergies  Allergen Reactions   Penicillins Anaphylaxis    ROS Review of Systems  Constitutional:  Negative  for diaphoresis, fatigue, fever and unexpected weight change.  Respiratory: Negative.    Cardiovascular: Negative.   Gastrointestinal: Negative.   Genitourinary: Negative.   Musculoskeletal:  Negative for arthralgias.  Neurological:  Negative for speech difficulty and weakness.  Psychiatric/Behavioral: Negative.       Objective:    Physical Exam Vitals and nursing note reviewed.  Constitutional:      General: She is not in acute distress.    Appearance: Normal appearance. She is not ill-appearing, toxic-appearing or diaphoretic.  HENT:     Head: Normocephalic and atraumatic.     Right Ear: External ear normal.     Left Ear: External ear normal.     Mouth/Throat:     Mouth: Mucous membranes are moist.     Pharynx: Oropharynx is clear. No oropharyngeal exudate or posterior oropharyngeal erythema.  Eyes:     General: No scleral icterus.       Right eye: No discharge.        Left eye: No discharge.     Extraocular Movements: Extraocular movements intact.     Conjunctiva/sclera: Conjunctivae normal.     Pupils: Pupils are equal, round, and reactive to light.  Neck:     Vascular: No carotid bruit.  Cardiovascular:     Rate and Rhythm: Normal rate and regular rhythm.  Pulmonary:     Effort: Pulmonary effort is normal.     Breath sounds: Normal breath sounds.  Musculoskeletal:     Cervical back: No rigidity or tenderness.  Lymphadenopathy:     Cervical: No cervical adenopathy.  Skin:    General: Skin is warm and dry.  Neurological:     Mental Status: She is alert and oriented to person, place, and time.  Psychiatric:        Mood and Affect: Mood normal.        Behavior: Behavior normal.    BP 136/74 (BP Location: Right Arm, Patient Position: Sitting, Cuff Size: Normal)    Pulse 73    Temp 97.6 F (36.4 C) (Temporal)    Ht 4\' 10"  (1.473 m)    Wt 89 lb (40.4 kg)    SpO2 100%    BMI 18.60 kg/m  Wt Readings from Last 3 Encounters:  07/14/21 89 lb (40.4 kg)  12/27/20 88 lb  6.4 oz (40.1 kg)  12/24/19 105 lb 3.2 oz (47.7 kg)     Health Maintenance Due  Topic Date Due   Hepatitis C Screening  Never done    There are no preventive care reminders to display for this patient.  Lab Results  Component Value  Date   TSH 1.05 12/27/2020   Lab Results  Component Value Date   WBC 4.2 12/27/2020   HGB 14.1 12/27/2020   HCT 41.3 12/27/2020   MCV 90.2 12/27/2020   PLT 293.0 12/27/2020   Lab Results  Component Value Date   NA 140 12/27/2020   K 4.5 12/27/2020   CO2 29 12/27/2020   GLUCOSE 90 12/27/2020   BUN 12 12/27/2020   CREATININE 0.86 12/27/2020   BILITOT 0.5 12/27/2020   ALKPHOS 61 12/27/2020   AST 17 12/27/2020   ALT 17 12/27/2020   PROT 6.4 12/27/2020   ALBUMIN 4.5 12/27/2020   CALCIUM 10.0 12/27/2020   GFR 66.18 12/27/2020   Lab Results  Component Value Date   CHOL 202 (H) 12/27/2020   Lab Results  Component Value Date   HDL 82.00 12/27/2020   Lab Results  Component Value Date   LDLCALC 106 (H) 12/27/2020   Lab Results  Component Value Date   TRIG 68.0 12/27/2020   Lab Results  Component Value Date   CHOLHDL 2 12/27/2020   No results found for: HGBA1C    Assessment & Plan:   Problem List Items Addressed This Visit       Endocrine   Acquired hypothyroidism - Primary   Relevant Orders   TSH     Musculoskeletal and Integument   Age-related osteoporosis without current pathological fracture   Other Visit Diagnoses     Need for vaccination against Streptococcus pneumoniae       Relevant Orders   Pneumococcal conjugate vaccine 20-valent (Prevnar 20) (Completed)       No orders of the defined types were placed in this encounter.   Follow-up: Return in about 6 months (around 01/11/2022).   With shared decision making we decided to continue the Fosamax for another 2 years because of her high risk for fracture.  She will continue exercising.  Continue calcium and vitamin D supplementation.  Discussed ways to make  her home safer to prevent falls.  Information given on osteoporosis and diet for this disease.  Advised her not to lose any more weight. Libby Maw, MD

## 2021-07-18 MED ORDER — LEVOTHYROXINE SODIUM 50 MCG PO TABS: ORAL_TABLET | ORAL | 2 refills | Status: DC

## 2021-07-18 NOTE — Addendum Note (Signed)
Addended by: Jon Billings on: 07/18/2021 07:55 AM   Modules accepted: Orders

## 2021-09-11 ENCOUNTER — Other Ambulatory Visit: Payer: Self-pay | Admitting: Family Medicine

## 2021-09-11 DIAGNOSIS — Z1231 Encounter for screening mammogram for malignant neoplasm of breast: Secondary | ICD-10-CM

## 2021-09-12 ENCOUNTER — Other Ambulatory Visit: Payer: Self-pay | Admitting: Family Medicine

## 2021-09-12 DIAGNOSIS — M810 Age-related osteoporosis without current pathological fracture: Secondary | ICD-10-CM

## 2021-10-25 ENCOUNTER — Ambulatory Visit
Admission: RE | Admit: 2021-10-25 | Discharge: 2021-10-25 | Disposition: A | Discharge status: Home / Self Care | Payer: Medicare Other | Source: Ambulatory Visit | Attending: Family Medicine | Admitting: Family Medicine

## 2021-10-25 DIAGNOSIS — Z1231 Encounter for screening mammogram for malignant neoplasm of breast: Secondary | ICD-10-CM | POA: Diagnosis not present

## 2021-11-21 ENCOUNTER — Telehealth: Payer: Self-pay | Admitting: Family Medicine

## 2021-12-15 ENCOUNTER — Telehealth: Payer: Self-pay | Admitting: Family Medicine

## 2021-12-15 NOTE — Telephone Encounter (Signed)
Left message for patient to call back and schedule Medicare Annual Wellness Visit (AWV).   Please offer to do virtually or by telephone.  Left office number and my jabber [phone].  Last AWV:12/13/2020  Please schedule at anytime with Nurse Health Advisor.

## 2021-12-27 ENCOUNTER — Ambulatory Visit (INDEPENDENT_AMBULATORY_CARE_PROVIDER_SITE_OTHER): Payer: Medicare Other

## 2021-12-27 DIAGNOSIS — Z0000 Encounter for general adult medical examination without abnormal findings: Secondary | ICD-10-CM | POA: Diagnosis not present

## 2021-12-27 NOTE — Progress Notes (Signed)
Subjective:   Martha Swanson is a 76 y.o. female who presents for Medicare Annual (Subsequent) preventive examination.   I connected with Martha Swanson today by telephone and verified that I am speaking with the correct person using two identifiers. Location patient: home Location provider: work Persons participating in the virtual visit: patient, provider.   I discussed the limitations, risks, security and privacy concerns of performing an evaluation and management service by telephone and the availability of in person appointments. I also discussed with the patient that there may be a patient responsible charge related to this service. The patient expressed understanding and verbally consented to this telephonic visit.    Interactive audio and video telecommunications were attempted between this provider and patient, however failed, due to patient having technical difficulties OR patient did not have access to video capability.  We continued and completed visit with audio only.    Review of Systems     Cardiac Risk Factors include: advanced age (>46mn, >>55women)     Objective:    Today's Vitals   There is no height or weight on file to calculate BMI.     12/27/2021   12:48 PM 12/13/2020    3:03 PM 04/01/2013    7:33 AM  Advanced Directives  Does Patient Have a Medical Advance Directive? Yes Yes Patient has advance directive, copy not in chart  Type of Advance Directive [place] will Living will  Copy of [place] Chart? No - copy requested No - copy requested   Would patient like information on creating a medical advance directive?  No - Patient declined   Pre-existing out of facility DNR order (yellow form or pink MOST form)   No    Current Medications (verified) Outpatient Encounter Medications as of 12/27/2021  Medication Sig   alendronate (FOSAMAX) 70 MG tablet Take 1 tablet  by mouth once a week   calcium carbonate (OS-CAL) 600 MG TABS tablet Take 1 tablet (600 mg total) by mouth 2 (two) times daily with a meal.   levothyroxine (SYNTHROID) 50 MCG tablet TAKE 1 TABLET BY MOUTH ONCE DAILY BEFORE BREAKFAST . REFILLS   [DISCONTINUED] atorvastatin (LIPITOR) 10 MG tablet Take 1 tablet by mouth Daily.   No facility-administered encounter medications on file as of 12/27/2021.    Allergies (verified) Penicillins   History: Past Medical History:  Diagnosis Date   Heart disease    Takotsubo cardiomyopathy     cardiac catheterization initial ejection fraction 20% no coronary artery disease ejection fraction improved after 5 days    Unspecified hypothyroidism    Past Surgical History:  Procedure Laterality Date   ABDOMINAL HYSTERECTOMY     COLONOSCOPY     COLONOSCOPY N/A 04/01/2013   Procedure: COLONOSCOPY;  Surgeon: NRogene Houston MD;  [facility];  Service: Endoscopy;  Laterality: N/A;  [place]  TONSILLECTOMY     Family History  Problem Relation Age of Onset   Arthritis Mother    COPD Mother    Hypertension Mother    Heart attack Father [phone]  Early death Father    Breast cancer Maternal Aunt [phone]  Cancer Maternal Grandfather    Arthritis Brother    Asthma Brother    Hypertension Brother    Arthritis Brother    Colon cancer Neg Hx    Social History   Socioeconomic History   Marital  status: Married    Spouse name: Not on file   Number of children: Not on file   Years of education: Not on file   Highest education level: Not on file  Occupational History    Comment: Part-time pastor  Tobacco Use   Smoking status: Never   Smokeless tobacco: Never  Vaping Use   Vaping Use: Never used  Substance and Sexual Activity   Alcohol use: Yes    Comment: occasional wine   Drug use: No   Sexual activity: Not on file  Other Topics Concern   Not on file  Social History Narrative   Not on file   Social Determinants of  Health   Financial Resource Strain: Low Risk  (12/27/2021)   Overall Financial Resource Strain (CARDIA)    Difficulty of Paying Living Expenses: Not hard at all  Food Insecurity: No Food Insecurity (12/27/2021)   Hunger Vital Sign    Worried About Running Out of Food in the Last Year: Never true    Ran Out of Food in the Last Year: Never true  Transportation Needs: No Transportation Needs (12/27/2021)   PRAPARE - Hydrologist (Medical): No    Lack of Transportation (Non-Medical): No  Physical Activity: Sufficiently Active (12/27/2021)   Exercise Vital Sign    Days of Exercise per Week: 5 days    Minutes of Exercise per Session: 40 min  Stress: No Stress Concern Present (12/27/2021)   [place]    Feeling of Stress : Not at all  Social Connections: Moderately Integrated (12/27/2021)   Social Connection and Isolation Panel [NHANES]    Frequency of Communication with Friends and Family: Three times a week    Frequency of Social Gatherings with Friends and Family: Three times a week    Attends Religious Services: More than 4 times per year    Active Member of Clubs or Organizations: No    Attends Archivist Meetings: Never    Marital Status: Married    Tobacco Counseling Counseling given: Not Answered   Clinical Intake:  Pre-visit preparation completed: Yes  Pain : No/denies pain     Nutritional Risks: None  How often do you need to have someone help you when you read instructions, pamphlets, or other written materials from your doctor or pharmacy?: 1 - Never What is the last grade level you completed in school?: college  Diabetic?no   Interpreter Needed?: No  Information entered by :: [place] of Daily Living    12/27/2021   12:51 PM  In your present state of health, do you have any difficulty performing the following activities:  Hearing? 0   Vision? 0  Difficulty concentrating or making decisions? 0  Walking or climbing stairs? 0  Dressing or bathing? 0  Doing errands, shopping? 0  Preparing Food and eating ? N  Using the Toilet? N  In the past six months, have you accidently leaked urine? N  Do you have problems with loss of bowel control? N  Managing your Medications? N  Managing your Finances? N  Housekeeping or managing your Housekeeping? N    Patient Care Team: Libby Maw, MD as PCP - General (Family Medicine)  Indicate any recent Medical Services you may have received from other than [facility] providers in the past year (date may be approximate).     Assessment:   This is a routine wellness examination for  Martha Swanson.  Hearing/Vision screen Vision Screening - Comments:: Annual eye exams wear glasses   Dietary issues and exercise activities discussed: Current Exercise Habits: Home exercise routine, Type of exercise: walking, Time (Minutes): 40, Frequency (Times/Week): 5, Weekly Exercise (Minutes/Week): 200, Intensity: Mild, Exercise limited by: None identified   Goals Addressed   None    Depression Screen    12/27/2021   12:49 PM 12/27/2021   12:47 PM 07/14/2021    8:32 AM 12/27/2020   10:00 AM 12/27/2020    8:08 AM 12/13/2020    3:11 PM 06/25/2019    7:52 AM  PHQ 2/9 Scores  PHQ - 2 Score 0 0 0 0 0 0 0  PHQ- 9 Score    1       Fall Risk    12/27/2021   12:48 PM 07/14/2021    8:33 AM 12/27/2020    8:08 AM 12/13/2020    3:04 PM 06/25/2019    7:52 AM  Fall Risk   Falls in the past year? 0 0 0 0 0  Number falls in past yr: 0 0  0   Injury with Fall? 0   0   Follow up Falls evaluation completed;Education provided   Falls evaluation completed;Falls prevention discussed     FALL RISK PREVENTION PERTAINING TO THE HOME:  Any stairs in or around the home? No  If so, are there any without handrails? No  Home free of loose throw rugs in walkways, pet beds, electrical cords, etc? Yes  Adequate lighting  in your home to reduce risk of falls? Yes   ASSISTIVE DEVICES UTILIZED TO PREVENT FALLS:  Life alert? No  Use of a cane, walker or w/c? No  Grab bars in the bathroom? No  Shower chair or bench in shower? No  Elevated toilet seat or a handicapped toilet? No     Cognitive Function:    Normal cognitive status assessed by telephone conversation  by this Nurse Health Advisor. No abnormalities found.      Immunizations Immunization History  Administered Date(s) Administered   Influenza,inj,Quad PF,6+ Mos 03/16/2017, 03/08/2018, 03/28/2019, 02/28/2021   Influenza-Unspecified 03/18/2013, 03/05/2020   PFIZER(Purple Top)SARS-COV-2 Vaccination 06/30/2019, 07/20/2019, 03/16/2020, 09/17/2020, 03/08/2021   PNEUMOCOCCAL CONJUGATE-20 07/14/2021   Pneumococcal Polysaccharide-23 06/09/2013   Tdap 07/06/2015   Zoster Recombinat (Shingrix) 01/28/2019, 04/03/2019    TDAP status: Up to date  Flu Vaccine status: Up to date  Pneumococcal vaccine status: Up to date  Covid-19 vaccine status: Completed vaccines  Qualifies for Shingles Vaccine? Yes   Zostavax completed Yes   Shingrix Completed?: Yes  Screening Tests Health Maintenance  Topic Date Due   Hepatitis C Screening  Never done   COVID-19 Vaccine (6 - Pfizer series) 05/03/2021   INFLUENZA VACCINE  01/16/2022   TETANUS/TDAP  07/05/2025   Pneumonia Vaccine 35+ Years old  Completed   DEXA SCAN  Completed   Zoster Vaccines- Shingrix  Completed   HPV VACCINES  Aged Out   COLONOSCOPY (Pts 45-11yr Insurance coverage will need to be confirmed)  Discontinued    Health Maintenance  Health Maintenance Due  Topic Date Due   Hepatitis C Screening  Never done   COVID-19 Vaccine ([facility]) 05/03/2021    Colorectal cancer screening: No longer required.   Mammogram status: No longer required due to age.  Bone Density status: Completed 06/29/2021. Results reflect: Bone density results: OSTEOPENIA. Repeat every 5 years.  Lung  Cancer Screening: (Low Dose CT Chest recommended if Age  55-80 years, 30 pack-year currently smoking OR have quit w/in 15years.) does not qualify.   Lung Cancer Screening Referral: n/a  Additional Screening:  Hepatitis C Screening: does not qualify;   Vision Screening: Recommended annual ophthalmology exams for early detection of glaucoma and other disorders of the eye. Is the patient up to date with their annual eye exam?  Yes  Who is the provider or what is the name of the office in which the patient attends annual eye exams? [facility]  If pt is not established with a provider, would they like to be referred to a provider to establish care? No .   Dental Screening: Recommended annual dental exams for proper oral hygiene  Community Resource Referral / Chronic Care Management: CRR required this visit?  No   CCM required this visit?  No      Plan:     I have personally reviewed and noted the following in the patient's chart:   Medical and social history Use of alcohol, tobacco or illicit drugs  Current medications and supplements including opioid prescriptions.  Functional ability and status Nutritional status Physical activity Advanced directives List of other physicians Hospitalizations, surgeries, and ER visits in previous 12 months Vitals Screenings to include cognitive, depression, and falls Referrals and appointments  In addition, I have reviewed and discussed with patient certain preventive protocols, quality metrics, and best practice recommendations. A written personalized care plan for preventive services as well as general preventive health recommendations were provided to patient.     Randel Pigg, LPN   08/16/6008   Nurse Notes: none

## 2021-12-27 NOTE — Patient Instructions (Signed)
Martha Swanson , Thank you for taking time to come for your Medicare Wellness Visit. I appreciate your ongoing commitment to your health goals. Please review the following plan we discussed and let me know if I can assist you in the future.   Screening recommendations/referrals: Colonoscopy: no longer required  Mammogram: no longer required  Bone Density: 06/29/2021 Recommended yearly ophthalmology/optometry visit for glaucoma screening and checkup Recommended yearly dental visit for hygiene and checkup  Vaccinations: Influenza vaccine: completed  Pneumococcal vaccine: completed  Tdap vaccine: 07/06/2015 Shingles vaccine: completed     Advanced directives: yes   Conditions/risks identified: none   Next appointment: none    Preventive Care 7 Years and Older, Female Preventive care refers to lifestyle choices and visits with your health care provider that can promote health and wellness. What does preventive care include? A yearly physical exam. This is also called an annual well check. Dental exams once or twice a year. Routine eye exams. Ask your health care provider how often you should have your eyes checked. Personal lifestyle choices, including: Daily care of your teeth and gums. Regular physical activity. Eating a healthy diet. Avoiding tobacco and drug use. Limiting alcohol use. Practicing safe sex. Taking low-dose aspirin every day. Taking vitamin and mineral supplements as recommended by your health care provider. What happens during an annual well check? The services and screenings done by your health care provider during your annual well check will depend on your age, overall health, lifestyle risk factors, and family history of disease. Counseling  Your health care provider may ask you questions about your: Alcohol use. Tobacco use. Drug use. Emotional well-being. Home and relationship well-being. Sexual activity. Eating habits. History of falls. Memory and  ability to understand (cognition). Work and work Statistician. Reproductive health. Screening  You may have the following tests or measurements: Height, weight, and BMI. Blood pressure. Lipid and cholesterol levels. These may be checked every 5 years, or more frequently if you are over 74 years old. Skin check. Lung cancer screening. You may have this screening every year starting at age 53 if you have a 30-pack-year history of smoking and currently smoke or have quit within the past 15 years. Fecal occult blood test (FOBT) of the stool. You may have this test every year starting at age 2. Flexible sigmoidoscopy or colonoscopy. You may have a sigmoidoscopy every 5 years or a colonoscopy every 10 years starting at age 43. Hepatitis C blood test. Hepatitis B blood test. Sexually transmitted disease (STD) testing. Diabetes screening. This is done by checking your blood sugar (glucose) after you have not eaten for a while (fasting). You may have this done every 1-3 years. Bone density scan. This is done to screen for osteoporosis. You may have this done starting at age 17. Mammogram. This may be done every 1-2 years. Talk to your health care provider about how often you should have regular mammograms. Talk with your health care provider about your test results, treatment options, and if necessary, the need for more tests. Vaccines  Your health care provider may recommend certain vaccines, such as: Influenza vaccine. This is recommended every year. Tetanus, diphtheria, and acellular pertussis (Tdap, Td) vaccine. You may need a Td booster every 10 years. Zoster vaccine. You may need this after age 31. Pneumococcal 13-valent conjugate (PCV13) vaccine. One dose is recommended after age 64. Pneumococcal polysaccharide (PPSV23) vaccine. One dose is recommended after age 69. Talk to your health care provider about which screenings and vaccines  you need and how often you need them. This information is  not intended to replace advice given to you by your health care provider. Make sure you discuss any questions you have with your health care provider. Document Released: 07/01/2015 Document Revised: 02/22/2016 Document Reviewed: 04/05/2015 Elsevier Interactive Patient Education  2017 [employer] Prevention in the Home Falls can cause injuries. They can happen to people of all ages. There are many things you can do to make your home safe and to help prevent falls. What can I do on the outside of my home? Regularly fix the edges of walkways and driveways and fix any cracks. Remove anything that might make you trip as you walk through a door, such as a raised step or threshold. Trim any bushes or trees on the path to your home. Use bright outdoor lighting. Clear any walking paths of anything that might make someone trip, such as rocks or tools. Regularly check to see if handrails are loose or broken. Make sure that both sides of any steps have handrails. Any raised decks and porches should have guardrails on the edges. Have any leaves, snow, or ice cleared regularly. Use sand or salt on walking paths during winter. Clean up any spills in your garage right away. This includes oil or grease spills. What can I do in the bathroom? Use night lights. Install grab bars by the toilet and in the tub and shower. Do not use towel bars as grab bars. Use non-skid mats or decals in the tub or shower. If you need to sit down in the shower, use a plastic, non-slip stool. Keep the floor dry. Clean up any water that spills on the floor as soon as it happens. Remove soap buildup in the tub or shower regularly. Attach bath mats securely with double-sided non-slip rug tape. Do not have throw rugs and other things on the floor that can make you trip. What can I do in the bedroom? Use night lights. Make sure that you have a light by your bed that is easy to reach. Do not use any sheets or blankets that  are too big for your bed. They should not hang down onto the floor. Have a firm chair that has side arms. You can use this for support while you get dressed. Do not have throw rugs and other things on the floor that can make you trip. What can I do in the kitchen? Clean up any spills right away. Avoid walking on wet floors. Keep items that you use a lot in easy-to-reach places. If you need to reach something above you, use a strong step stool that has a grab bar. Keep electrical cords out of the way. Do not use floor polish or wax that makes floors slippery. If you must use wax, use non-skid floor wax. Do not have throw rugs and other things on the floor that can make you trip. What can I do with my stairs? Do not leave any items on the stairs. Make sure that there are handrails on both sides of the stairs and use them. Fix handrails that are broken or loose. Make sure that handrails are as long as the stairways. Check any carpeting to make sure that it is firmly attached to the stairs. Fix any carpet that is loose or worn. Avoid having throw rugs at the top or bottom of the stairs. If you do have throw rugs, attach them to the floor with carpet tape. Make sure  that you have a light switch at the top of the stairs and the bottom of the stairs. If you do not have them, ask someone to add them for you. What else can I do to help prevent falls? Wear shoes that: Do not have high heels. Have rubber bottoms. Are comfortable and fit you well. Are closed at the toe. Do not wear sandals. If you use a stepladder: Make sure that it is fully opened. Do not climb a closed stepladder. Make sure that both sides of the stepladder are locked into place. Ask someone to hold it for you, if possible. Clearly mark and make sure that you can see: Any grab bars or handrails. First and last steps. Where the edge of each step is. Use tools that help you move around (mobility aids) if they are needed. These  include: Canes. Walkers. Scooters. Crutches. Turn on the lights when you go into a dark area. Replace any light bulbs as soon as they burn out. Set up your furniture so you have a clear path. Avoid moving your furniture around. If any of your floors are uneven, fix them. If there are any pets around you, be aware of where they are. Review your medicines with your doctor. Some medicines can make you feel dizzy. This can increase your chance of falling. Ask your doctor what other things that you can do to help prevent falls. This information is not intended to replace advice given to you by your health care provider. Make sure you discuss any questions you have with your health care provider. Document Released: 03/31/2009 Document Revised: 11/10/2015 Document Reviewed: 07/09/2014 Elsevier Interactive Patient Education  2017 [employer].

## 2021-12-29 NOTE — Telephone Encounter (Signed)
error 

## 2022-01-11 ENCOUNTER — Ambulatory Visit: Payer: Medicare Other | Admitting: Family Medicine

## 2022-01-18 DIAGNOSIS — H2513 Age-related nuclear cataract, bilateral: Secondary | ICD-10-CM | POA: Diagnosis not present

## 2022-01-22 ENCOUNTER — Encounter: Payer: Self-pay | Admitting: Family Medicine

## 2022-01-22 ENCOUNTER — Ambulatory Visit (INDEPENDENT_AMBULATORY_CARE_PROVIDER_SITE_OTHER): Payer: Medicare Other | Admitting: Family Medicine

## 2022-01-22 VITALS — BP 148/74 | HR 83 | Temp 98.1°F | Ht <= 58 in | Wt 95.2 lb

## 2022-01-22 DIAGNOSIS — E7800 Pure hypercholesterolemia, unspecified: Secondary | ICD-10-CM | POA: Diagnosis not present

## 2022-01-22 DIAGNOSIS — E039 Hypothyroidism, unspecified: Secondary | ICD-10-CM

## 2022-01-22 DIAGNOSIS — R030 Elevated blood-pressure reading, without diagnosis of hypertension: Secondary | ICD-10-CM | POA: Diagnosis not present

## 2022-01-22 LAB — TSH: TSH: 1.24 u[IU]/mL (ref 0.35–5.50)

## 2022-01-22 LAB — URINALYSIS, ROUTINE W REFLEX MICROSCOPIC
Bilirubin Urine: NEGATIVE
Hgb urine dipstick: NEGATIVE
Ketones, ur: NEGATIVE
Leukocytes,Ua: NEGATIVE
Nitrite: NEGATIVE
RBC / HPF: NONE SEEN (ref 0–?)
Specific Gravity, Urine: <=1.005 — AB (ref 1.000–1.030)
Total Protein, Urine: NEGATIVE
Urine Glucose: NEGATIVE
Urobilinogen, UA: 0.2 (ref 0.0–1.0)
WBC, UA: NONE SEEN (ref 0–?)
pH: 6 (ref 5.0–8.0)

## 2022-01-22 LAB — LIPID PANEL
Cholesterol: 211 mg/dL — ABNORMAL HIGH (ref 0–200)
HDL: 77.9 mg/dL (ref >39.00)
LDL Cholesterol: 116 mg/dL — ABNORMAL HIGH (ref 0–99)
NonHDL: 133.11
Total CHOL/HDL Ratio: 3
Triglycerides: 86 mg/dL (ref 0.0–149.0)
VLDL: 17.2 mg/dL (ref 0.0–40.0)

## 2022-01-22 LAB — COMPREHENSIVE METABOLIC PANEL
ALT: 17 U/L (ref 0–35)
AST: 18 U/L (ref 0–37)
Albumin: 4.6 g/dL (ref 3.5–5.2)
Alkaline Phosphatase: 50 U/L (ref 39–117)
BUN: 10 mg/dL (ref 6–23)
CO2: 24 mEq/L (ref 19–32)
Calcium: 9.4 mg/dL (ref 8.4–10.5)
Chloride: 105 mEq/L (ref 96–112)
Creatinine, Ser: 0.86 mg/dL (ref 0.40–1.20)
GFR: 65.68 mL/min (ref >60.00)
Glucose, Bld: 83 mg/dL (ref 70–99)
Potassium: 3.6 mEq/L (ref 3.5–5.1)
Sodium: 135 mEq/L (ref 135–145)
Total Bilirubin: 0.7 mg/dL (ref 0.2–1.2)
Total Protein: 7.1 g/dL (ref 6.0–8.3)

## 2022-01-22 LAB — CBC
HCT: 40.7 % (ref 36.0–46.0)
Hemoglobin: 14 g/dL (ref 12.0–15.0)
MCHC: 34.4 g/dL (ref 30.0–36.0)
MCV: 89.7 fl (ref 78.0–100.0)
Platelets: 266 10*3/uL (ref 150.0–400.0)
RBC: 4.54 Mil/uL (ref 3.87–5.11)
RDW: 13.4 % (ref 11.5–15.5)
WBC: 4.8 10*3/uL (ref 4.0–10.5)

## 2022-01-22 NOTE — Progress Notes (Signed)
Established Patient Office Visit  Subjective   Patient ID: Martha Swanson, female    DOB: 01/03/1946  Age: 76 y.o. MRN: [id number]  Chief Complaint  Patient presents with   Follow-up    6 month follow up, no concerns. Patient fasting.     HPI follow-up of hypothyroidism and elevated cholesterol.  She has no history of hypertension.  Denies headaches or blurred vision other than cataract may need excision.  Continues with levothyroxine daily.  Continues exercising daily by walking.  Was erratic cookout last night with a lot of salty foods.    Review of Systems  Constitutional:  Negative for chills, diaphoresis, malaise/fatigue and weight loss.  HENT: Negative.    Eyes: Negative.  Negative for blurred vision and double vision.  Cardiovascular:  Negative for chest pain.  Gastrointestinal:  Negative for abdominal pain.  Genitourinary: Negative.   Musculoskeletal:  Negative for falls and myalgias.  Neurological:  Negative for speech change, loss of consciousness and weakness.  Psychiatric/Behavioral: Negative.        Objective:     BP (!) 148/74 (BP Location: Right Arm, Patient Position: Sitting, Cuff Size: Normal)   Pulse 83   Temp 98.1 F (36.7 C) (Temporal)   Ht '4\' 10"'$  (1.473 m)   Wt 95 lb 3.2 oz (43.2 kg)   SpO2 98%   BMI 19.90 kg/m  Wt Readings from Last 3 Encounters:  01/22/22 95 lb 3.2 oz (43.2 kg)  07/14/21 89 lb (40.4 kg)  12/27/20 88 lb 6.4 oz (40.1 kg)      Physical Exam Constitutional:      General: She is not in acute distress.    Appearance: Normal appearance. She is not ill-appearing, toxic-appearing or diaphoretic.  HENT:     Head: Normocephalic and atraumatic.     Right Ear: External ear normal.     Left Ear: External ear normal.     Mouth/Throat:     Mouth: Mucous membranes are moist.     Pharynx: Oropharynx is clear. No oropharyngeal exudate or posterior oropharyngeal erythema.  Eyes:     General: No scleral icterus.       Right eye: No  discharge.        Left eye: No discharge.     Extraocular Movements: Extraocular movements intact.     Conjunctiva/sclera: Conjunctivae normal.     Pupils: Pupils are equal, round, and reactive to light.  Cardiovascular:     Rate and Rhythm: Normal rate and regular rhythm.  Pulmonary:     Effort: Pulmonary effort is normal. No respiratory distress.     Breath sounds: Normal breath sounds. No rales.  Abdominal:     General: Bowel sounds are normal.     Tenderness: There is no guarding.  Musculoskeletal:     Cervical back: No rigidity or tenderness.     Right lower leg: No edema.     Left lower leg: No edema.  Lymphadenopathy:     Cervical: No cervical adenopathy.  Skin:    General: Skin is warm and dry.  Neurological:     Mental Status: She is alert and oriented to person, place, and time.  Psychiatric:        Mood and Affect: Mood normal.        Behavior: Behavior normal.      No results found for any visits on 01/22/22.    The 10-year ASCVD risk score (Arnett DK, et al., 2019) is: 23.4%    Assessment &  Plan:   Problem List Items Addressed This Visit       Endocrine   Acquired hypothyroidism - Primary   Relevant Orders   CBC   TSH     Other   Elevated LDL cholesterol level   Relevant Orders   Comprehensive metabolic panel   Urinalysis, Routine w reflex microscopic   Lipid panel   Elevated BP without diagnosis of hypertension    Return in about 6 months (around 07/25/2022).  Continue levothyroxine as directed.  Check and record blood pressures periodically.  She has a cuff at home.  Information was given on preventing hypertension.  Continue healthy active lifestyle.  Libby Maw, MD

## 2022-01-31 ENCOUNTER — Ambulatory Visit (INDEPENDENT_AMBULATORY_CARE_PROVIDER_SITE_OTHER): Payer: Medicare Other | Admitting: Family Medicine

## 2022-01-31 ENCOUNTER — Encounter: Payer: Self-pay | Admitting: Family Medicine

## 2022-01-31 VITALS — BP 128/68 | HR 73 | Temp 97.4°F | Ht <= 58 in | Wt 95.0 lb

## 2022-01-31 DIAGNOSIS — I10 Essential (primary) hypertension: Secondary | ICD-10-CM

## 2022-01-31 MED ORDER — LISINOPRIL 10 MG PO TABS: 10.0000 mg | ORAL_TABLET | Freq: Every day | ORAL | 0 refills | Status: DC

## 2022-01-31 NOTE — Progress Notes (Signed)
Established Patient Office Visit  Subjective   Patient ID: Martha Swanson, female    DOB: 1945-12-21  Age: 76 y.o. MRN: [id number]  Chief Complaint  Patient presents with   Hypertension    Concerns about elevated BP readings, little headache that come and go.     Hypertension Associated symptoms include headaches. Pertinent negatives include no blurred vision, chest pain or malaise/fatigue.   follow-up of elevated blood pressure from last visit.  Blood pressures at home have been running in the 150-160/80-90 range.  She has experienced some headaches without blurred vision or dizziness.  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  Denies increased stress.  She does not drink, smoke or use illicit drugs.  2 grown daughters who live in [place] and [facility].  She has 7 grandkids children.  Things are well at home with her and her husband    Review of Systems  Constitutional:  Negative for chills, diaphoresis, malaise/fatigue and weight loss.  HENT: Negative.    Eyes: Negative.  Negative for blurred vision and double vision.  Respiratory:  Negative for cough.   Cardiovascular:  Negative for chest pain.  Gastrointestinal:  Negative for abdominal pain.  Genitourinary: Negative.   Musculoskeletal:  Negative for falls and myalgias.  Neurological:  Positive for headaches. Negative for dizziness, speech change, loss of consciousness and weakness.  Psychiatric/Behavioral: Negative.        Objective:     BP 128/68 (BP Location: Right Arm, Patient Position: Sitting, Cuff Size: Normal)   Pulse 73   Temp (!) 97.4 F (36.3 C) (Temporal)   Ht '4\' 10"'$  (1.473 m)   Wt 95 lb (43.1 kg)   SpO2 99%   BMI 19.86 kg/m  BP Readings from Last 3 Encounters:  01/31/22 128/68  01/22/22 (!) 148/74  07/14/21 136/74      Physical Exam Constitutional:      General: She is not in acute distress.    Appearance: Normal appearance. She is not ill-appearing,  toxic-appearing or diaphoretic.  HENT:     Head: Normocephalic and atraumatic.     Right Ear: External ear normal.     Left Ear: External ear normal.     Mouth/Throat:     Mouth: Mucous membranes are moist.     Pharynx: Oropharynx is clear. No oropharyngeal exudate or posterior oropharyngeal erythema.  Eyes:     General: No scleral icterus.       Right eye: No discharge.        Left eye: No discharge.     Extraocular Movements: Extraocular movements intact.     Conjunctiva/sclera: Conjunctivae normal.     Pupils: Pupils are equal, round, and reactive to light.  Cardiovascular:     Rate and Rhythm: Normal rate and regular rhythm.  Pulmonary:     Effort: Pulmonary effort is normal. No respiratory distress.     Breath sounds: Normal breath sounds.  Abdominal:     General: Bowel sounds are normal.     Tenderness: There is no guarding.  Musculoskeletal:     Cervical back: No rigidity or tenderness.  Skin:    General: Skin is warm and dry.  Neurological:     Mental Status: She is alert and oriented to person, place, and time.  Psychiatric:        Mood and Affect: Mood normal.        Behavior: Behavior normal.  No results found for any visits on 01/31/22.    The 10-year ASCVD risk score (Arnett DK, et al., 2019) is: 23.7%    Assessment & Plan:   Problem List Items Addressed This Visit   None Visit Diagnoses     Essential hypertension    -  Primary   Relevant Medications   lisinopril (ZESTRIL) 10 MG tablet   Other Relevant Orders   Aldosterone + renin activity w/ ratio       Return in about 4 weeks (around 02/28/2022).  Information was given on managing hypertension.  Discussed lisinopril and its side effects.  She will continue checking her blood pressure.  Libby Maw, MD

## 2022-02-06 LAB — ALDOSTERONE + RENIN ACTIVITY W/ RATIO
ALDO / PRA Ratio: 7.8 Ratio (ref 0.9–28.9)
Aldosterone: 5 ng/dL
Renin Activity: 0.64 ng/mL/h (ref 0.25–5.82)

## 2022-02-22 DIAGNOSIS — Z23 Encounter for immunization: Secondary | ICD-10-CM | POA: Diagnosis not present

## 2022-02-28 ENCOUNTER — Encounter: Payer: Self-pay | Admitting: Family Medicine

## 2022-02-28 ENCOUNTER — Ambulatory Visit (INDEPENDENT_AMBULATORY_CARE_PROVIDER_SITE_OTHER): Payer: Medicare Other | Admitting: Family Medicine

## 2022-02-28 VITALS — BP 148/70 | HR 77 | Temp 97.6°F | Ht <= 58 in | Wt 95.2 lb

## 2022-02-28 DIAGNOSIS — E039 Hypothyroidism, unspecified: Secondary | ICD-10-CM

## 2022-02-28 DIAGNOSIS — E7800 Pure hypercholesterolemia, unspecified: Secondary | ICD-10-CM | POA: Diagnosis not present

## 2022-02-28 DIAGNOSIS — I10 Essential (primary) hypertension: Secondary | ICD-10-CM | POA: Insufficient documentation

## 2022-02-28 NOTE — Progress Notes (Signed)
Established Patient Office Visit  Subjective   Patient ID: Martha Swanson, female    DOB: 1945/09/20  Age: 76 y.o. MRN: [id number]  Chief Complaint  Patient presents with   Follow-up    4 week follow up on BP, no concerns.     HPI follow-up of hypertension and elevated LDL cholesterol within the updated ASCVD risk score.  Patient has not decided not to start the atorvastatin as of yet.  Her elevated ASCVD risk score is mostly secondary to age and elevated blood pressure.  Her HDL is 77.  Mom lived to 5.  Dad died at 57 from an MI.  Patient does not smoke or drink alcohol.  Multiple blood pressures taken at home are mostly in the 120-130/60-70 range.    Review of Systems  Constitutional:  Negative for chills, diaphoresis, malaise/fatigue and weight loss.  HENT: Negative.    Eyes: Negative.  Negative for blurred vision and double vision.  Respiratory: Negative.    Cardiovascular:  Negative for chest pain.  Gastrointestinal:  Negative for abdominal pain.  Genitourinary: Negative.   Musculoskeletal:  Negative for falls and myalgias.  Neurological:  Negative for speech change, loss of consciousness, weakness and headaches.  Psychiatric/Behavioral: Negative.        Objective:     BP (!) 148/70 (BP Location: Right Arm, Patient Position: Sitting, Cuff Size: Normal)   Pulse 77   Temp 97.6 F (36.4 C) (Temporal)   Ht '4\' 10"'$  (1.473 m)   Wt 95 lb 3.2 oz (43.2 kg)   SpO2 97%   BMI 19.90 kg/m    Physical Exam Constitutional:      General: She is not in acute distress.    Appearance: Normal appearance. She is not ill-appearing, toxic-appearing or diaphoretic.  HENT:     Head: Normocephalic and atraumatic.     Right Ear: External ear normal.     Left Ear: External ear normal.     Mouth/Throat:     Mouth: Mucous membranes are moist.     Pharynx: Oropharynx is clear. No oropharyngeal exudate or posterior oropharyngeal erythema.  Eyes:     General: No scleral icterus.        Right eye: No discharge.        Left eye: No discharge.     Extraocular Movements: Extraocular movements intact.     Conjunctiva/sclera: Conjunctivae normal.     Pupils: Pupils are equal, round, and reactive to light.  Cardiovascular:     Rate and Rhythm: Normal rate and regular rhythm.     Heart sounds: Murmur heard.  Pulmonary:     Effort: Pulmonary effort is normal. No respiratory distress.     Breath sounds: Normal breath sounds.  Musculoskeletal:     Cervical back: No rigidity or tenderness.  Skin:    General: Skin is warm and dry.  Neurological:     Mental Status: She is alert and oriented to person, place, and time.  Psychiatric:        Mood and Affect: Mood normal.        Behavior: Behavior normal.      No results found for any visits on 02/28/22.    The 10-year ASCVD risk score (Arnett DK, et al., 2019) is: 30.5%    Assessment & Plan:   Problem List Items Addressed This Visit       Cardiovascular and Mediastinum   White coat syndrome with diagnosis of hypertension     Endocrine  Acquired hypothyroidism     Other   Elevated LDL cholesterol level - Primary    Return in about 3 months (around 05/30/2022).  Continue periodic blood pressure checks and lisinopril at 10 mg.  Had a long discussion about the elevated ASCVD risk score and advised that atorvastatin could mitigate this risk.  Advised that it is up to her about whether or not she take the atorvastatin.  We will look out for any unusual muscle aches if she decides to do so.  Libby Maw, MD

## 2022-03-27 DIAGNOSIS — Z23 Encounter for immunization: Secondary | ICD-10-CM | POA: Diagnosis not present

## 2022-04-22 ENCOUNTER — Other Ambulatory Visit: Payer: Self-pay | Admitting: Family Medicine

## 2022-04-22 DIAGNOSIS — I10 Essential (primary) hypertension: Secondary | ICD-10-CM

## 2022-05-25 IMAGING — MG MM DIGITAL SCREENING BILAT W/ TOMO AND CAD
8 series · 9 of 24 positions shown · non-contrast
Comparison: Previous exam(s).

CLINICAL DATA: Screening.

EXAM:
DIGITAL SCREENING BILATERAL MAMMOGRAM WITH TOMOSYNTHESIS AND CAD
TECHNIQUE: Bilateral screening digital craniocaudal and mediolateral oblique
mammograms were obtained. Bilateral screening digital breast
tomosynthesis was performed. The images were evaluated with
computer-aided detection.

[L MLO synth-2D]
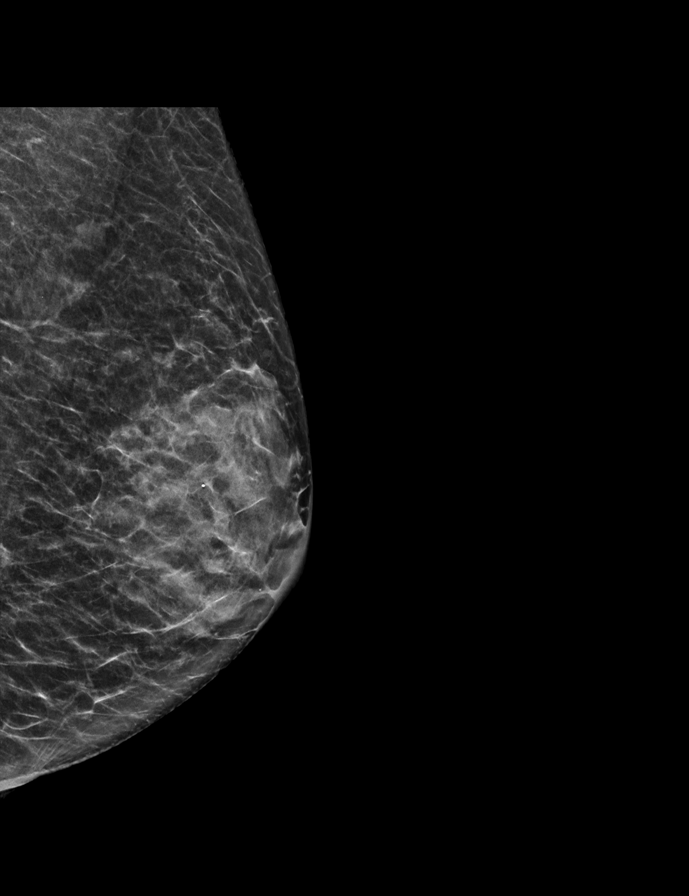

[R CC synth-2D]
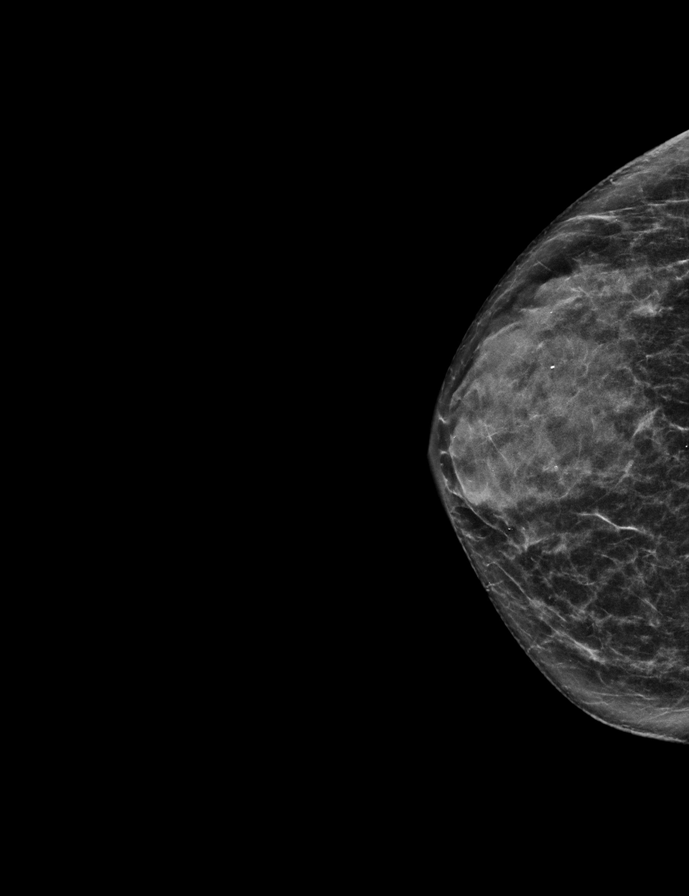

[R MLO synth-2D]
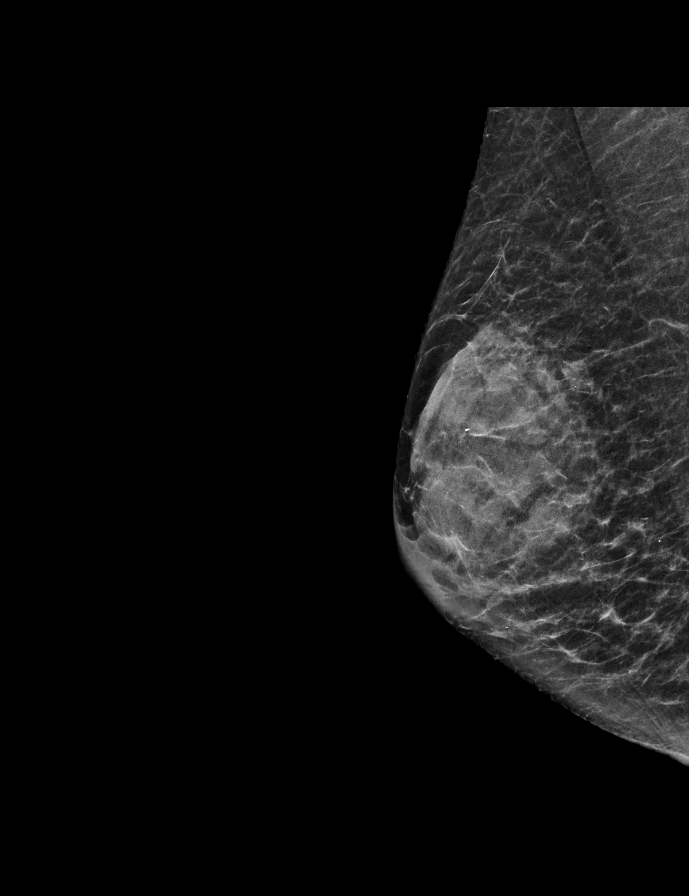

[L CC synth-2D]
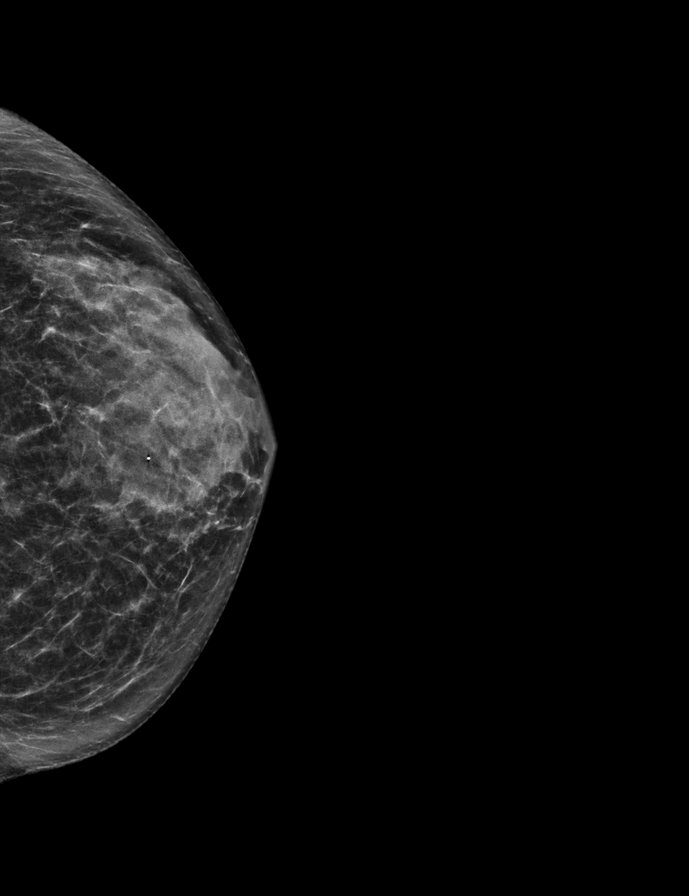

[L MLO tomo · 2 of 55 frames shown]
[frame 18/55]
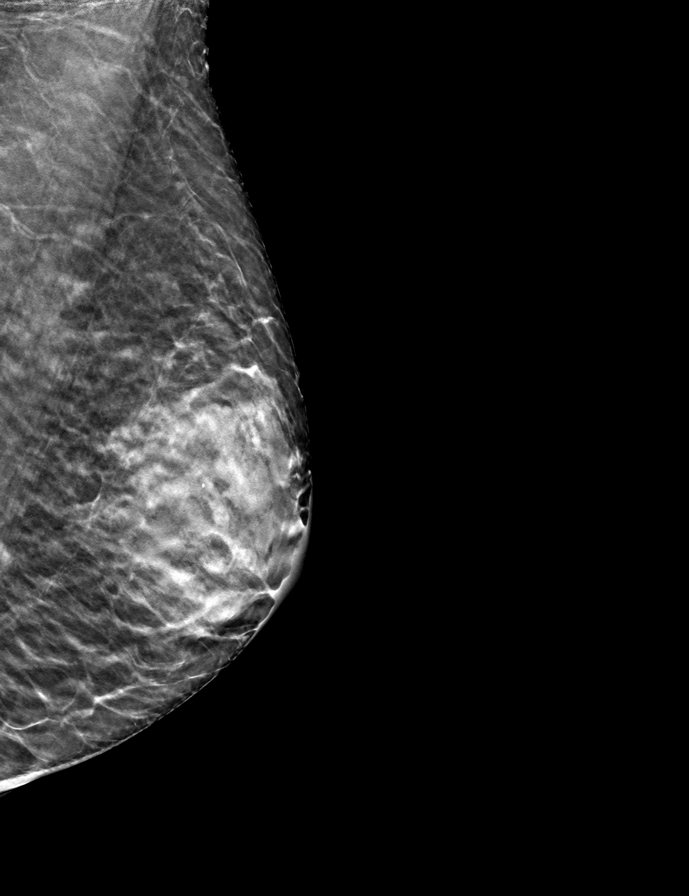
[frame 28/55]
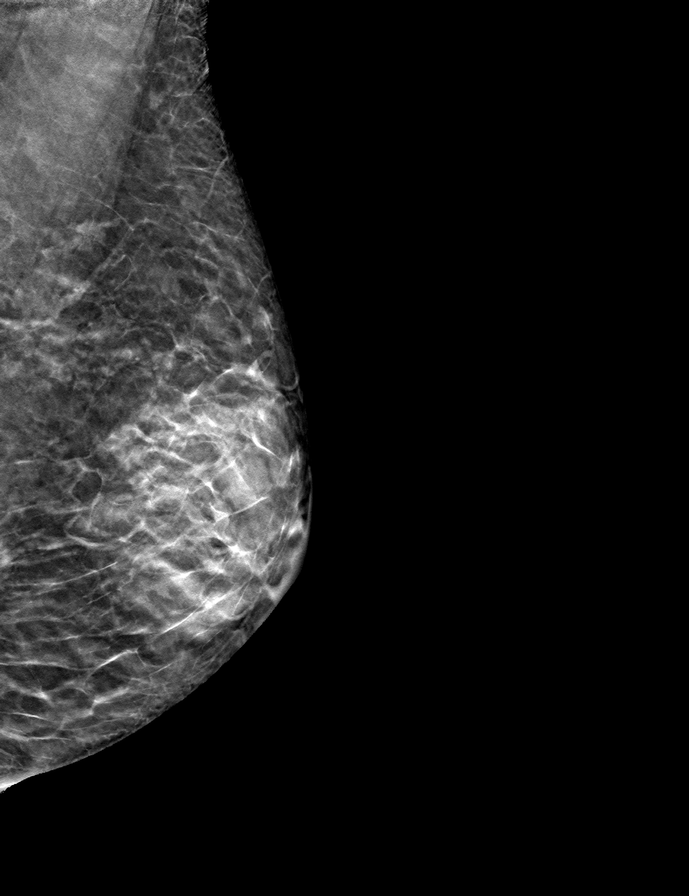

[R MLO tomo · tomo slice 26/51.0]
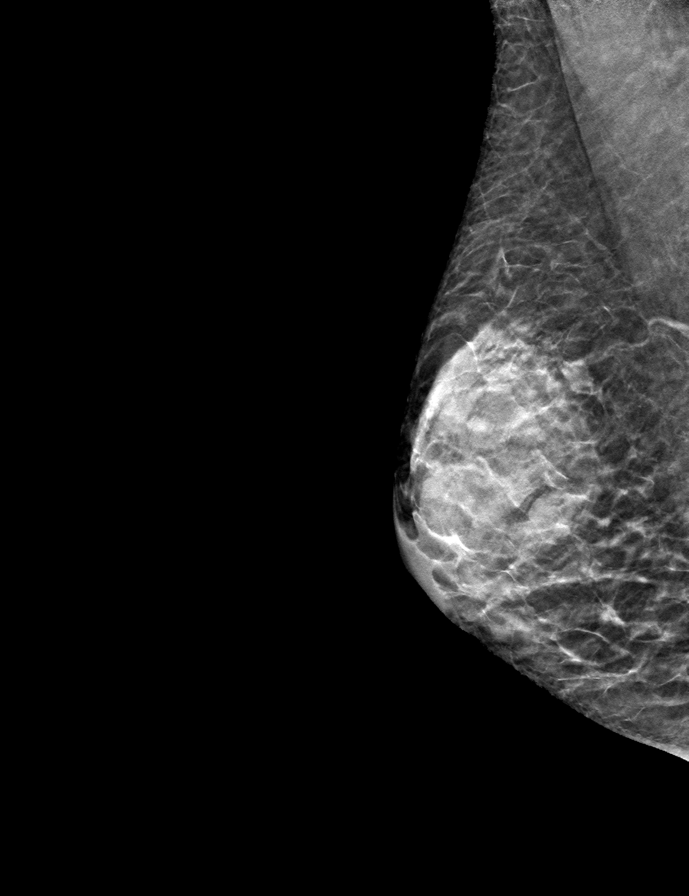

[R CC tomo · tomo slice 27/52.0]
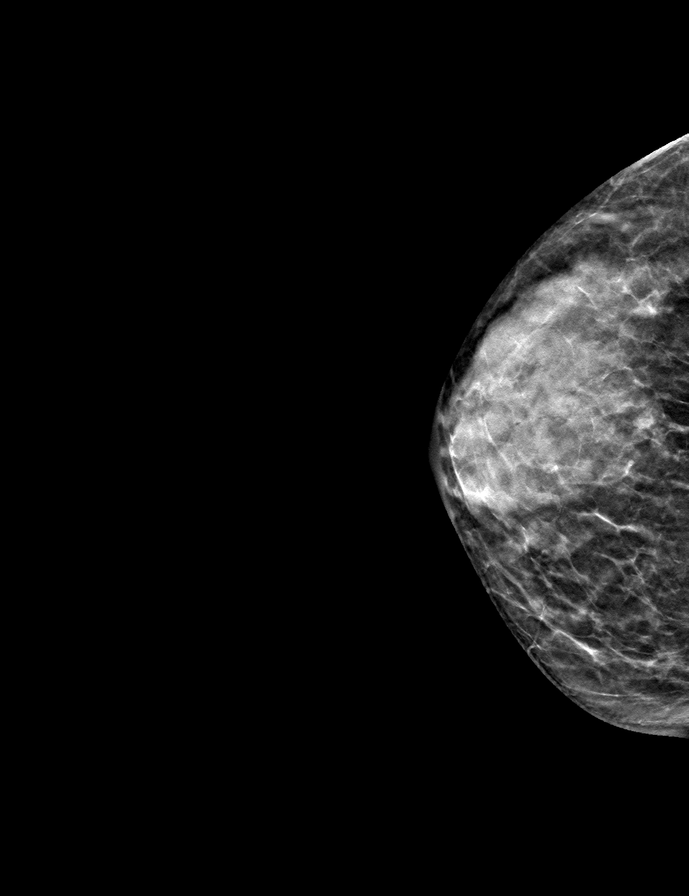

[L CC tomo · tomo slice 27/54.0]
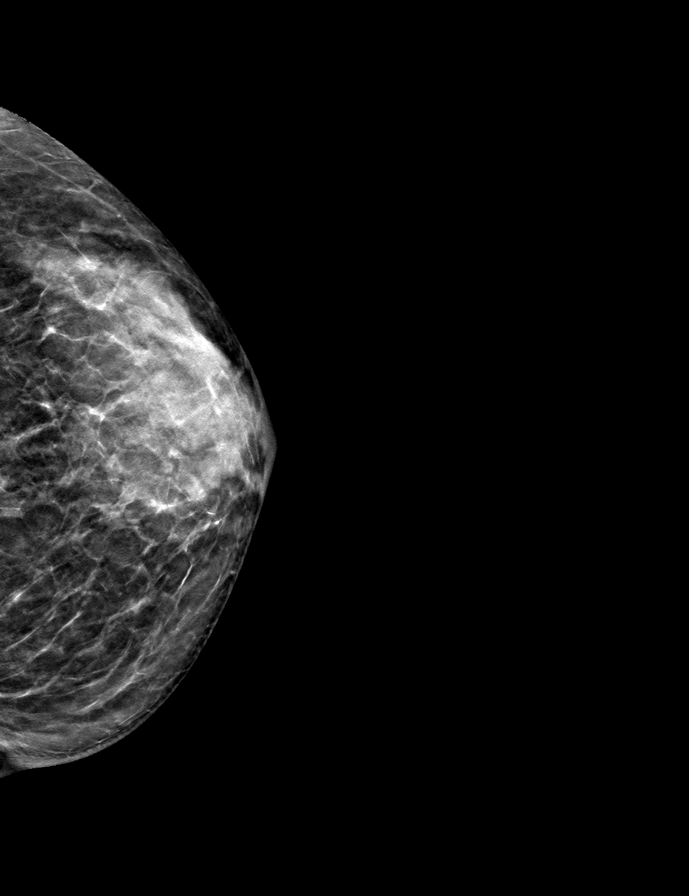

[9 of 24 positions shown; findings below may reference images not displayed]

ACR Breast Density Category c: The breast tissue is heterogeneously
dense, which may obscure small masses.
FINDINGS: There are no findings suspicious for malignancy.
IMPRESSION: No mammographic evidence of malignancy. A result letter of this
screening mammogram will be mailed directly to the patient.

RECOMMENDATION:
Screening mammogram in one year. (Code:Q3-W-BC3)

BI-RADS CATEGORY  1: Negative.

## 2022-05-31 ENCOUNTER — Ambulatory Visit: Payer: Medicare Other | Admitting: Family Medicine

## 2022-06-25 ENCOUNTER — Encounter: Payer: Self-pay | Admitting: Family Medicine

## 2022-06-25 ENCOUNTER — Ambulatory Visit (INDEPENDENT_AMBULATORY_CARE_PROVIDER_SITE_OTHER): Payer: Medicare Other | Admitting: Family Medicine

## 2022-06-25 VITALS — BP 138/78 | HR 75 | Temp 97.3°F | Ht <= 58 in | Wt 93.2 lb

## 2022-06-25 DIAGNOSIS — E7800 Pure hypercholesterolemia, unspecified: Secondary | ICD-10-CM | POA: Diagnosis not present

## 2022-06-25 DIAGNOSIS — I10 Essential (primary) hypertension: Secondary | ICD-10-CM

## 2022-06-25 DIAGNOSIS — E039 Hypothyroidism, unspecified: Secondary | ICD-10-CM

## 2022-06-25 MED ORDER — LISINOPRIL 10 MG PO TABS: 10.0000 mg | ORAL_TABLET | Freq: Every day | ORAL | 2 refills | Status: DC

## 2022-06-25 MED ORDER — LEVOTHYROXINE SODIUM 50 MCG PO TABS: ORAL_TABLET | ORAL | 2 refills | Status: DC

## 2022-06-25 NOTE — Progress Notes (Signed)
Established Patient Office Visit   Subjective:  Patient ID: Martha Swanson, female    DOB: Jul 16, 1945  Age: 77 y.o. MRN: [id number]  Chief Complaint  Patient presents with   Follow-up    3 month follow up, no concerns. Patient fasting.     HPI Encounter Diagnoses  Name Primary?   White coat syndrome with diagnosis of hypertension Yes   Acquired hypothyroidism    Elevated LDL cholesterol level    Follow-up of elevated ldl cholesterol, hypertension with a whitecoat component, hypothyroidism.  Continues thyroxine 50 mcg for well-controlled hypothyroidism.  Blood pressure at home is running in the 130s over 70s to 80s on 10 mg of lisinopril.  She has decided not to take the atorvastatin.   Review of Systems  Constitutional: Negative.   HENT: Negative.    Eyes:  Negative for blurred vision, discharge and redness.  Respiratory: Negative.    Cardiovascular: Negative.   Gastrointestinal:  Negative for abdominal pain.  Genitourinary: Negative.   Musculoskeletal: Negative.  Negative for myalgias.  Skin:  Negative for rash.  Neurological:  Negative for tingling, loss of consciousness and weakness.  Endo/Heme/Allergies:  Negative for polydipsia.      06/25/2022    9:32 AM 02/28/2022   10:18 AM 01/31/2022   10:20 AM  Depression screen PHQ 2/9  Decreased Interest 0 0 0  Down, Depressed, Hopeless 0 0 0  PHQ - 2 Score 0 0 0      Current Outpatient Medications:    alendronate (FOSAMAX) 70 MG tablet, Take 1 tablet by mouth once a week, Disp: 12 tablet, Rfl: 0   calcium carbonate (OS-CAL) 600 MG TABS tablet, Take 1 tablet (600 mg total) by mouth 2 (two) times daily with a meal., Disp: 60 tablet, Rfl: 12   levothyroxine (SYNTHROID) 50 MCG tablet, TAKE 1 TABLET BY MOUTH ONCE DAILY BEFORE BREAKFAST . REFILLS, Disp: 90 tablet, Rfl: 2   lisinopril (ZESTRIL) 10 MG tablet, Take 1 tablet (10 mg total) by mouth daily., Disp: 90 tablet, Rfl: 2   Objective:     BP 138/78 (BP Location: Right  Arm, Patient Position: Sitting, Cuff Size: Normal)   Pulse 75   Temp (!) 97.3 F (36.3 C) (Temporal)   Ht '4\' 10"'$  (1.473 m)   Wt 93 lb 3.2 oz (42.3 kg)   SpO2 98%   BMI 19.48 kg/m  BP Readings from Last 3 Encounters:  06/25/22 138/78  02/28/22 (!) 148/70  01/31/22 128/68   Wt Readings from Last 3 Encounters:  06/25/22 93 lb 3.2 oz (42.3 kg)  02/28/22 95 lb 3.2 oz (43.2 kg)  01/31/22 95 lb (43.1 kg)      Physical Exam Constitutional:      General: She is not in acute distress.    Appearance: Normal appearance. She is not ill-appearing, toxic-appearing or diaphoretic.  HENT:     Head: Normocephalic and atraumatic.     Right Ear: External ear normal.     Left Ear: External ear normal.     Mouth/Throat:     Mouth: Mucous membranes are moist.     Pharynx: Oropharynx is clear. No oropharyngeal exudate or posterior oropharyngeal erythema.  Eyes:     General: No scleral icterus.       Right eye: No discharge.        Left eye: No discharge.     Extraocular Movements: Extraocular movements intact.     Conjunctiva/sclera: Conjunctivae normal.     Pupils: Pupils are  equal, round, and reactive to light.  Cardiovascular:     Rate and Rhythm: Normal rate and regular rhythm.  Pulmonary:     Effort: Pulmonary effort is normal. No respiratory distress.     Breath sounds: Normal breath sounds.  Musculoskeletal:     Cervical back: No rigidity or tenderness.  Skin:    General: Skin is warm and dry.  Neurological:     Mental Status: She is alert and oriented to person, place, and time.  Psychiatric:        Mood and Affect: Mood normal.        Behavior: Behavior normal.      No results found for any visits on 06/25/22.    The 10-year ASCVD risk score (Arnett DK, et al., 2019) is: 27.1%    Assessment & Plan:   White coat syndrome with diagnosis of hypertension -     Lisinopril; Take 1 tablet (10 mg total) by mouth daily.  Dispense: 90 tablet; Refill: 2  Acquired  hypothyroidism -     Levothyroxine Sodium; TAKE 1 TABLET BY MOUTH ONCE DAILY BEFORE BREAKFAST . REFILLS  Dispense: 90 tablet; Refill: 2  Elevated LDL cholesterol level    Return in about 6 months (around 12/24/2022), or if symptoms worsen or fail to improve.  Patient has decided not to take the statin.  Again only discussed her elevated risk score and that a cholesterol-lowering medicine such as a statin could help mitigate her risk.  Continue lisinopril 10 mg.  She does have a component of whitecoat syndrome.  Continue current dose of levothyroxine.  Follow-up in 6 months.    Libby Maw, MD

## 2022-09-19 ENCOUNTER — Other Ambulatory Visit: Payer: Self-pay | Admitting: Family Medicine

## 2022-09-19 DIAGNOSIS — Z1231 Encounter for screening mammogram for malignant neoplasm of breast: Secondary | ICD-10-CM

## 2022-10-31 ENCOUNTER — Ambulatory Visit
Admission: RE | Admit: 2022-10-31 | Discharge: 2022-10-31 | Disposition: A | Discharge status: Home / Self Care | Payer: Medicare Other | Source: Ambulatory Visit | Attending: Family Medicine | Admitting: Family Medicine

## 2022-10-31 DIAGNOSIS — Z1231 Encounter for screening mammogram for malignant neoplasm of breast: Secondary | ICD-10-CM

## 2022-12-24 ENCOUNTER — Ambulatory Visit: Payer: Medicare Other | Admitting: Family Medicine

## 2023-01-07 ENCOUNTER — Ambulatory Visit (INDEPENDENT_AMBULATORY_CARE_PROVIDER_SITE_OTHER): Payer: Medicare Other

## 2023-01-07 VITALS — Ht 59.0 in | Wt 95.0 lb

## 2023-01-07 DIAGNOSIS — Z0000 Encounter for general adult medical examination without abnormal findings: Secondary | ICD-10-CM

## 2023-01-07 NOTE — Patient Instructions (Signed)
Martha Swanson , Thank you for taking time to come for your Medicare Wellness Visit. I appreciate your ongoing commitment to your health goals. Please review the following plan we discussed and let me know if I can assist you in the future.   These are the goals we discussed:  Goals      Increase physical activity     Patient Stated     01/07/2023, wants to eat healthier        This is a list of the screening recommended for you and due dates:  Health Maintenance  Topic Date Due   Hepatitis C Screening  Never done   Flu Shot  01/17/2023   Medicare Annual Wellness Visit  01/07/2024   DTaP/Tdap/Td vaccine (2 - Td or Tdap) 07/05/2025   Pneumonia Vaccine  Completed   DEXA scan (bone density measurement)  Completed   Zoster (Shingles) Vaccine  Completed   HPV Vaccine  Aged Out   Colon Cancer Screening  Discontinued   COVID-19 Vaccine  Discontinued    Advanced directives: Please bring a copy of your POA (Power of [place]) and/or Living Will to your next appointment.   Conditions/risks identified: none  Next appointment: Follow up in one year for your annual wellness visit    Preventive Care 65 Years and Older, Female Preventive care refers to lifestyle choices and visits with your health care provider that can promote health and wellness. What does preventive care include? A yearly physical exam. This is also called an annual well check. Dental exams once or twice a year. Routine eye exams. Ask your health care provider how often you should have your eyes checked. Personal lifestyle choices, including: Daily care of your teeth and gums. Regular physical activity. Eating a healthy diet. Avoiding tobacco and drug use. Limiting alcohol use. Practicing safe sex. Taking low-dose aspirin every day. Taking vitamin and mineral supplements as recommended by your health care provider. What happens during an annual well check? The services and screenings done by your health care  provider during your annual well check will depend on your age, overall health, lifestyle risk factors, and family history of disease. Counseling  Your health care provider may ask you questions about your: Alcohol use. Tobacco use. Drug use. Emotional well-being. Home and relationship well-being. Sexual activity. Eating habits. History of falls. Memory and ability to understand (cognition). Work and work Astronomer. Reproductive health. Screening  You may have the following tests or measurements: Height, weight, and BMI. Blood pressure. Lipid and cholesterol levels. These may be checked every 5 years, or more frequently if you are over 100 years old. Skin check. Lung cancer screening. You may have this screening every year starting at age 40 if you have a 30-pack-year history of smoking and currently smoke or have quit within the past 15 years. Fecal occult blood test (FOBT) of the stool. You may have this test every year starting at age 92. Flexible sigmoidoscopy or colonoscopy. You may have a sigmoidoscopy every 5 years or a colonoscopy every 10 years starting at age 8. Hepatitis C blood test. Hepatitis B blood test. Sexually transmitted disease (STD) testing. Diabetes screening. This is done by checking your blood sugar (glucose) after you have not eaten for a while (fasting). You may have this done every 1-3 years. Bone density scan. This is done to screen for osteoporosis. You may have this done starting at age 11. Mammogram. This may be done every 1-2 years. Talk to your health care provider  about how often you should have regular mammograms. Talk with your health care provider about your test results, treatment options, and if necessary, the need for more tests. Vaccines  Your health care provider may recommend certain vaccines, such as: Influenza vaccine. This is recommended every year. Tetanus, diphtheria, and acellular pertussis (Tdap, Td) vaccine. You may need a Td  booster every 10 years. Zoster vaccine. You may need this after age 53. Pneumococcal 13-valent conjugate (PCV13) vaccine. One dose is recommended after age 75. Pneumococcal polysaccharide (PPSV23) vaccine. One dose is recommended after age 2. Talk to your health care provider about which screenings and vaccines you need and how often you need them. This information is not intended to replace advice given to you by your health care provider. Make sure you discuss any questions you have with your health care provider. Document Released: 07/01/2015 Document Revised: 02/22/2016 Document Reviewed: 04/05/2015 Elsevier Interactive Patient Education  2017 [employer].  Fall Prevention in the Home Falls can cause injuries. They can happen to people of all ages. There are many things you can do to make your home safe and to help prevent falls. What can I do on the outside of my home? Regularly fix the edges of walkways and driveways and fix any cracks. Remove anything that might make you trip as you walk through a door, such as a raised step or threshold. Trim any bushes or trees on the path to your home. Use bright outdoor lighting. Clear any walking paths of anything that might make someone trip, such as rocks or tools. Regularly check to see if handrails are loose or broken. Make sure that both sides of any steps have handrails. Any raised decks and porches should have guardrails on the edges. Have any leaves, snow, or ice cleared regularly. Use sand or salt on walking paths during winter. Clean up any spills in your garage right away. This includes oil or grease spills. What can I do in the bathroom? Use night lights. Install grab bars by the toilet and in the tub and shower. Do not use towel bars as grab bars. Use non-skid mats or decals in the tub or shower. If you need to sit down in the shower, use a plastic, non-slip stool. Keep the floor dry. Clean up any water that spills on the floor  as soon as it happens. Remove soap buildup in the tub or shower regularly. Attach bath mats securely with double-sided non-slip rug tape. Do not have throw rugs and other things on the floor that can make you trip. What can I do in the bedroom? Use night lights. Make sure that you have a light by your bed that is easy to reach. Do not use any sheets or blankets that are too big for your bed. They should not hang down onto the floor. Have a firm chair that has side arms. You can use this for support while you get dressed. Do not have throw rugs and other things on the floor that can make you trip. What can I do in the kitchen? Clean up any spills right away. Avoid walking on wet floors. Keep items that you use a lot in easy-to-reach places. If you need to reach something above you, use a strong step stool that has a grab bar. Keep electrical cords out of the way. Do not use floor polish or wax that makes floors slippery. If you must use wax, use non-skid floor wax. Do not have throw rugs and  other things on the floor that can make you trip. What can I do with my stairs? Do not leave any items on the stairs. Make sure that there are handrails on both sides of the stairs and use them. Fix handrails that are broken or loose. Make sure that handrails are as long as the stairways. Check any carpeting to make sure that it is firmly attached to the stairs. Fix any carpet that is loose or worn. Avoid having throw rugs at the top or bottom of the stairs. If you do have throw rugs, attach them to the floor with carpet tape. Make sure that you have a light switch at the top of the stairs and the bottom of the stairs. If you do not have them, ask someone to add them for you. What else can I do to help prevent falls? Wear shoes that: Do not have high heels. Have rubber bottoms. Are comfortable and fit you well. Are closed at the toe. Do not wear sandals. If you use a stepladder: Make sure that it is  fully opened. Do not climb a closed stepladder. Make sure that both sides of the stepladder are locked into place. Ask someone to hold it for you, if possible. Clearly mark and make sure that you can see: Any grab bars or handrails. First and last steps. Where the edge of each step is. Use tools that help you move around (mobility aids) if they are needed. These include: Canes. Walkers. Scooters. Crutches. Turn on the lights when you go into a dark area. Replace any light bulbs as soon as they burn out. Set up your furniture so you have a clear path. Avoid moving your furniture around. If any of your floors are uneven, fix them. If there are any pets around you, be aware of where they are. Review your medicines with your doctor. Some medicines can make you feel dizzy. This can increase your chance of falling. Ask your doctor what other things that you can do to help prevent falls. This information is not intended to replace advice given to you by your health care provider. Make sure you discuss any questions you have with your health care provider. Document Released: 03/31/2009 Document Revised: 11/10/2015 Document Reviewed: 07/09/2014 Elsevier Interactive Patient Education  2017 [employer].

## 2023-01-07 NOTE — Progress Notes (Signed)
Subjective:   Martha Swanson is a 77 y.o. female who presents for Medicare Annual (Subsequent) preventive examination.  Visit Complete: Virtual  I connected with  Maceo Pro on 01/07/23 by a audio enabled telemedicine application and verified that I am speaking with the correct person using two identifiers.  Patient Location: Home  Provider Location: Office/Clinic  I discussed the limitations of evaluation and management by telemedicine. The patient expressed understanding and agreed to proceed.  Patient Medicare AWV questionnaire was completed by the patient on 01/07/2023; I have confirmed that all information answered by patient is correct and no changes since this date.   Per patient no change in vitals since last visit, unable to obtain new vitals due to telehealth visit  Review of Systems     Cardiac Risk Factors include: advanced age (>41men, >81 women)     Objective:    Today's Vitals   01/07/23 1255  Weight: 95 lb (43.1 kg)  Height: 4\' 11"  (1.499 m)   Body mass index is 19.19 kg/m.     01/07/2023   12:59 PM 12/27/2021   12:48 PM 12/13/2020    3:03 PM 04/01/2013    7:33 AM  Advanced Directives  Does Patient Have a Medical Advance Directive? Yes Yes Yes Patient has advance directive, copy not in chart  Type of Advance Directive Healthcare Power of [place];Living will Healthcare Power of [place];Living will Healthcare Power of [place];Living will Living will  Copy of Healthcare Power of Attorney in Chart? No - copy requested No - copy requested No - copy requested   Would patient like information on creating a medical advance directive?   No - Patient declined   Pre-existing out of facility DNR order (yellow form or pink MOST form)    No    Current Medications (verified) Outpatient Encounter Medications as of 01/07/2023  Medication Sig   alendronate (FOSAMAX) 70 MG tablet Take 1 tablet by mouth once a week   calcium carbonate (OS-CAL) 600 MG TABS  tablet Take 1 tablet (600 mg total) by mouth 2 (two) times daily with a meal.   levothyroxine (SYNTHROID) 50 MCG tablet TAKE 1 TABLET BY MOUTH ONCE DAILY BEFORE BREAKFAST . REFILLS   lisinopril (ZESTRIL) 10 MG tablet Take 1 tablet (10 mg total) by mouth daily.   No facility-administered encounter medications on file as of 01/07/2023.    Allergies (verified) Penicillins   History: Past Medical History:  Diagnosis Date   Heart disease    Takotsubo cardiomyopathy     cardiac catheterization initial ejection fraction 20% no coronary artery disease ejection fraction improved after 5 days    Unspecified hypothyroidism    Past Surgical History:  Procedure Laterality Date   ABDOMINAL HYSTERECTOMY     COLONOSCOPY     COLONOSCOPY N/A 04/01/2013   Procedure: COLONOSCOPY;  Surgeon: [name], MD;  Location: [facility];  Service: Endoscopy;  Laterality: N/A;  830   THYROIDECTOMY  1997   TONSILLECTOMY     Family History  Problem Relation Age of Onset   Arthritis Mother    COPD Mother    Hypertension Mother    Heart attack Father 40   Early death Father    Breast cancer Maternal Aunt 8   Cancer Maternal Grandfather    Arthritis Brother    Asthma Brother    Hypertension Brother    Arthritis Brother    Colon cancer Neg Hx    Social History   Socioeconomic History  Marital status: Married    Spouse name: Not on file   Number of children: Not on file   Years of education: Not on file   Highest education level: Not on file  Occupational History    Comment: Part-time pastor  Tobacco Use   Smoking status: Never   Smokeless tobacco: Never  Vaping Use   Vaping status: Never Used  Substance and Sexual Activity   Alcohol use: Yes    Comment: occasional wine   Drug use: No   Sexual activity: Not on file  Other Topics Concern   Not on file  Social History Narrative   Not on file   Social Determinants of Health   Financial Resource Strain: Low Risk  (01/07/2023)    Overall Financial Resource Strain (CARDIA)    Difficulty of Paying Living Expenses: Not very hard  Food Insecurity: No Food Insecurity (01/07/2023)   Hunger Vital Sign    Worried About Running Out of Food in the Last Year: Never true    Ran Out of Food in the Last Year: Never true  Transportation Needs: No Transportation Needs (01/07/2023)   PRAPARE - Administrator, Civil Service (Medical): No    Lack of Transportation (Non-Medical): No  Physical Activity: Insufficiently Active (01/07/2023)   Exercise Vital Sign    Days of Exercise per Week: 4 days    Minutes of Exercise per Session: 30 min  Stress: No Stress Concern Present (01/07/2023)   [employer] of Occupational Health - Occupational Stress Questionnaire    Feeling of Stress : Only a little  Social Connections: Unknown (01/07/2023)   Social Connection and Isolation Panel [NHANES]    Frequency of Communication with Friends and Family: Twice a week    Frequency of Social Gatherings with Friends and Family: Once a week    Attends Religious Services: Not on Marketing executive or Organizations: Yes    Attends Engineer, structural: More than 4 times per year    Marital Status: Married    Tobacco Counseling Counseling given: Not Answered   Clinical Intake:  Pre-visit preparation completed: Yes  Pain : No/denies pain     Nutritional Status: BMI of 19-24  Normal Nutritional Risks: None Diabetes: No  How often do you need to have someone help you when you read instructions, pamphlets, or other written materials from your doctor or pharmacy?: 1 - Never  Interpreter Needed?: No  Information entered by :: NAllen LPN   Activities of Daily Living    01/07/2023   11:53 AM  In your present state of health, do you have any difficulty performing the following activities:  Hearing? 0  Vision? 0  Difficulty concentrating or making decisions? 0  Walking or climbing stairs? 0  Dressing or  bathing? 0  Doing errands, shopping? 0  Preparing Food and eating ? N  Using the Toilet? N  In the past six months, have you accidently leaked urine? N  Do you have problems with loss of bowel control? N  Managing your Medications? N  Managing your Finances? N  Housekeeping or managing your Housekeeping? N    Patient Care Team: [name], MD as PCP - General (Family Medicine)  Indicate any recent Medical Services you may have received from other than [facility] providers in the past year (date may be approximate).     Assessment:   This is a routine wellness examination for Vicky.  Hearing/Vision screen Hearing  Screening - Comments:: Denies hearing issues Vision Screening - Comments:: Regular eye exams, [facility]  Dietary issues and exercise activities discussed:     Goals Addressed             This Visit's Progress    Patient Stated       01/07/2023, wants to eat healthier       Depression Screen    01/07/2023    1:00 PM 06/25/2022    9:32 AM 02/28/2022   10:18 AM 01/31/2022   10:20 AM 01/22/2022    8:15 AM 12/27/2021   12:49 PM 12/27/2021   12:47 PM  PHQ 2/9 Scores  PHQ - 2 Score 0 0 0 0 0 0 0  PHQ- 9 Score 0          Fall Risk    01/07/2023   11:53 AM 06/25/2022    9:32 AM 02/28/2022   10:19 AM 01/31/2022   10:20 AM 01/22/2022    8:15 AM  Fall Risk   Falls in the past year? 0 0 0 0 0  Number falls in past yr: 0 0 0 0 0  Injury with Fall? 0 0     Risk for fall due to : Medication side effect No Fall Risks     Follow up Falls prevention discussed;Falls evaluation completed Falls evaluation completed       MEDICARE RISK AT HOME:  Medicare Risk at Home - 01/07/23 1306     Any stairs in or around the home? Yes    If so, are there any without handrails? No    Home free of loose throw rugs in walkways, pet beds, electrical cords, etc? Yes    Adequate lighting in your home to reduce risk of falls? Yes    Life alert? No    Use of a cane, walker or  w/c? No    Grab bars in the bathroom? No    Shower chair or bench in shower? No    Elevated toilet seat or a handicapped toilet? No             TIMED UP AND GO:  Was the test performed?  No    Cognitive Function:        01/07/2023    1:00 PM  6CIT Screen  What Year? 0 points  What month? 0 points  What time? 0 points  Count back from 20 0 points  Months in reverse 0 points  Repeat phrase 2 points  Total Score 2 points    Immunizations Immunization History  Administered Date(s) Administered   Influenza Whole 02/22/2022   Influenza,inj,Quad PF,6+ Mos 03/16/2017, 03/08/2018, 03/28/2019, 02/28/2021   Influenza-Unspecified 03/18/2013, 03/05/2020   PFIZER(Purple Top)SARS-COV-2 Vaccination 06/30/2019, 07/20/2019, 03/16/2020, 09/17/2020, 03/08/2021   PNEUMOCOCCAL CONJUGATE-20 07/14/2021   Pfizer Covid-19 Vaccine Bivalent Booster 28yrs & up 03/27/2022   Pneumococcal Polysaccharide-23 06/09/2013   Respiratory Syncytial Virus Vaccine,Recomb Aduvanted(Arexvy) 03/27/2022   Tdap 07/06/2015   Zoster Recombinant(Shingrix) 01/28/2019, 04/03/2019    TDAP status: Up to date  Flu Vaccine status: Up to date  Pneumococcal vaccine status: Up to date  Covid-19 vaccine status: Completed vaccines  Qualifies for Shingles Vaccine? Yes   Zostavax completed Yes   Shingrix Completed?: Yes  Screening Tests Health Maintenance  Topic Date Due   Hepatitis C Screening  Never done   INFLUENZA VACCINE  01/17/2023   Medicare Annual Wellness (AWV)  01/07/2024   DTaP/Tdap/Td (2 - Td or Tdap) 07/05/2025   Pneumonia  Vaccine 50+ Years old  Completed   DEXA SCAN  Completed   Zoster Vaccines- Shingrix  Completed   HPV VACCINES  Aged Out   Colonoscopy  Discontinued   COVID-19 Vaccine  Discontinued    Health Maintenance  Health Maintenance Due  Topic Date Due   Hepatitis C Screening  Never done    Colorectal cancer screening: No longer required.   Mammogram status: Completed  10/31/2022. Repeat every year  Bone Density status: Completed 06/29/2021.   Lung Cancer Screening: (Low Dose CT Chest recommended if Age 50-80 years, 20 pack-year currently smoking OR have quit w/in 15years.) does not qualify.   Lung Cancer Screening Referral: no  Additional Screening:  Hepatitis C Screening: does qualify;   Vision Screening: Recommended annual ophthalmology exams for early detection of glaucoma and other disorders of the eye. Is the patient up to date with their annual eye exam?  Yes  Who is the provider or what is the name of the office in which the patient attends annual eye exams? [facility] If pt is not established with a provider, would they like to be referred to a provider to establish care? No .   Dental Screening: Recommended annual dental exams for proper oral hygiene  Diabetic Foot Exam: n/a  Community Resource Referral / Chronic Care Management: CRR required this visit?  No   CCM required this visit?  No     Plan:     I have personally reviewed and noted the following in the patient's chart:   Medical and social history Use of alcohol, tobacco or illicit drugs  Current medications and supplements including opioid prescriptions. Patient is not currently taking opioid prescriptions. Functional ability and status Nutritional status Physical activity Advanced directives List of other physicians Hospitalizations, surgeries, and ER visits in previous 12 months Vitals Screenings to include cognitive, depression, and falls Referrals and appointments  In addition, I have reviewed and discussed with patient certain preventive protocols, quality metrics, and best practice recommendations. A written personalized care plan for preventive services as well as general preventive health recommendations were provided to patient.     Barb Merino, LPN   8/41/3244   After Visit Summary: (MyChart) Due to this being a telephonic visit, the after visit  summary with patients personalized plan was offered to patient via MyChart   Nurse Notes: none

## 2023-01-10 ENCOUNTER — Encounter: Payer: Self-pay | Admitting: Family Medicine

## 2023-01-10 ENCOUNTER — Ambulatory Visit (INDEPENDENT_AMBULATORY_CARE_PROVIDER_SITE_OTHER): Payer: Medicare Other | Admitting: Family Medicine

## 2023-01-10 VITALS — BP 132/72 | HR 67 | Temp 97.1°F | Ht 59.0 in | Wt 96.2 lb

## 2023-01-10 DIAGNOSIS — I10 Essential (primary) hypertension: Secondary | ICD-10-CM

## 2023-01-10 DIAGNOSIS — E559 Vitamin D deficiency, unspecified: Secondary | ICD-10-CM | POA: Diagnosis not present

## 2023-01-10 DIAGNOSIS — E871 Hypo-osmolality and hyponatremia: Secondary | ICD-10-CM | POA: Diagnosis not present

## 2023-01-10 DIAGNOSIS — E039 Hypothyroidism, unspecified: Secondary | ICD-10-CM | POA: Diagnosis not present

## 2023-01-10 DIAGNOSIS — E7800 Pure hypercholesterolemia, unspecified: Secondary | ICD-10-CM | POA: Diagnosis not present

## 2023-01-10 LAB — CBC WITH DIFFERENTIAL/PLATELET
Basophils Absolute: 0 10*3/uL (ref 0.0–0.1)
Basophils Relative: 0.4 % (ref 0.0–3.0)
Eosinophils Absolute: 0 10*3/uL (ref 0.0–0.7)
Eosinophils Relative: 0.8 % (ref 0.0–5.0)
HCT: 39.8 % (ref 36.0–46.0)
Hemoglobin: 13.4 g/dL (ref 12.0–15.0)
Lymphocytes Relative: 21.1 % (ref 12.0–46.0)
Lymphs Abs: 1.2 10*3/uL (ref 0.7–4.0)
MCHC: 33.6 g/dL (ref 30.0–36.0)
MCV: 90.7 fl (ref 78.0–100.0)
Monocytes Absolute: 0.4 10*3/uL (ref 0.1–1.0)
Monocytes Relative: 6.8 % (ref 3.0–12.0)
Neutro Abs: 4 10*3/uL (ref 1.4–7.7)
Neutrophils Relative %: 70.9 % (ref 43.0–77.0)
Platelets: 295 10*3/uL (ref 150.0–400.0)
RBC: 4.39 Mil/uL (ref 3.87–5.11)
RDW: 13.1 % (ref 11.5–15.5)
WBC: 5.7 10*3/uL (ref 4.0–10.5)

## 2023-01-10 LAB — LIPID PANEL
Cholesterol: 188 mg/dL (ref 0–200)
HDL: 69.7 mg/dL (ref >39.00)
LDL Cholesterol: 102 mg/dL — ABNORMAL HIGH (ref 0–99)
NonHDL: 117.94
Total CHOL/HDL Ratio: 3
Triglycerides: 79 mg/dL (ref 0.0–149.0)
VLDL: 15.8 mg/dL (ref 0.0–40.0)

## 2023-01-10 LAB — COMPREHENSIVE METABOLIC PANEL
ALT: 13 U/L (ref 0–35)
AST: 15 U/L (ref 0–37)
Albumin: 4.2 g/dL (ref 3.5–5.2)
Alkaline Phosphatase: 50 U/L (ref 39–117)
BUN: 9 mg/dL (ref 6–23)
CO2: 28 mEq/L (ref 19–32)
Calcium: 9.4 mg/dL (ref 8.4–10.5)
Chloride: 98 mEq/L (ref 96–112)
Creatinine, Ser: 0.78 mg/dL (ref 0.40–1.20)
GFR: 73.35 mL/min (ref >60.00)
Glucose, Bld: 90 mg/dL (ref 70–99)
Potassium: 4.7 mEq/L (ref 3.5–5.1)
Sodium: 131 mEq/L — ABNORMAL LOW (ref 135–145)
Total Bilirubin: 0.8 mg/dL (ref 0.2–1.2)
Total Protein: 6.2 g/dL (ref 6.0–8.3)

## 2023-01-10 LAB — TSH: TSH: 1.09 u[IU]/mL (ref 0.35–5.50)

## 2023-01-10 LAB — VITAMIN D 25 HYDROXY (VIT D DEFICIENCY, FRACTURES): VITD: 37.86 ng/mL (ref 30.00–100.00)

## 2023-01-10 NOTE — Progress Notes (Addendum)
Established Patient Office Visit   Subjective:  Patient ID: Martha Swanson, female    DOB: [birth year]  Age: 77 y.o. MRN: [id number]  Chief Complaint  Patient presents with   Medical Management of Chronic Issues    6 month follow up. Pt is fasting. Pt wants to know if she should still take Fosamax.     HPI Encounter Diagnoses  Name Primary?   Acquired hypothyroidism Yes   Essential hypertension    Vitamin D deficiency    Elevated LDL cholesterol level    Hyponatremia    For follow-up of above.  Continues to exercise regularly by walking with her husband.  She discontinued Fosamax after completion of her last prescription.  She has been taking it since 2015.  She assures compliance with lisinopril 10 mg.  Blood pressure at home runs in the 120s to 130s over 70s to 80s   Review of Systems  Constitutional: Negative.   HENT: Negative.    Eyes:  Negative for blurred vision, discharge and redness.  Respiratory: Negative.    Cardiovascular: Negative.   Gastrointestinal:  Negative for abdominal pain.  Genitourinary: Negative.   Musculoskeletal: Negative.  Negative for myalgias.  Skin:  Negative for rash.  Neurological:  Negative for tingling, loss of consciousness and weakness.  Endo/Heme/Allergies:  Negative for polydipsia.     Current Outpatient Medications:    calcium carbonate (OS-CAL) 600 MG TABS tablet, Take 1 tablet (600 mg total) by mouth 2 (two) times daily with a meal., Disp: 60 tablet, Rfl: 12   levothyroxine (SYNTHROID) 50 MCG tablet, TAKE 1 TABLET BY MOUTH ONCE DAILY BEFORE BREAKFAST . REFILLS, Disp: 90 tablet, Rfl: 2   lisinopril (ZESTRIL) 10 MG tablet, Take 1 tablet (10 mg total) by mouth daily., Disp: 90 tablet, Rfl: 2   Objective:     BP 132/72   Pulse 67   Temp (!) 97.1 F (36.2 C)   Ht 4\' 11"  (1.499 m)   Wt 96 lb 3.2 oz (43.6 kg)   SpO2 99%   BMI 19.43 kg/m    Physical Exam Constitutional:      General: She is not in acute distress.     Appearance: Normal appearance. She is not ill-appearing, toxic-appearing or diaphoretic.  HENT:     Head: Normocephalic and atraumatic.     Right Ear: External ear normal.     Left Ear: External ear normal.     Mouth/Throat:     Mouth: Mucous membranes are moist.     Pharynx: Oropharynx is clear. No oropharyngeal exudate or posterior oropharyngeal erythema.  Eyes:     General: No scleral icterus.       Right eye: No discharge.        Left eye: No discharge.     Extraocular Movements: Extraocular movements intact.     Conjunctiva/sclera: Conjunctivae normal.     Pupils: Pupils are equal, round, and reactive to light.  Cardiovascular:     Rate and Rhythm: Normal rate and regular rhythm.  Pulmonary:     Effort: Pulmonary effort is normal. No respiratory distress.     Breath sounds: Normal breath sounds.  Musculoskeletal:     Cervical back: No rigidity or tenderness.  Skin:    General: Skin is warm and dry.  Neurological:     Mental Status: She is alert and oriented to person, place, and time.  Psychiatric:        Mood and Affect: Mood normal.  Behavior: Behavior normal.      Results for orders placed or performed in visit on 01/10/23  CBC with Differential/Platelet  Result Value Ref Range   WBC 5.7 4.0 - 10.5 K/uL   RBC 4.39 3.87 - 5.11 Mil/uL   Hemoglobin 13.4 12.0 - 15.0 g/dL   HCT 09.8 11.9 - 14.7 %   MCV 90.7 78.0 - 100.0 fl   MCHC 33.6 30.0 - 36.0 g/dL   RDW 82.9 56.2 - 13.0 %   Platelets 295.0 150.0 - 400.0 K/uL   Neutrophils Relative % 70.9 43.0 - 77.0 %   Lymphocytes Relative 21.1 12.0 - 46.0 %   Monocytes Relative 6.8 3.0 - 12.0 %   Eosinophils Relative 0.8 0.0 - 5.0 %   Basophils Relative 0.4 0.0 - 3.0 %   Neutro Abs 4.0 1.4 - 7.7 K/uL   Lymphs Abs 1.2 0.7 - 4.0 K/uL   Monocytes Absolute 0.4 0.1 - 1.0 K/uL   Eosinophils Absolute 0.0 0.0 - 0.7 K/uL   Basophils Absolute 0.0 0.0 - 0.1 K/uL  Comprehensive metabolic panel  Result Value Ref Range    Sodium 131 (L) 135 - 145 mEq/L   Potassium 4.7 3.5 - 5.1 mEq/L   Chloride 98 96 - 112 mEq/L   CO2 28 19 - 32 mEq/L   Glucose, Bld 90 70 - 99 mg/dL   BUN 9 6 - 23 mg/dL   Creatinine, Ser 8.65 0.40 - 1.20 mg/dL   Total Bilirubin 0.8 0.2 - 1.2 mg/dL   Alkaline Phosphatase 50 39 - 117 U/L   AST 15 0 - 37 U/L   ALT 13 0 - 35 U/L   Total Protein 6.2 6.0 - 8.3 g/dL   Albumin 4.2 3.5 - 5.2 g/dL   GFR 78.46 >96.29 mL/min   Calcium 9.4 8.4 - 10.5 mg/dL  TSH  Result Value Ref Range   TSH 1.09 0.35 - 5.50 uIU/mL  Lipid panel  Result Value Ref Range   Cholesterol 188 0 - 200 mg/dL   Triglycerides 52.8 0.0 - 149.0 mg/dL   HDL 41.32 >44.01 mg/dL   VLDL 02.7 0.0 - 25.3 mg/dL   LDL Cholesterol 664 (H) 0 - 99 mg/dL   Total CHOL/HDL Ratio 3    NonHDL 117.94   VITAMIN D 25 Hydroxy (Vit-D Deficiency, Fractures)  Result Value Ref Range   VITD 37.86 30.00 - 100.00 ng/mL      The 10-year ASCVD risk score (Arnett DK, et al., 2019) is: 27.6%    Assessment & Plan:   Acquired hypothyroidism -     TSH  Essential hypertension -     CBC with Differential/Platelet -     Comprehensive metabolic panel  Vitamin D deficiency -     VITAMIN D 25 Hydroxy (Vit-D Deficiency, Fractures)  Elevated LDL cholesterol level -     Comprehensive metabolic panel -     Lipid panel  Hyponatremia -     Basic metabolic panel; Future    Return in about 6 months (around 07/13/2023), or if symptoms worsen or fail to improve.  Has completed course of therapy with Fosamax.  Will continue with 1200 mg of calcium and vitamin D.  Have offered patient a statin with her elevated risk of vascular disease she has a favorable lipid profile.  She will think about it and let me know.  Information was given on preventing high cholesterol her elevated risk is mainly due to age.  Continue lisinopril for blood pressure control.  Continue exercising and maintaining a healthy weight.  [name], MD

## 2023-01-11 DIAGNOSIS — E871 Hypo-osmolality and hyponatremia: Secondary | ICD-10-CM | POA: Insufficient documentation

## 2023-01-11 NOTE — Addendum Note (Signed)
Addended by: Andrez Grime on: 01/11/2023 12:52 PM   Modules accepted: Orders

## 2023-02-28 DIAGNOSIS — Z23 Encounter for immunization: Secondary | ICD-10-CM | POA: Diagnosis not present

## 2023-03-11 DIAGNOSIS — Z23 Encounter for immunization: Secondary | ICD-10-CM | POA: Diagnosis not present

## 2023-03-13 ENCOUNTER — Encounter (INDEPENDENT_AMBULATORY_CARE_PROVIDER_SITE_OTHER): Payer: Self-pay | Admitting: *Deleted

## 2023-03-22 ENCOUNTER — Other Ambulatory Visit: Payer: Self-pay | Admitting: Family Medicine

## 2023-03-22 DIAGNOSIS — H2513 Age-related nuclear cataract, bilateral: Secondary | ICD-10-CM | POA: Diagnosis not present

## 2023-03-22 DIAGNOSIS — E039 Hypothyroidism, unspecified: Secondary | ICD-10-CM

## 2023-04-16 ENCOUNTER — Other Ambulatory Visit: Payer: Self-pay | Admitting: Family Medicine

## 2023-04-16 DIAGNOSIS — I10 Essential (primary) hypertension: Secondary | ICD-10-CM

## 2023-05-12 ENCOUNTER — Inpatient Hospital Stay (HOSPITAL_COMMUNITY)
Admission: EM | Admit: 2023-05-12 | Discharge: 2023-05-15 | DRG: 286 | Disposition: A | Discharge status: Home / Self Care | Payer: Medicare Other | Attending: Cardiology | Admitting: Cardiology

## 2023-05-12 DIAGNOSIS — J90 Pleural effusion, not elsewhere classified: Secondary | ICD-10-CM | POA: Diagnosis not present

## 2023-05-12 DIAGNOSIS — Z79890 Hormone replacement therapy (postmenopausal): Secondary | ICD-10-CM

## 2023-05-12 DIAGNOSIS — R110 Nausea: Secondary | ICD-10-CM | POA: Diagnosis not present

## 2023-05-12 DIAGNOSIS — I509 Heart failure, unspecified: Secondary | ICD-10-CM

## 2023-05-12 DIAGNOSIS — R7989 Other specified abnormal findings of blood chemistry: Secondary | ICD-10-CM | POA: Diagnosis present

## 2023-05-12 DIAGNOSIS — E785 Hyperlipidemia, unspecified: Secondary | ICD-10-CM | POA: Diagnosis present

## 2023-05-12 DIAGNOSIS — I5023 Acute on chronic systolic (congestive) heart failure: Secondary | ICD-10-CM | POA: Diagnosis present

## 2023-05-12 DIAGNOSIS — Z880 Allergy status to penicillin: Secondary | ICD-10-CM | POA: Diagnosis not present

## 2023-05-12 DIAGNOSIS — I959 Hypotension, unspecified: Secondary | ICD-10-CM | POA: Diagnosis present

## 2023-05-12 DIAGNOSIS — E871 Hypo-osmolality and hyponatremia: Secondary | ICD-10-CM | POA: Diagnosis not present

## 2023-05-12 DIAGNOSIS — Z90710 Acquired absence of both cervix and uterus: Secondary | ICD-10-CM

## 2023-05-12 DIAGNOSIS — Z8249 Family history of ischemic heart disease and other diseases of the circulatory system: Secondary | ICD-10-CM | POA: Diagnosis not present

## 2023-05-12 DIAGNOSIS — I5181 Takotsubo syndrome: Secondary | ICD-10-CM | POA: Diagnosis not present

## 2023-05-12 DIAGNOSIS — I214 Non-ST elevation (NSTEMI) myocardial infarction: Secondary | ICD-10-CM | POA: Diagnosis not present

## 2023-05-12 DIAGNOSIS — I110 Hypertensive heart disease with heart failure: Principal | ICD-10-CM | POA: Diagnosis present

## 2023-05-12 DIAGNOSIS — Z79899 Other long term (current) drug therapy: Secondary | ICD-10-CM | POA: Diagnosis not present

## 2023-05-12 DIAGNOSIS — E8809 Other disorders of plasma-protein metabolism, not elsewhere classified: Secondary | ICD-10-CM | POA: Diagnosis present

## 2023-05-12 DIAGNOSIS — R531 Weakness: Secondary | ICD-10-CM | POA: Diagnosis not present

## 2023-05-12 DIAGNOSIS — R0689 Other abnormalities of breathing: Secondary | ICD-10-CM | POA: Diagnosis not present

## 2023-05-12 DIAGNOSIS — I21A1 Myocardial infarction type 2: Secondary | ICD-10-CM

## 2023-05-12 DIAGNOSIS — E890 Postprocedural hypothyroidism: Secondary | ICD-10-CM | POA: Diagnosis not present

## 2023-05-12 DIAGNOSIS — R5383 Other fatigue: Secondary | ICD-10-CM | POA: Diagnosis not present

## 2023-05-12 DIAGNOSIS — I428 Other cardiomyopathies: Secondary | ICD-10-CM | POA: Diagnosis not present

## 2023-05-12 DIAGNOSIS — R918 Other nonspecific abnormal finding of lung field: Secondary | ICD-10-CM | POA: Diagnosis not present

## 2023-05-12 DIAGNOSIS — I499 Cardiac arrhythmia, unspecified: Secondary | ICD-10-CM | POA: Diagnosis not present

## 2023-05-12 DIAGNOSIS — R079 Chest pain, unspecified: Secondary | ICD-10-CM | POA: Diagnosis not present

## 2023-05-13 ENCOUNTER — Encounter (HOSPITAL_COMMUNITY): Payer: Self-pay

## 2023-05-13 ENCOUNTER — Other Ambulatory Visit: Payer: Self-pay

## 2023-05-13 ENCOUNTER — Emergency Department (HOSPITAL_COMMUNITY): Payer: Medicare Other

## 2023-05-13 DIAGNOSIS — J90 Pleural effusion, not elsewhere classified: Secondary | ICD-10-CM | POA: Diagnosis not present

## 2023-05-13 DIAGNOSIS — R110 Nausea: Secondary | ICD-10-CM | POA: Diagnosis not present

## 2023-05-13 DIAGNOSIS — R918 Other nonspecific abnormal finding of lung field: Secondary | ICD-10-CM | POA: Diagnosis not present

## 2023-05-13 DIAGNOSIS — I509 Heart failure, unspecified: Secondary | ICD-10-CM | POA: Diagnosis not present

## 2023-05-13 DIAGNOSIS — I428 Other cardiomyopathies: Secondary | ICD-10-CM | POA: Diagnosis not present

## 2023-05-13 DIAGNOSIS — Z8249 Family history of ischemic heart disease and other diseases of the circulatory system: Secondary | ICD-10-CM | POA: Diagnosis not present

## 2023-05-13 DIAGNOSIS — Z79899 Other long term (current) drug therapy: Secondary | ICD-10-CM | POA: Diagnosis not present

## 2023-05-13 DIAGNOSIS — R079 Chest pain, unspecified: Secondary | ICD-10-CM | POA: Diagnosis not present

## 2023-05-13 DIAGNOSIS — R531 Weakness: Secondary | ICD-10-CM | POA: Diagnosis not present

## 2023-05-13 DIAGNOSIS — E785 Hyperlipidemia, unspecified: Secondary | ICD-10-CM | POA: Diagnosis present

## 2023-05-13 DIAGNOSIS — E890 Postprocedural hypothyroidism: Secondary | ICD-10-CM | POA: Diagnosis present

## 2023-05-13 DIAGNOSIS — I5023 Acute on chronic systolic (congestive) heart failure: Secondary | ICD-10-CM | POA: Diagnosis not present

## 2023-05-13 DIAGNOSIS — I110 Hypertensive heart disease with heart failure: Secondary | ICD-10-CM | POA: Diagnosis present

## 2023-05-13 DIAGNOSIS — Z90710 Acquired absence of both cervix and uterus: Secondary | ICD-10-CM | POA: Diagnosis not present

## 2023-05-13 DIAGNOSIS — E8809 Other disorders of plasma-protein metabolism, not elsewhere classified: Secondary | ICD-10-CM | POA: Diagnosis present

## 2023-05-13 DIAGNOSIS — E871 Hypo-osmolality and hyponatremia: Secondary | ICD-10-CM | POA: Diagnosis present

## 2023-05-13 DIAGNOSIS — R7989 Other specified abnormal findings of blood chemistry: Secondary | ICD-10-CM | POA: Diagnosis present

## 2023-05-13 DIAGNOSIS — I5181 Takotsubo syndrome: Secondary | ICD-10-CM | POA: Diagnosis not present

## 2023-05-13 DIAGNOSIS — Z79890 Hormone replacement therapy (postmenopausal): Secondary | ICD-10-CM | POA: Diagnosis not present

## 2023-05-13 DIAGNOSIS — Z880 Allergy status to penicillin: Secondary | ICD-10-CM | POA: Diagnosis not present

## 2023-05-13 DIAGNOSIS — I959 Hypotension, unspecified: Secondary | ICD-10-CM | POA: Diagnosis present

## 2023-05-13 LAB — CBC
HCT: 33.9 % — ABNORMAL LOW (ref 36.0–46.0)
HCT: 33.2 % — ABNORMAL LOW (ref 36.0–46.0)
Hemoglobin: 11.9 g/dL — ABNORMAL LOW (ref 12.0–15.0)
Hemoglobin: 11.7 g/dL — ABNORMAL LOW (ref 12.0–15.0)
MCH: 30.4 pg (ref 26.0–34.0)
MCH: 29.9 pg (ref 26.0–34.0)
MCHC: 35.1 g/dL (ref 30.0–36.0)
MCHC: 35.2 g/dL (ref 30.0–36.0)
MCV: 86.5 fL (ref 80.0–100.0)
MCV: 84.9 fL (ref 80.0–100.0)
Platelets: 272 10*3/uL (ref 150–400)
Platelets: 264 10*3/uL (ref 150–400)
RBC: 3.92 MIL/uL (ref 3.87–5.11)
RBC: 3.91 MIL/uL (ref 3.87–5.11)
RDW: 12.2 % (ref 11.5–15.5)
RDW: 12.3 % (ref 11.5–15.5)
WBC: 12.9 10*3/uL — ABNORMAL HIGH (ref 4.0–10.5)
WBC: 7.9 10*3/uL (ref 4.0–10.5)
nRBC: 0 % (ref 0.0–0.2)
nRBC: 0 % (ref 0.0–0.2)

## 2023-05-13 LAB — CBC WITH DIFFERENTIAL/PLATELET
Abs Immature Granulocytes: 0.04 10*3/uL (ref 0.00–0.07)
Basophils Absolute: 0 10*3/uL (ref 0.0–0.1)
Basophils Relative: 0 %
Eosinophils Absolute: 0.1 10*3/uL (ref 0.0–0.5)
Eosinophils Relative: 1 %
HCT: 35.8 % — ABNORMAL LOW (ref 36.0–46.0)
Hemoglobin: 12.5 g/dL (ref 12.0–15.0)
Immature Granulocytes: 0 %
Lymphocytes Relative: 14 %
Lymphs Abs: 1.7 10*3/uL (ref 0.7–4.0)
MCH: 30.8 pg (ref 26.0–34.0)
MCHC: 34.9 g/dL (ref 30.0–36.0)
MCV: 88.2 fL (ref 80.0–100.0)
Monocytes Absolute: 0.6 10*3/uL (ref 0.1–1.0)
Monocytes Relative: 5 %
Neutro Abs: 9.9 10*3/uL — ABNORMAL HIGH (ref 1.7–7.7)
Neutrophils Relative %: 80 %
Platelets: 315 10*3/uL (ref 150–400)
RBC: 4.06 MIL/uL (ref 3.87–5.11)
RDW: 12.3 % (ref 11.5–15.5)
WBC: 12.3 10*3/uL — ABNORMAL HIGH (ref 4.0–10.5)
nRBC: 0 % (ref 0.0–0.2)

## 2023-05-13 LAB — BASIC METABOLIC PANEL
Anion gap: 6 (ref 5–15)
Anion gap: 6 (ref 5–15)
BUN: 12 mg/dL (ref 8–23)
BUN: 16 mg/dL (ref 8–23)
CO2: 21 mmol/L — ABNORMAL LOW (ref 22–32)
CO2: 22 mmol/L (ref 22–32)
Calcium: 8.3 mg/dL — ABNORMAL LOW (ref 8.9–10.3)
Calcium: 8.3 mg/dL — ABNORMAL LOW (ref 8.9–10.3)
Chloride: 94 mmol/L — ABNORMAL LOW (ref 98–111)
Chloride: 91 mmol/L — ABNORMAL LOW (ref 98–111)
Creatinine, Ser: 0.78 mg/dL (ref 0.44–1.00)
Creatinine, Ser: 0.85 mg/dL (ref 0.44–1.00)
GFR, Estimated: >60 mL/min (ref >60)
GFR, Estimated: >60 mL/min (ref >60)
Glucose, Bld: 111 mg/dL — ABNORMAL HIGH (ref 70–99)
Glucose, Bld: 108 mg/dL — ABNORMAL HIGH (ref 70–99)
Potassium: 4.3 mmol/L (ref 3.5–5.1)
Potassium: 4 mmol/L (ref 3.5–5.1)
Sodium: 121 mmol/L — ABNORMAL LOW (ref 135–145)
Sodium: 119 mmol/L — CL (ref 135–145)

## 2023-05-13 LAB — URINALYSIS, W/ REFLEX TO CULTURE (INFECTION SUSPECTED)
Bacteria, UA: NONE SEEN
Bilirubin Urine: NEGATIVE
Glucose, UA: NEGATIVE mg/dL
Hgb urine dipstick: NEGATIVE
Ketones, ur: 5 mg/dL — AB
Leukocytes,Ua: NEGATIVE
Nitrite: NEGATIVE
Protein, ur: NEGATIVE mg/dL
Specific Gravity, Urine: 1.006 (ref 1.005–1.030)
pH: 5 (ref 5.0–8.0)

## 2023-05-13 LAB — TROPONIN I (HIGH SENSITIVITY)
Troponin I (High Sensitivity): 1312 ng/L (ref <18)
Troponin I (High Sensitivity): 1588 ng/L (ref <18)

## 2023-05-13 LAB — HEMOGLOBIN A1C
Hgb A1c MFr Bld: 5.2 % (ref 4.8–5.6)
Mean Plasma Glucose: 102.54 mg/dL

## 2023-05-13 LAB — COMPREHENSIVE METABOLIC PANEL
ALT: 10 U/L (ref 0–44)
AST: 28 U/L (ref 15–41)
Albumin: 3.2 g/dL — ABNORMAL LOW (ref 3.5–5.0)
Alkaline Phosphatase: 42 U/L (ref 38–126)
Anion gap: 9 (ref 5–15)
BUN: 15 mg/dL (ref 8–23)
CO2: 20 mmol/L — ABNORMAL LOW (ref 22–32)
Calcium: 8.4 mg/dL — ABNORMAL LOW (ref 8.9–10.3)
Chloride: 88 mmol/L — ABNORMAL LOW (ref 98–111)
Creatinine, Ser: 0.86 mg/dL (ref 0.44–1.00)
GFR, Estimated: >60 mL/min (ref >60)
Glucose, Bld: 138 mg/dL — ABNORMAL HIGH (ref 70–99)
Potassium: 4.1 mmol/L (ref 3.5–5.1)
Sodium: 117 mmol/L — CL (ref 135–145)
Total Bilirubin: 1 mg/dL (ref <1.2)
Total Protein: 5.3 g/dL — ABNORMAL LOW (ref 6.5–8.1)

## 2023-05-13 LAB — TSH: TSH: 2.423 u[IU]/mL (ref 0.350–4.500)

## 2023-05-13 LAB — HEPARIN LEVEL (UNFRACTIONATED)
Heparin Unfractionated: <0.1 [IU]/mL — ABNORMAL LOW (ref 0.30–0.70)
Heparin Unfractionated: <0.1 [IU]/mL — ABNORMAL LOW (ref 0.30–0.70)

## 2023-05-13 LAB — I-STAT CHEM 8, ED
BUN: 16 mg/dL (ref 8–23)
Calcium, Ion: 1.13 mmol/L — ABNORMAL LOW (ref 1.15–1.40)
Chloride: 87 mmol/L — ABNORMAL LOW (ref 98–111)
Creatinine, Ser: 0.8 mg/dL (ref 0.44–1.00)
Glucose, Bld: 124 mg/dL — ABNORMAL HIGH (ref 70–99)
HCT: 35 % — ABNORMAL LOW (ref 36.0–46.0)
Hemoglobin: 11.9 g/dL — ABNORMAL LOW (ref 12.0–15.0)
Potassium: 4.5 mmol/L (ref 3.5–5.1)
Sodium: 121 mmol/L — ABNORMAL LOW (ref 135–145)
TCO2: 23 mmol/L (ref 22–32)

## 2023-05-13 LAB — MAGNESIUM: Magnesium: 1.8 mg/dL (ref 1.7–2.4)

## 2023-05-13 LAB — OSMOLALITY, URINE: Osmolality, Ur: 286 mosm/kg — ABNORMAL LOW (ref 300–900)

## 2023-05-13 LAB — OSMOLALITY: Osmolality: 258 mosm/kg — ABNORMAL LOW (ref 275–295)

## 2023-05-13 LAB — SODIUM, URINE, RANDOM: Sodium, Ur: 55 mmol/L

## 2023-05-13 LAB — BRAIN NATRIURETIC PEPTIDE: B Natriuretic Peptide: 1165.8 pg/mL — ABNORMAL HIGH (ref 0.0–100.0)

## 2023-05-13 MED ORDER — SODIUM CHLORIDE 0.9% FLUSH
3.0000 mL | Freq: Two times a day (BID) | INTRAVENOUS | Status: DC
  Administered 2023-05-13 – 2023-05-15 (×3): 3 mL via INTRAVENOUS

## 2023-05-13 MED ORDER — SODIUM CHLORIDE 0.9% FLUSH: 3.0000 mL | INTRAVENOUS | Status: DC | PRN

## 2023-05-13 MED ORDER — NITROGLYCERIN 0.4 MG SL SUBL: 0.4000 mg | SUBLINGUAL_TABLET | SUBLINGUAL | Status: DC | PRN

## 2023-05-13 MED ORDER — FUROSEMIDE 20 MG PO TABS
20.0000 mg | ORAL_TABLET | Freq: Two times a day (BID) | ORAL | Status: DC
  Administered 2023-05-13 – 2023-05-15 (×4): 20 mg via ORAL
  Filled 2023-05-13 (×4): qty 1

## 2023-05-13 MED ORDER — ORAL CARE MOUTH RINSE: 15.0000 mL | OROMUCOSAL | Status: DC | PRN

## 2023-05-13 MED ORDER — FUROSEMIDE 10 MG/ML IJ SOLN
20.0000 mg | Freq: Once | INTRAMUSCULAR | Status: AC
  Administered 2023-05-13: 20 mg via INTRAVENOUS
  Filled 2023-05-13: qty 2

## 2023-05-13 MED ORDER — ONDANSETRON HCL 4 MG/2ML IJ SOLN: 4.0000 mg | Freq: Four times a day (QID) | INTRAMUSCULAR | Status: DC | PRN

## 2023-05-13 MED ORDER — ACETAMINOPHEN 325 MG PO TABS
650.0000 mg | ORAL_TABLET | ORAL | Status: DC | PRN
  Administered 2023-05-15: 650 mg via ORAL
  Filled 2023-05-13: qty 2

## 2023-05-13 MED ORDER — ASPIRIN 81 MG PO CHEW: 324.0000 mg | CHEWABLE_TABLET | Freq: Once | ORAL | Status: DC

## 2023-05-13 MED ORDER — FUROSEMIDE 10 MG/ML IJ SOLN
40.0000 mg | Freq: Once | INTRAMUSCULAR | Status: AC
  Administered 2023-05-13: 40 mg via INTRAVENOUS
  Filled 2023-05-13: qty 4

## 2023-05-13 MED ORDER — ASPIRIN 81 MG PO CHEW
81.0000 mg | CHEWABLE_TABLET | Freq: Once | ORAL | Status: AC
  Administered 2023-05-13: 81 mg via ORAL
  Filled 2023-05-13: qty 1

## 2023-05-13 MED ORDER — LEVOTHYROXINE SODIUM 50 MCG PO TABS
50.0000 ug | ORAL_TABLET | Freq: Every day | ORAL | Status: DC
  Administered 2023-05-14 – 2023-05-15 (×2): 50 ug via ORAL
  Filled 2023-05-13 (×2): qty 1

## 2023-05-13 MED ORDER — ASPIRIN 81 MG PO CHEW
81.0000 mg | CHEWABLE_TABLET | ORAL | Status: AC
  Administered 2023-05-14: 81 mg via ORAL
  Filled 2023-05-13: qty 1

## 2023-05-13 MED ORDER — HEPARIN (PORCINE) 25000 UT/250ML-% IV SOLN
800.0000 [IU]/h | INTRAVENOUS | Status: DC
  Administered 2023-05-13: 500 [IU]/h via INTRAVENOUS
  Filled 2023-05-13: qty 250

## 2023-05-13 MED ORDER — MORPHINE SULFATE (PF) 2 MG/ML IV SOLN: 1.0000 mg | INTRAVENOUS | Status: DC | PRN

## 2023-05-13 MED ORDER — SODIUM CHLORIDE 0.9 % IV BOLUS
500.0000 mL | Freq: Once | INTRAVENOUS | Status: AC
Start: 2023-05-13 — End: 2023-05-13
  Administered 2023-05-13: 500 mL via INTRAVENOUS

## 2023-05-13 MED ORDER — ROSUVASTATIN CALCIUM 5 MG PO TABS
5.0000 mg | ORAL_TABLET | Freq: Every day | ORAL | Status: DC
  Administered 2023-05-13 – 2023-05-15 (×3): 5 mg via ORAL
  Filled 2023-05-13 (×3): qty 1

## 2023-05-13 MED ORDER — SODIUM CHLORIDE 0.9% FLUSH
10.0000 mL | Freq: Two times a day (BID) | INTRAVENOUS | Status: DC
  Administered 2023-05-14: 10 mL via INTRAVENOUS

## 2023-05-13 MED ORDER — SODIUM CHLORIDE 0.9 % IV SOLN: 250.0000 mL | INTRAVENOUS | Status: DC | PRN

## 2023-05-13 MED ORDER — HEPARIN BOLUS VIA INFUSION
2500.0000 [IU] | Freq: Once | INTRAVENOUS | Status: AC
  Administered 2023-05-13: 2500 [IU] via INTRAVENOUS
  Filled 2023-05-13: qty 2500

## 2023-05-13 NOTE — Progress Notes (Signed)
PROGRESS NOTE  Martha Swanson  DOB: [birth year]  PCP: [name], MD UXL:[id number]  DOA: 05/12/2023  LOS: 0 days  Hospital Day: 2  Brief narrative: Martha Swanson is a 77 y.o. female with PMH significant for Takotsubo cardiomyopathy 2012, HTN, HLD, hypothyroidism. 11/24, patient was brought to the ED from home by EMS after an episode of chest pain radiating to the left arm. Patient took aspirin at home and EMS gave Zofran sublingual nitroglycerin.   Of note, patient also reports that for the last 1 week, he has been feeling weak and unable to complete her routine tasks.  She also developed dyspnea and some vague episodes of left shoulder pain.  She has not been able to sleep well and feels tired all the time. She feels that her symptoms are similar to what when she had Takotsubo cardiomyopathy in 2012  In the ED, patient was afebrile, heart rate 90s, blood pressure in 90s, breathing on room air Initial labs with sodium low at 117, BNP elevated to 1100, troponin elevated to 1300, WBC count 12.3 Urinalysis clear.  Urine osmolality low at 286, urine sodium at 55 Chest x-ray showed pulm edema Patient was admitted to cardiology service.  Heparin GTT was started Hospitalist service consulted for medical management   Subjective: Patient was seen and examined this morning.  Pleasant elderly Caucasian female.  Propped up in bed.  Not in distress.  Does not look volume overloaded to me.  On 2 L oxygen for comfort.  Does not use oxygen at home.  Husband at bedside. Overnight, heart rate in 90s, blood pressure in low 100s Most recent labs from 1 AM last night showed sodium at 121, troponin further up to 1600  Assessment and plan: Suspected acute exacerbation of CHF H/o Takotsubo cardiomyopathy 2012 Hypertension Patient feels that her symptoms are similar to last episode of CHF.  Unclear most recent EF.  Unable to find any echocardiogram in the record. Cardiology following TTE  ordered 1 dose of Lasix IV 20 mg was given in the ED. PTA meds-lisinopril 10 mg daily.  Currently on hold. Defer to cardiology for any further need of diuresis.  NSTEMI HLD Also reports chest pain. Elevated troponin could be due to LV strain but also need to rule out type I MI since troponin is significantly elevated To look for WMA in echo Continue heparin drip On aspirin 81 mg daily.  Crestor added by cardiology this morning.  Hyponatremia Present with significantly low sodium of 117 and osmolality of 258 Urine osmolality also low at 286 which rules out SIADH. Low sodium likely due to CHF. Awaiting echo Repeat BMP this morning pending Recent Labs  Lab 05/13/23 0008 05/13/23 0110  NA 117* 121*   Progressive weakness Likely due to hyponatremia versus CHF May need PT eval  Hypothyroidism Continue Synthroid.  TSH in normal range Recent Labs    01/10/23 0856 05/13/23 0237  TSH 1.09 2.423    Mobility: PT eval ordered  Goals of care   Code Status: Full Code     DVT prophylaxis: Heparin drip currently    Antimicrobials: None Fluid: None Family Communication: Husband at bedside  Status: Inpatient Level of care:  Progressive   Patient is from: Home Needs to continue in-hospital care: Pending cardiology workup Anticipated d/c to: Home hopefully in 1 to 2 days      Diet:  Diet Order     None       Scheduled Meds:  aspirin  324 mg Oral Once   rosuvastatin  5 mg Oral Daily    PRN meds:    Infusions:   heparin 500 Units/hr (05/13/23 0238)    Antimicrobials: Anti-infectives (From admission, onward)    None       Objective: Vitals:   05/13/23 1000 05/13/23 1001  BP: 99/64   Pulse: 91   Resp: 20   Temp:  97.9 F (36.6 C)  SpO2: 96%     Intake/Output Summary (Last 24 hours) at 05/13/2023 1027 Last data filed at 05/13/2023 1023 Gross per 24 hour  Intake 30 ml  Output 480 ml  Net -450 ml   Filed Weights   05/13/23 0010   Weight: 43.5 kg   Weight change:  Body mass index is 18.75 kg/m.   Physical Exam: General exam: Pleasant, elderly Caucasian female.  Not in distress Skin: No rashes, lesions or ulcers. HEENT: Atraumatic, normocephalic, no obvious bleeding Lungs: Clear to auscultation bilaterally on my exam CVS: Regular rate and rhythm, systolic ejection murmur present GI/Abd soft, nontender, nondistended, bowel sound present CNS: Alert, awake, oriented x 3 Psychiatry: Mood appropriate Extremities: No pedal edema, no calf tenderness  Data Review: I have personally reviewed the laboratory data and studies available.  F/u labs ordered Unresulted Labs (From admission, onward)     Start     Ordered   05/14/23 0500  Heparin level (unfractionated)  Daily,   R      05/13/23 0218   05/13/23 1000  Heparin level (unfractionated)  Once-Timed,   URGENT        05/13/23 0218   05/13/23 0942  Hemoglobin A1c  Add-on,   AD        05/13/23 0942   05/13/23 0900  Basic metabolic panel  Now then every 8 hours,   R      05/13/23 0427   05/13/23 0500  CBC  Daily,   R      05/13/23 0218   Signed and Held  Basic metabolic panel  Daily,   R     Comments: As Scheduled for 5 days    Signed and Held   Signed and Held  CBC  (heparin)  Once,   R       Comments: Baseline for heparin therapy IF NOT ALREADY DRAWN.  Notify MD if PLT < 100 K.    Signed and Held   Signed and Held  Creatinine, serum  (heparin)  Once,   R       Comments: Baseline for heparin therapy IF NOT ALREADY DRAWN.    Signed and Held   Signed and Held  Magnesium  Daily,   R      Signed and Held            Total time spent in review of labs and imaging, patient evaluation, formulation of plan, documentation and communication with family: 45 minutes  Signed, Lorin Glass, MD [facility] 05/13/2023

## 2023-05-13 NOTE — ED Notes (Signed)
Hospitalist at bedside 

## 2023-05-13 NOTE — Progress Notes (Signed)
Progress Note  Patient Name: Martha Swanson Date of Encounter: 05/13/2023  Primary Cardiologist: None   Subjective   Patient seen and examined at her bedside. Her husband was in the room when I arrived. Still with some shortness of breath. No chest pain  Inpatient Medications    Scheduled Meds:  aspirin  324 mg Oral Once   Continuous Infusions:  heparin 500 Units/hr (05/13/23 0238)   PRN Meds:    Vital Signs    Vitals:   05/13/23 0545 05/13/23 0600 05/13/23 0606 05/13/23 0615  BP: (!) 97/58 114/69  104/62  Pulse: 82 92  (!) 103  Resp: 18 (!) 21  (!) 21  Temp:   98.6 F (37 C)   TempSrc:   Oral   SpO2: 97% 97%  95%  Weight:      Height:       No intake or output data in the 24 hours ending 05/13/23 0941 Filed Weights   05/13/23 0010  Weight: 43.5 kg    Telemetry    Sinus rhythm - Personally Reviewed  ECG    Sinus rhythm, Hr 89 bpm with noted LVH - Personally Reviewed  Physical Exam    General: Comfortable Head: Atraumatic, normal size  Eyes: PEERLA, EOMI  Neck: Supple, normal JVD Cardiac: Normal S1, S2; RRR; no murmurs, rubs, or gallops Lungs: Clear to auscultation bilaterally Abd: Soft, nontender, no hepatomegaly  Ext: warm, no edema Musculoskeletal: No deformities, BUE and BLE strength normal and equal Skin: Warm and dry, no rashes   Neuro: Alert and oriented to person, place, time, and situation, CNII-XII grossly intact, no focal deficits  Psych: Normal mood and affect   Labs    Chemistry Recent Labs  Lab 05/13/23 0008 05/13/23 0110  NA 117* 121*  K 4.1 4.5  CL 88* 87*  CO2 20*  --   GLUCOSE 138* 124*  BUN 15 16  CREATININE 0.86 0.80  CALCIUM 8.4*  --   PROT 5.3*  --   ALBUMIN 3.2*  --   AST 28  --   ALT 10  --   ALKPHOS 42  --   BILITOT 1.0  --   GFRNONAA >60  --   ANIONGAP 9  --      Hematology Recent Labs  Lab 05/13/23 0008 05/13/23 0110 05/13/23 0237  WBC 12.3*  --  12.9*  RBC 4.06  --  3.92  HGB 12.5 11.9*  11.9*  HCT 35.8* 35.0* 33.9*  MCV 88.2  --  86.5  MCH 30.8  --  30.4  MCHC 34.9  --  35.1  RDW 12.3  --  12.2  PLT 315  --  272    Cardiac EnzymesNo results for input(s): "TROPONINI" in the last 168 hours. No results for input(s): "TROPIPOC" in the last 168 hours.   BNP Recent Labs  Lab 05/13/23 0008  BNP 1,165.8*     DDimer No results for input(s): "DDIMER" in the last 168 hours.   Radiology    DG Chest Portable 1 View  Result Date: 05/13/2023 CLINICAL DATA:  Chest pain radiating down left side of arm. Intermittent nausea, shortness of breath, weakness, and fatigue. EXAM: PORTABLE CHEST 1 VIEW COMPARISON:  02/05/2011. FINDINGS: The heart size and mediastinal contours are within normal limits. There is atherosclerotic calcification of the aorta. Interstitial prominence is noted bilaterally with scattered airspace disease in the mid to lower lung fields. Kerley B-lines are seen bilaterally. There is a small right pleural  effusion. No pneumothorax is seen. No acute osseous abnormality. IMPRESSION: 1. Findings compatible with pulmonary edema. 2. Small right pleural effusion. Electronically Signed   By: Thornell Sartorius M.D.   On: 05/13/2023 00:37    Cardiac Studies  Echo pending    Patient Profile     77 y.o. 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  Unfortunately there are no accurate input and output.  At this time I do think that the patient would benefit from another dose of Lasix Lasix 40 mg at this time.  Will continue to  monitor kidney function as well as blood pressure.  Blood pressure is marginal.  Echo is pending, other recommendations post echo.  She has risk factors for coronary artery disease, her symptoms are atypical but her shortness of breath though in the setting of likely heart failure exacerbation this has been progressing could be an anginal equivalent.  Troponins elevated as well we will monitor closely as this also could be type II MI.  For now I am going to repeat her troponin just to see if this is trending down.  In the meantime continue heparin drip.  Aspirin 81 mg will be started and will add low-dose statin.  Hyponatremia slightly increasing.  Sodium 121 this morning was 117 prior   For questions or updates, please contact [facility] Please consult [email or web address] for contact info under Cardiology/STEMI.      Osvaldo Shipper, DO  05/13/2023, 9:41 AM

## 2023-05-13 NOTE — ED Notes (Signed)
Pt admitted to [facility]

## 2023-05-13 NOTE — Consult Note (Signed)
Initial Consultation Note   Patient: Martha Swanson WGN:[id number] DOB: 1946/01/31 PCP: [name], MD DOA: 05/12/2023 DOS: the patient was seen and examined on 05/13/2023 Primary service: Nira Conn, * - ED   Referring physician: Nira Conn, * Reason for consult: NSTEMI   Assessment/Plan: Assessment and Plan:  77 year old female with history of Takotsubo cardiomyopathy, heart failure with reduced ejection fraction, hypertension, hyperlipidemia, hypothyroidism, who presented with progressive anginal symptoms since 11/24 at 5 PM, found to have non-STEMI with ongoing anginal symptoms, acute exacerbation of heart failure, and borderline hypotension felt to be cardiogenic in nature.  [facility] asked to be primary due to concurrent hyponatremia.  However due to her significant active cardiac disease feel that patient's better suited on cardiology service as primary and TRH will consult.  NSTEMI Acute exacerbation of heart failure with reduced ejection fraction Borderline hypotension, suspected cardiogenic Acute onset anginal symptoms 11/24 at 5 PM, progressive and woke her from sleep around 10 PM.  At time of consult continues to have ongoing anginal symptoms.  BP is borderline 90s over 60s. EKG with ST depressions inferolaterally with deep T wave inversions, minimal ST elevation septal leads not meeting criteria for STEMI.  High-sensitivity troponin is elevated at 1312 and uptrending to 1588.  -Cardiology consulted per above feel that patient better suited on cardiology service primary -Status post aspirin 325 mg x 1 load which she took at home -Continue heparin drip -Initially treated with fluids due to low pressures and appearing dry, but not have pulmonary edema and now status post Lasix 20 mg IV x 1 -continue to trend troponin to peak, asked for repeat EKG to evaluate for dynamic changes -Additional management and decision re: interventional strategy per  cardiology  Hyponatremia, improving now moderate range, presumed chronic, asymptomatic Na 117 -> 121. Etiology of hyponatremia unclear at this point, lab evaluation per below to further characterize.  Possibly SIADH related to acute illness, versus hypervolemic in setting of heart failure. - Ordered serum osmolality, urine osmolality, urine sodium to further characterize - When taking diet fluid restrict to 1.2 L daily initially.  - Continue diuresis per above - Trend sodium every 8 hours for now  - Goal correction approximately 6-8 meq in 24 hours; this would be 123-125.  - Please contact [facility] if any overcorrection or worsening sodium.  Chronic medical problems: Hypertension: Hold home lisinopril due to hypotension Hyperlipidemia: Not on cholesterol-lowering agents Hypothyroidism: Continue home levothyroxine.  TSH ordered  TRH will continue to follow the patient.  ========================================================  CC: Chest pain   HPI: HAIZEL Swanson is a 77 y.o. female with hx Takotsubo cardiomyopathy, heart failure with reduced ejection fraction, hypertension, hyperlipidemia, hypothyroidism, who presents with chest pain.  Reports that 2 days ago began feeling generally weak.  Then acutely on 11/24 around 5 PM developed dull left shoulder pain which was intermittent and occurred during exertion and relieved with rest.  She did not take nitroglycerin.  She went to sleep around 8:30 PM she woke around 10 PM due to more severe left-sided shoulder pain and chest pain rated 5-6 out of 10 at worst.  She had associated nausea but no vomiting.  No significant dyspnea, orthopnea, lower extremity edema.  No other recent illness.  She has not had any cardiac evaluation since her cath in 2012 with normal coronaries.   Review of Systems: As mentioned in the history of present illness. All other systems reviewed and are negative. Past Medical History:  Diagnosis Date  Heart disease     Takotsubo cardiomyopathy     cardiac catheterization initial ejection fraction 20% no coronary artery disease ejection fraction improved after 5 days    Unspecified hypothyroidism    Past Surgical History:  Procedure Laterality Date   ABDOMINAL HYSTERECTOMY     COLONOSCOPY     COLONOSCOPY N/A 04/01/2013   Procedure: COLONOSCOPY;  Surgeon: [name], MD;  Location: [facility];  Service: Endoscopy;  Laterality: N/A;  830   THYROIDECTOMY  1997   TONSILLECTOMY     Social History:  reports that she has never smoked. She has never used smokeless tobacco. She reports current alcohol use. She reports that she does not use drugs.  Allergies  Allergen Reactions   Penicillins Anaphylaxis    Family History  Problem Relation Age of Onset   Arthritis Mother    COPD Mother    Hypertension Mother    Heart attack Father 5   Early death Father    Breast cancer Maternal Aunt 40   Cancer Maternal Grandfather    Arthritis Brother    Asthma Brother    Hypertension Brother    Arthritis Brother    Colon cancer Neg Hx     Prior to Admission medications   Medication Sig Start Date End Date Taking? Authorizing Provider  calcium carbonate (OS-CAL) 600 MG TABS tablet Take 1 tablet (600 mg total) by mouth 2 (two) times daily with a meal. 12/05/17  Yes [name], MD  levothyroxine (SYNTHROID) 50 MCG tablet TAKE 1 TABLET BY MOUTH EVERY DAY BEFORE BREAKFAST 03/22/23  Yes [name], MD  lisinopril (ZESTRIL) 10 MG tablet TAKE 1 TABLET(10 MG) BY MOUTH DAILY 04/16/23  Yes [name], MD  Multiple Vitamin (MULTIVITAMIN WITH MINERALS) TABS tablet Take 1 tablet by mouth daily.   Yes [provider]    Physical Exam: Vitals:   05/13/23 0300 05/13/23 0313 05/13/23 0315 05/13/23 0330  BP: 95/61  97/68 95/70  Pulse: 83 98 90 96  Resp: (!) 26 15 18 17   Temp:      TempSrc:      SpO2: 93% 96% 92% 94%  Weight:      Height:        Gen: Awake, alert,  acutely ill appearing  CV: Regular, normal S1, S2, 2/6 holosystolic murmur radiating to axilla  Resp: Normal WOB, few rales in the bases. Laying mostly flat comfortably  Abd: Flat, normoactive, nontender MSK: Symmetric, there is no lower ext edema  Skin: No rashes or lesions to exposed skin  Neuro: Alert and interactive  Psych: euthymic, appropriate    Data Reviewed:   BNP 1165 High-sensitivity Trop 1312 -> 1588  NA 117 -> 121.  Blood glucose 124 Bicarb 20, anion gap 9 Mag 1.8 WBC 12, hemoglobin 11  Chest x-ray with pulmonary edema, small right pleural effusion  EKG sinus rhythm, borderline LVH, ST depression inferolaterally with deep T wave inversion, 0.5 mm STE V1, V2.  Family Communication: Yes discussed with her husband at bedside Primary team communication: Spoke with the EDP and advised that I would like patient admitted to cardiology with [facility] consulting.  Thank you very much for involving [place] in the care of your patient.  Author: Dolly Rias, MD 05/13/2023 4:00 AM  For on call review [email or web address].

## 2023-05-13 NOTE — ED Triage Notes (Signed)
Pt BIB [facility] from home, Pt had CP with pain radiating down to the left side of the arm that started around 6-7pm last night and gradually became worse this evening. Pt reported intermittent nausea, SOB, weakness, fatigue with the chest pain. Pain 3/10.   Pt took 325 mg of aspirin at home EMS gave 4 mg of zofran and 1 nitroglycerin sublingual en route.

## 2023-05-13 NOTE — ED Notes (Signed)
While placing second IV for pt, pt found to be 90-91% on room air. Has been placed on 2 L nasal cannula with improvement to 98%. Primary RN Darral Dash Informed.

## 2023-05-13 NOTE — ED Notes (Signed)
Cardiology MD at bedside.

## 2023-05-13 NOTE — ED Notes (Signed)
Provider made aware of critical sodium and trop.

## 2023-05-13 NOTE — Progress Notes (Signed)
PHARMACY - ANTICOAGULATION CONSULT NOTE  Pharmacy Consult for IV heparin Indication: chest pain/ACS  Allergies  Allergen Reactions   Penicillins Anaphylaxis    Patient Measurements: Height: 5' (152.4 cm) Weight: 43.5 kg (96 lb) IBW/kg (Calculated) : 45.5 Heparin Dosing Weight: 43.5 kg  Vital Signs: Temp: 97.9 F (36.6 C) (11/25 1001) Temp Source: Oral (11/25 1001) BP: 107/65 (11/25 1100) Pulse Rate: 85 (11/25 1100)  Labs: Recent Labs    05/13/23 0008 05/13/23 0110 05/13/23 0237 05/13/23 0920 05/13/23 1020  HGB 12.5 11.9* 11.9*  --   --   HCT 35.8* 35.0* 33.9*  --   --   PLT 315  --  272  --   --   HEPARINUNFRC  --   --   --   --  <0.10*  CREATININE 0.86 0.80  --  0.78  --   TROPONINIHS 1,312*  --  1,588*  --   --     Estimated Creatinine Clearance: 40.4 mL/min (by C-G formula based on SCr of 0.78 mg/dL).   Medical History: Past Medical History:  Diagnosis Date   Heart disease    Takotsubo cardiomyopathy     cardiac catheterization initial ejection fraction 20% no coronary artery disease ejection fraction improved after 5 days    Unspecified hypothyroidism     Assessment: Martha Swanson is a 77 y.o. year old female admitted on 05/12/2023 with concern for ACS. No anticoagulation prior to admission. Pharmacy consulted to dose heparin.  HL undetectable while on heparin 500u/hr, no pauses or issues per RN. No plans for cath lab currently based on cardiology notes. HgB 12.9 and PLTS 272.    Goal of Therapy:  Heparin level 0.3-0.7 units/ml Monitor platelets by anticoagulation protocol: Yes   Plan:  Will increase heparin gtt to 600u/hr (11u/kg to 14u/kg).  8 heparin level  Daily heparin level, CBC, and monitoring for bleeding F/u plans for cath and anticoagulation   Thank you for allowing pharmacy to participate in this patient's care.  Estill Batten, PharmD, BCCCP  05/13/2023,12:06 PM

## 2023-05-13 NOTE — ED Notes (Signed)
Pt urinated into bedpan

## 2023-05-13 NOTE — ED Notes (Signed)
ED TO INPATIENT HANDOFF REPORT  ED Nurse Name and Phone #: Morrie Sheldon RN [phone]  S Name/Age/Gender Martha Swanson 77 y.o. female Room/Bed: 042C/042C  Code Status   Code Status: Full Code  Home/SNF/Other Home Patient oriented to: self, place, time, and situation Is this baseline? Yes   Triage Complete: Triage complete  Chief Complaint Decompensated heart failure (HCC) [I50.9]  Triage Note Pt BIB [facility] from home, Pt had CP with pain radiating down to the left side of the arm that started around 6-7pm last night and gradually became worse this evening. Pt reported intermittent nausea, SOB, weakness, fatigue with the chest pain. Pain 3/10.   Pt took 325 mg of aspirin at home EMS gave 4 mg of zofran and 1 nitroglycerin sublingual en route.    Allergies Allergies  Allergen Reactions   Penicillins Anaphylaxis    Level of Care/Admitting Diagnosis ED Disposition     ED Disposition  Admit   Condition  --   Comment  Hospital Area: [facility] [[id number]]  Level of Care: Progressive [102]  Admit to Progressive based on following criteria: CARDIOVASCULAR & THORACIC of moderate stability with acute coronary syndrome symptoms/low risk myocardial infarction/hypertensive urgency/arrhythmias/heart failure potentially compromising stability and stable post cardiovascular intervention patients.  May admit patient to [facility] or [facility] if equivalent level of care is available:: No  Covid Evaluation: Asymptomatic - no recent exposure (last 10 days) testing not required  Diagnosis: Decompensated heart failure [facility]) [[id number]]  Admitting Physician: Waseca Desanctis [[id number]]  Attending Physician: [name] [[id number]]  Certification:: I certify this patient will need inpatient services for at least 2 midnights  Expected Medical Readiness: 05/15/2023          B Medical/Surgery History Past Medical History:  Diagnosis Date   Heart disease     Takotsubo cardiomyopathy     cardiac catheterization initial ejection fraction 20% no coronary artery disease ejection fraction improved after 5 days    Unspecified hypothyroidism    Past Surgical History:  Procedure Laterality Date   ABDOMINAL HYSTERECTOMY     COLONOSCOPY     COLONOSCOPY N/A 04/01/2013   Procedure: COLONOSCOPY;  Surgeon: [name], MD;  Location: [facility];  Service: Endoscopy;  Laterality: N/A;  830   THYROIDECTOMY  1997   TONSILLECTOMY       A IV Location/Drains/Wounds Patient Lines/Drains/Airways Status     Active Line/Drains/Airways     Name Placement date Placement time Site Days   Peripheral IV 05/13/23 18 G Left Antecubital 05/13/23  --  Antecubital  less than 1   Peripheral IV 05/13/23 18 G Right Forearm 05/13/23  0208  Forearm  less than 1            Intake/Output Last 24 hours  Intake/Output Summary (Last 24 hours) at 05/13/2023 1537 Last data filed at 05/13/2023 1500 Gross per 24 hour  Intake 30 ml  Output 1980 ml  Net -1950 ml    Labs/Imaging Results for orders placed or performed during the hospital encounter of 05/12/23 (from the past 48 hour(s))  Troponin I (High Sensitivity)     Status: Abnormal   Collection Time: 05/13/23 12:08 AM  Result Value Ref Range   Troponin I (High Sensitivity) 1,312 (HH) <18 ng/L    Comment: CRITICAL RESULT CALLED TO, READ BACK BY AND VERIFIED WITH Maia Plan RN @ [phone] 05/13/23 JBUTLER (NOTE) Elevated high sensitivity troponin I (hsTnI) values and significant  changes across serial  measurements may suggest ACS but many other  chronic and acute conditions are known to elevate hsTnI results.  Refer to the "Links" section for chest pain algorithms and additional  guidance. Performed at [facility], [address] [place], [place] [address]   Comprehensive metabolic panel     Status: Abnormal   Collection Time: 05/13/23 12:08 AM  Result Value Ref Range   Sodium 117 (LL) 135 - 145  mmol/L    Comment: CRITICAL RESULT CALLED TO, READ BACK BY AND VERIFIED WITH Maia Plan RN @ [phone] 05/13/23 JBUTLER   Potassium 4.1 3.5 - 5.1 mmol/L   Chloride 88 (L) 98 - 111 mmol/L   CO2 20 (L) 22 - 32 mmol/L   Glucose, Bld 138 (H) 70 - 99 mg/dL    Comment: Glucose reference range applies only to samples taken after fasting for at least 8 hours.   BUN 15 8 - 23 mg/dL   Creatinine, Ser 4.40 0.44 - 1.00 mg/dL   Calcium 8.4 (L) 8.9 - 10.3 mg/dL   Total Protein 5.3 (L) 6.5 - 8.1 g/dL   Albumin 3.2 (L) 3.5 - 5.0 g/dL   AST 28 15 - 41 U/L   ALT 10 0 - 44 U/L   Alkaline Phosphatase 42 38 - 126 U/L   Total Bilirubin 1.0 <1.2 mg/dL   GFR, Estimated >10 >27 mL/min    Comment: (NOTE) Calculated using the CKD-EPI Creatinine Equation (2021)    Anion gap 9 5 - 15    Comment: Performed at [facility], [address], [place], [place] [address]  Magnesium     Status: None   Collection Time: 05/13/23 12:08 AM  Result Value Ref Range   Magnesium 1.8 1.7 - 2.4 mg/dL    Comment: Performed at [facility], [address], [place], [place] [address]  Brain natriuretic peptide (order if patient c/o SOB ONLY)     Status: Abnormal   Collection Time: 05/13/23 12:08 AM  Result Value Ref Range   B Natriuretic Peptide 1,165.8 (H) 0.0 - 100.0 pg/mL    Comment: Performed at [facility], [address], [place], [place] [address]  CBC with Differential/Platelet     Status: Abnormal   Collection Time: 05/13/23 12:08 AM  Result Value Ref Range   WBC 12.3 (H) 4.0 - 10.5 K/uL   RBC 4.06 3.87 - 5.11 MIL/uL   Hemoglobin 12.5 12.0 - 15.0 g/dL   HCT 56.3 (L) 87.5 - 64.3 %   MCV 88.2 80.0 - 100.0 fL   MCH 30.8 26.0 - 34.0 pg   MCHC 34.9 30.0 - 36.0 g/dL   RDW 32.9 51.8 - 84.1 %   Platelets 315 150 - 400 K/uL   nRBC 0.0 0.0 - 0.2 %   Neutrophils Relative % 80 %   Neutro Abs 9.9 (H) 1.7 - 7.7 K/uL   Lymphocytes Relative 14 %   Lymphs Abs 1.7 0.7 - 4.0 K/uL   Monocytes Relative 5 %    Monocytes Absolute 0.6 0.1 - 1.0 K/uL   Eosinophils Relative 1 %   Eosinophils Absolute 0.1 0.0 - 0.5 K/uL   Basophils Relative 0 %   Basophils Absolute 0.0 0.0 - 0.1 K/uL   Immature Granulocytes 0 %   Abs Immature Granulocytes 0.04 0.00 - 0.07 K/uL    Comment: Performed at [facility], [address], [place], [place] [address]  I-stat chem 8, ED  Status: Abnormal   Collection Time: 05/13/23  1:10 AM  Result Value Ref Range   Sodium 121 (L) 135 - 145 mmol/L   Potassium 4.5 3.5 - 5.1 mmol/L   Chloride 87 (L) 98 - 111 mmol/L   BUN 16 8 - 23 mg/dL   Creatinine, Ser 0.98 0.44 - 1.00 mg/dL   Glucose, Bld 119 (H) 70 - 99 mg/dL    Comment: Glucose reference range applies only to samples taken after fasting for at least 8 hours.   Calcium, Ion 1.13 (L) 1.15 - 1.40 mmol/L   TCO2 23 22 - 32 mmol/L   Hemoglobin 11.9 (L) 12.0 - 15.0 g/dL   HCT 14.7 (L) 82.9 - 56.2 %  Troponin I (High Sensitivity)     Status: Abnormal   Collection Time: 05/13/23  2:37 AM  Result Value Ref Range   Troponin I (High Sensitivity) 1,588 (HH) <18 ng/L    Comment: CRITICAL VALUE NOTED. VALUE IS CONSISTENT WITH PREVIOUSLY REPORTED/CALLED VALUE (NOTE) Elevated high sensitivity troponin I (hsTnI) values and significant  changes across serial measurements may suggest ACS but many other  chronic and acute conditions are known to elevate hsTnI results.  Refer to the "Links" section for chest pain algorithms and additional  guidance. Performed at [facility], [address], [place], [place] [address]   CBC     Status: Abnormal   Collection Time: 05/13/23  2:37 AM  Result Value Ref Range   WBC 12.9 (H) 4.0 - 10.5 K/uL   RBC 3.92 3.87 - 5.11 MIL/uL   Hemoglobin 11.9 (L) 12.0 - 15.0 g/dL   HCT 57.8 (L) 46.9 - 62.9 %   MCV 86.5 80.0 - 100.0 fL   MCH 30.4 26.0 - 34.0 pg   MCHC 35.1 30.0 - 36.0 g/dL   RDW 52.8 41.3 - 24.4 %   Platelets 272 150 - 400 K/uL   nRBC 0.0 0.0 - 0.2 %    Comment: Performed  at [facility], [address], [place], [place] [address]  Osmolality     Status: Abnormal   Collection Time: 05/13/23  2:37 AM  Result Value Ref Range   Osmolality 258 (L) 275 - 295 mOsm/kg    Comment: Performed at [facility], [address] [place], [place] [address]  TSH     Status: None   Collection Time: 05/13/23  2:37 AM  Result Value Ref Range   TSH 2.423 0.350 - 4.500 uIU/mL    Comment: Performed by a 3rd Generation assay with a functional sensitivity of <=0.01 uIU/mL. Performed at [facility], [address], [place], [place] [address]   Hemoglobin A1c     Status: None   Collection Time: 05/13/23  2:37 AM  Result Value Ref Range   Hgb A1c MFr Bld 5.2 4.8 - 5.6 %    Comment: (NOTE) Pre diabetes:          5.7%-6.4%  Diabetes:              >6.4%  Glycemic control for   <7.0% adults with diabetes    Mean Plasma Glucose 102.54 mg/dL    Comment: Performed at [facility], [address], [place], [place] [address]  Urinalysis, w/ Reflex to Culture (Infection Suspected) -Urine, Clean Catch     Status: Abnormal   Collection Time: 05/13/23  3:30 AM  Result Value Ref Range   Specimen Source URINE, CLEAN CATCH  Color, Urine STRAW (A) YELLOW   APPearance CLEAR CLEAR   Specific Gravity, Urine 1.006 1.005 - 1.030   pH 5.0 5.0 - 8.0   Glucose, UA NEGATIVE NEGATIVE mg/dL   Hgb urine dipstick NEGATIVE NEGATIVE   Bilirubin Urine NEGATIVE NEGATIVE   Ketones, ur 5 (A) NEGATIVE mg/dL   Protein, ur NEGATIVE NEGATIVE mg/dL   Nitrite NEGATIVE NEGATIVE   Leukocytes,Ua NEGATIVE NEGATIVE   RBC / HPF 0-5 0 - 5 RBC/hpf   WBC, UA 0-5 0 - 5 WBC/hpf    Comment:        Reflex urine culture not performed if WBC <=10, OR if Squamous epithelial cells >5. If Squamous epithelial cells >5 suggest recollection.    Bacteria, UA NONE SEEN NONE SEEN   Squamous Epithelial / HPF 6-10 0 - 5 /HPF   Hyaline Casts, UA PRESENT     Comment: Performed at [facility]  [facility], [address], [place], [place] [address]  Osmolality, urine     Status: Abnormal   Collection Time: 05/13/23  3:30 AM  Result Value Ref Range   Osmolality, Ur 286 (L) 300 - 900 mOsm/kg    Comment: Performed at [facility], [address] [place], [place] [address]  Sodium, urine, random     Status: None   Collection Time: 05/13/23  3:30 AM  Result Value Ref Range   Sodium, Ur 55 mmol/L    Comment: Performed at [facility], [address], [place], [place] [address]  Basic metabolic panel     Status: Abnormal   Collection Time: 05/13/23  9:20 AM  Result Value Ref Range   Sodium 121 (L) 135 - 145 mmol/L   Potassium 4.3 3.5 - 5.1 mmol/L   Chloride 94 (L) 98 - 111 mmol/L   CO2 21 (L) 22 - 32 mmol/L   Glucose, Bld 111 (H) 70 - 99 mg/dL    Comment: Glucose reference range applies only to samples taken after fasting for at least 8 hours.   BUN 12 8 - 23 mg/dL   Creatinine, Ser 9.14 0.44 - 1.00 mg/dL   Calcium 8.3 (L) 8.9 - 10.3 mg/dL   GFR, Estimated >78 >29 mL/min    Comment: (NOTE) Calculated using the CKD-EPI Creatinine Equation (2021)    Anion gap 6 5 - 15    Comment: Performed at [facility], [address] [place], [place] [address]  Heparin level (unfractionated)     Status: Abnormal   Collection Time: 05/13/23 10:20 AM  Result Value Ref Range   Heparin Unfractionated <0.10 (L) 0.30 - 0.70 IU/mL    Comment: (NOTE) The clinical reportable range upper limit is being lowered to >1.10 to align with the FDA approved guidance for the current laboratory assay.  If heparin results are below expected values, and patient dosage has  been confirmed, suggest follow up testing of antithrombin III levels. Performed at [facility], [address] [place], [place] [address]    DG Chest Portable 1 View  Result Date: 05/13/2023 CLINICAL DATA:  Chest pain radiating down left side of arm. Intermittent nausea, shortness of breath, weakness, and fatigue.  EXAM: PORTABLE CHEST 1 VIEW COMPARISON:  02/05/2011. FINDINGS: The heart size and mediastinal contours are within normal limits. There is atherosclerotic calcification of the aorta. Interstitial prominence is noted bilaterally with scattered airspace disease in the mid to lower lung fields. Kerley B-lines are seen bilaterally. There is a small right  pleural effusion. No pneumothorax is seen. No acute osseous abnormality. IMPRESSION: 1. Findings compatible with pulmonary edema. 2. Small right pleural effusion. Electronically Signed   By: Thornell Sartorius M.D.   On: 05/13/2023 00:37    Pending Labs Unresulted Labs (From admission, onward)     Start     Ordered   05/14/23 0500  Heparin level (unfractionated)  Daily,   R      05/13/23 0218   05/14/23 0500  Basic metabolic panel  Daily,   R     Comments: As Scheduled for 5 days    05/13/23 1527   05/14/23 0500  Magnesium  Daily,   R      05/13/23 1527   05/13/23 2100  Heparin level (unfractionated)  Once-Timed,   TIMED        05/13/23 1209   05/13/23 1528  CBC  (heparin)  Once,   R       Comments: Baseline for heparin therapy IF NOT ALREADY DRAWN.  Notify MD if PLT < 100 K.    05/13/23 1527   05/13/23 0900  Basic metabolic panel  Now then every 8 hours,   R (with STAT, TIMED occurrences)      05/13/23 0427   05/13/23 0500  CBC  Daily,   R      05/13/23 0218   Signed and Held  Basic metabolic panel  Tomorrow morning,   R        Signed and Held   Signed and Held  CBC  Tomorrow morning,   R        Signed and Held            Vitals/Pain Today's Vitals   05/13/23 1345 05/13/23 1500 05/13/23 1509 05/13/23 1526  BP: (!) 92/57 105/67    Pulse: 98 100 100   Resp: 20 18 (!) 21   Temp:   97.8 F (36.6 C)   TempSrc:   Axillary   SpO2: 96% 96% 96%   Weight:      Height:      PainSc:    0-No pain    Isolation Precautions No active isolations  Medications Medications  aspirin chewable tablet 324 mg (0 mg Oral Hold 05/13/23 0100)   heparin ADULT infusion 100 units/mL (25000 units/248mL) (600 Units/hr Intravenous Rate/Dose Change 05/13/23 1244)  levothyroxine (SYNTHROID) tablet 50 mcg (0 mcg Oral Hold 05/13/23 1532)  sodium chloride flush (NS) 0.9 % injection 3 mL (0 mLs Intravenous Hold 05/13/23 1528)  sodium chloride flush (NS) 0.9 % injection 3 mL (has no administration in time range)  0.9 %  sodium chloride infusion (has no administration in time range)  acetaminophen (TYLENOL) tablet 650 mg (has no administration in time range)  ondansetron (ZOFRAN) injection 4 mg (has no administration in time range)  furosemide (LASIX) tablet 20 mg (has no administration in time range)  rosuvastatin (CRESTOR) tablet 5 mg (5 mg Oral Given 05/13/23 1009)  sodium chloride 0.9 % bolus 500 mL (0 mLs Intravenous Stopped 05/13/23 0758)  furosemide (LASIX) injection 20 mg (20 mg Intravenous Given 05/13/23 0219)  heparin bolus via infusion 2,500 Units (2,500 Units Intravenous Bolus from Bag 05/13/23 0235)  furosemide (LASIX) injection 40 mg (40 mg Intravenous Given 05/13/23 1004)  aspirin chewable tablet 81 mg (81 mg Oral Given 05/13/23 1009)    Mobility walks with person assist and device     Focused Assessments Cardiac Assessment Handoff:  Cardiac Rhythm: Normal sinus rhythm Lab Results  Component Value Date   CKTOTAL 173 02/13/2011   CKMB 17.0 (HH) 02/13/2011   TROPONINI 2.99 (HH) 02/13/2011   No results found for: "DDIMER" Does the Patient currently have chest pain? No    R Recommendations: See Admitting Provider Note  Report given to:   Additional Notes:

## 2023-05-13 NOTE — Progress Notes (Signed)
PHARMACY - ANTICOAGULATION CONSULT NOTE  Pharmacy Consult for IV heparin Indication: chest pain/ACS  Allergies  Allergen Reactions   Penicillins Anaphylaxis    Patient Measurements: Height: 5' (152.4 cm) Weight: 43.5 kg (96 lb) IBW/kg (Calculated) : 45.5 Heparin Dosing Weight: 43.5 kg  Vital Signs: Temp: 97.8 F (36.6 C) (11/25 0009) Temp Source: Oral (11/25 0009) BP: 102/56 (11/25 0200) Pulse Rate: 91 (11/25 0200)  Labs: Recent Labs    05/13/23 0008 05/13/23 0110  HGB 12.5 11.9*  HCT 35.8* 35.0*  PLT 315  --   CREATININE 0.86 0.80  TROPONINIHS 1,312*  --     Estimated Creatinine Clearance: 40.4 mL/min (by C-G formula based on SCr of 0.8 mg/dL).   Medical History: Past Medical History:  Diagnosis Date   Heart disease    Takotsubo cardiomyopathy     cardiac catheterization initial ejection fraction 20% no coronary artery disease ejection fraction improved after 5 days    Unspecified hypothyroidism     Assessment: Martha Swanson is a 77 y.o. year old female admitted on 05/12/2023 with concern for ACS. No anticoagulation prior to admission. Pharmacy consulted to dose heparin.   Goal of Therapy:  Heparin level 0.3-0.7 units/ml Monitor platelets by anticoagulation protocol: Yes   Plan:  Heparin 2500 units x 1 as bolus followed by heparin infusion at 500 units/hr 8 heparin level  Daily heparin level, CBC, and monitoring for bleeding F/u plans for cath and anticoagulation   Thank you for allowing pharmacy to participate in this patient's care.  Marja Kays, PharmD Emergency Medicine Clinical Pharmacist 05/13/2023,2:14 AM

## 2023-05-13 NOTE — Progress Notes (Signed)
PHARMACY - ANTICOAGULATION CONSULT NOTE  Pharmacy Consult for IV heparin Indication: chest pain/ACS  Allergies  Allergen Reactions   Penicillins Anaphylaxis    Patient Measurements: Height: 5' (152.4 cm) Weight: 45.8 kg (100 lb 14.4 oz) IBW/kg (Calculated) : 45.5 Heparin Dosing Weight: 43.5 kg  Vital Signs: Temp: 97.9 F (36.6 C) (11/25 1941) Temp Source: Oral (11/25 1941) BP: 93/58 (11/25 1941) Pulse Rate: 101 (11/25 1941)  Labs: Recent Labs    05/13/23 0008 05/13/23 0110 05/13/23 0237 05/13/23 0920 05/13/23 1020 05/13/23 2048 05/13/23 2049  HGB 12.5 11.9* 11.9*  --   --  11.7*  --   HCT 35.8* 35.0* 33.9*  --   --  33.2*  --   PLT 315  --  272  --   --  264  --   HEPARINUNFRC  --   --   --   --  <0.10*  --  <0.10*  CREATININE 0.86 0.80  --  0.78  --   --   --   TROPONINIHS 1,312*  --  1,588*  --   --   --   --     Estimated Creatinine Clearance: 42.3 mL/min (by C-G formula based on SCr of 0.78 mg/dL).   Medical History: Past Medical History:  Diagnosis Date   Heart disease    Takotsubo cardiomyopathy     cardiac catheterization initial ejection fraction 20% no coronary artery disease ejection fraction improved after 5 days    Unspecified hypothyroidism     Assessment: Martha Swanson is a 77 y.o. year old female admitted on 05/12/2023 with concern for ACS. No anticoagulation prior to admission. Pharmacy consulted to dose heparin.  HL undetectable while on heparin 500u/hr, no pauses or issues per RN. No plans for cath lab currently based on cardiology notes. HgB 12.9 and PLTS 272.   11/25 PM: heparin level remains undetectable after rate increase to 600 units/hr.  Hgb 11.7, pltc 264 - stable.   Goal of Therapy:  Heparin level 0.3-0.7 units/ml Monitor platelets by anticoagulation protocol: Yes   Plan:  Increase heparin infusion to 700 units/hr.  8 hour level  Daily heparin level, CBC, and monitoring for bleeding F/u plans for cath and  anticoagulation   Thank you for allowing pharmacy to participate in this patient's care.  Trixie Rude, PharmD Clinical Pharmacist 05/13/2023  9:19 PM

## 2023-05-13 NOTE — ED Provider Notes (Signed)
[place] EMERGENCY DEPARTMENT AT [facility] Provider Note  CSN: [id number] Arrival date & time: 05/12/23 2355  Chief Complaint(s) Chest Pain  HPI Martha Swanson is a 77 y.o. female with a past medical history listed below including Takotsubo arrest in 2012 with a clean cath here for several days of generalized fatigue, orthostatic dizziness and nausea who developed left upper chest/shoulder pain around 8:30 PM this evening just before going to bed.  She reports waking up around 10 with more severe pain that was worse with exertion.  She reports associated shortness of breath, nausea and clamminess.  She denies any recent fevers or infections.  No coughing or congestion.  No emesis.  No abdominal pain or diarrhea.  No urinary symptoms.  Patient reports being compliant with her medication.  Patient called 911 who instructed her to take 325 mg of aspirin which she did.  When EMS arrived, patient was given 1 dose of nitroglycerin which patient reports relieved her pain.  She is currently reporting soreness in her shoulder but no more pain.  The history is provided by the patient.    Past Medical History Past Medical History:  Diagnosis Date   Heart disease    Takotsubo cardiomyopathy     cardiac catheterization initial ejection fraction 20% no coronary artery disease ejection fraction improved after 5 days    Unspecified hypothyroidism    Patient Active Problem List   Diagnosis Date Noted   Hyponatremia 01/11/2023   Essential hypertension 02/28/2022   Elevated LDL cholesterol level 06/25/2019   Healthcare maintenance 06/25/2019   Heart murmur 03/07/2018   Vitamin D deficiency 12/05/2017   Age-related osteoporosis without current pathological fracture 09/04/2017   Takotsubo cardiomyopathy    Acquired hypothyroidism    Home Medication(s) Prior to Admission medications   Medication Sig Start Date End Date Taking? Authorizing Provider  calcium carbonate (OS-CAL) 600  MG TABS tablet Take 1 tablet (600 mg total) by mouth 2 (two) times daily with a meal. 12/05/17  Yes [name], MD  levothyroxine (SYNTHROID) 50 MCG tablet TAKE 1 TABLET BY MOUTH EVERY DAY BEFORE BREAKFAST 03/22/23  Yes [name], MD  lisinopril (ZESTRIL) 10 MG tablet TAKE 1 TABLET(10 MG) BY MOUTH DAILY 04/16/23  Yes [name], MD  Multiple Vitamin (MULTIVITAMIN WITH MINERALS) TABS tablet Take 1 tablet by mouth daily.   Yes [provider]                                                                                                                                    Allergies Penicillins  Review of Systems Review of Systems As noted in HPI  Physical Exam Vital Signs  I have reviewed the triage vital signs BP 95/70   Pulse 96   Temp 97.8 F (36.6 C) (Oral)   Resp 17   Ht 5' (1.524 m)   Wt 43.5 kg  SpO2 94%   BMI 18.75 kg/m   Physical Exam Vitals reviewed.  Constitutional:      General: She is not in acute distress.    Appearance: She is well-developed. She is not diaphoretic.  HENT:     Head: Normocephalic and atraumatic.     Nose: Nose normal.  Eyes:     General: No scleral icterus.       Right eye: No discharge.        Left eye: No discharge.     Conjunctiva/sclera: Conjunctivae normal.     Pupils: Pupils are equal, round, and reactive to light.  Cardiovascular:     Rate and Rhythm: Normal rate and regular rhythm.     Heart sounds: No murmur heard.    No friction rub. No gallop.  Pulmonary:     Effort: Pulmonary effort is normal. No respiratory distress.     Breath sounds: Normal breath sounds. No stridor. No rales.  Abdominal:     General: There is no distension.     Palpations: Abdomen is soft.     Tenderness: There is no abdominal tenderness.  Musculoskeletal:        General: No tenderness.     Cervical back: Normal range of motion and neck supple.  Skin:    General: Skin is warm and dry.     Findings: No  erythema or rash.  Neurological:     Mental Status: She is alert and oriented to person, place, and time.     ED Results and Treatments Labs (all labs ordered are listed, but only abnormal results are displayed) Labs Reviewed  COMPREHENSIVE METABOLIC PANEL - Abnormal; Notable for the following components:      Result Value   Sodium 117 (*)    Chloride 88 (*)    CO2 20 (*)    Glucose, Bld 138 (*)    Calcium 8.4 (*)    Total Protein 5.3 (*)    Albumin 3.2 (*)    All other components within normal limits  BRAIN NATRIURETIC PEPTIDE - Abnormal; Notable for the following components:   B Natriuretic Peptide 1,165.8 (*)    All other components within normal limits  CBC WITH DIFFERENTIAL/PLATELET - Abnormal; Notable for the following components:   WBC 12.3 (*)    HCT 35.8 (*)    Neutro Abs 9.9 (*)    All other components within normal limits  URINALYSIS, W/ REFLEX TO CULTURE (INFECTION SUSPECTED) - Abnormal; Notable for the following components:   Color, Urine STRAW (*)    Ketones, ur 5 (*)    All other components within normal limits  CBC - Abnormal; Notable for the following components:   WBC 12.9 (*)    Hemoglobin 11.9 (*)    HCT 33.9 (*)    All other components within normal limits  I-STAT CHEM 8, ED - Abnormal; Notable for the following components:   Sodium 121 (*)    Chloride 87 (*)    Glucose, Bld 124 (*)    Calcium, Ion 1.13 (*)    Hemoglobin 11.9 (*)    HCT 35.0 (*)    All other components within normal limits  TROPONIN I (HIGH SENSITIVITY) - Abnormal; Notable for the following components:   Troponin I (High Sensitivity) 1,312 (*)    All other components within normal limits  TROPONIN I (HIGH SENSITIVITY) - Abnormal; Notable for the following components:   Troponin I (High Sensitivity) 1,588 (*)    All other components within  normal limits  MAGNESIUM  HEPARIN LEVEL (UNFRACTIONATED)  OSMOLALITY  OSMOLALITY, URINE  SODIUM, URINE, RANDOM  TSH  BASIC METABOLIC  PANEL  BASIC METABOLIC PANEL  CBG MONITORING, ED                                                                                                                         EKG  EKG Interpretation Date/Time:  Monday May 13 2023 00:02:50 EST Ventricular Rate:  96 PR Interval:  143 QRS Duration:  87 QT Interval:  415 QTC Calculation: 525 R Axis:   82  Text Interpretation: Sinus rhythm Borderline right axis deviation Probable LVH with secondary repol abnrm Repol abnrm, global ischemia, diffuse leads Prolonged QT interval Similar TWI pattern from prior, however more pronounced Reconfirmed by Drema Pry [phone]) on 05/13/2023 2:31:27 AM       Radiology DG Chest Portable 1 View  Result Date: 05/13/2023 CLINICAL DATA:  Chest pain radiating down left side of arm. Intermittent nausea, shortness of breath, weakness, and fatigue. EXAM: PORTABLE CHEST 1 VIEW COMPARISON:  02/05/2011. FINDINGS: The heart size and mediastinal contours are within normal limits. There is atherosclerotic calcification of the aorta. Interstitial prominence is noted bilaterally with scattered airspace disease in the mid to lower lung fields. Kerley B-lines are seen bilaterally. There is a small right pleural effusion. No pneumothorax is seen. No acute osseous abnormality. IMPRESSION: 1. Findings compatible with pulmonary edema. 2. Small right pleural effusion. Electronically Signed   By: Thornell Sartorius M.D.   On: 05/13/2023 00:37    Medications Ordered in ED Medications  aspirin chewable tablet 324 mg (0 mg Oral Hold 05/13/23 0100)  heparin ADULT infusion 100 units/mL (25000 units/22mL) (500 Units/hr Intravenous New Bag/Given 05/13/23 0238)  sodium chloride 0.9 % bolus 500 mL (500 mLs Intravenous New Bag/Given 05/13/23 0101)  furosemide (LASIX) injection 20 mg (20 mg Intravenous Given 05/13/23 0219)  heparin bolus via infusion 2,500 Units (2,500 Units Intravenous Bolus from Bag 05/13/23 0235)   Procedures .1-3 Lead  EKG Interpretation  Performed by: Nira Conn, MD Authorized by: Nira Conn, MD     Interpretation: normal     ECG rate:  95   ECG rate assessment: normal     Rhythm: sinus rhythm     Ectopy: none     Conduction: normal   Ultrasound ED Echo  Date/Time: 05/13/2023 4:29 AM  Performed by: Nira Conn, MD Authorized by: Nira Conn, MD   Procedure details:    Indications: chest pain     Views: parasternal long axis view, parasternal short axis view and apical 4 chamber view     Images: archived   Findings:    Pericardium: no pericardial effusion     LV Function: severly depressed (<30%)     LV Function comment:  Apical ballooning   RV Diameter: normal   Impression:    Impression: decreased contractility   .Critical Care  Performed by: Nira Conn, MD  Authorized by: Nira Conn, MD   Critical care provider statement:    Critical care time (minutes):  53   Critical care time was exclusive of:  Separately billable procedures and treating other patients   Critical care was necessary to treat or prevent imminent or life-threatening deterioration of the following conditions:  Cardiac failure and metabolic crisis   Critical care was time spent personally by me on the following activities:  Development of treatment plan with patient or surrogate, discussions with consultants, evaluation of patient's response to treatment, examination of patient, obtaining history from patient or surrogate, review of old charts, re-evaluation of patient's condition, pulse oximetry, ordering and review of radiographic studies, ordering and review of laboratory studies and ordering and performing treatments and interventions   Care discussed with: admitting provider     (including critical care time) Medical Decision Making / ED Course   Medical Decision Making Amount and/or Complexity of Data Reviewed Labs: ordered. Decision-making  details documented in ED Course. Radiology: ordered and independent interpretation performed. Decision-making details documented in ED Course. ECG/medicine tests: ordered and independent interpretation performed. Decision-making details documented in ED Course.  Risk OTC drugs. Prescription drug management. Decision regarding hospitalization.    Left-sided chest pain x 4 hours.  Also with several days of dizziness and nausea.  Differential and workup listed below  Clinical Course as of 05/13/23 0429  Mon May 13, 2023  0205 Patient with hyponatremia. Patient appeared hypovolemic on exam prompting initial IVF. However now with findings suggesting of HF will need to assess for etiology of either hypervolemic vs euvolemic hypoNa+.  IVF stopped after patient received 300cc when CXR revealed pulmonary edema and BNP was suggestive of HF. Lasix ordered.   Trop elevated as well. NSTEMI vs recurrent Takotsubo. Hep gtt ordered.  Cardiology consulted.  [PC]  0227 POCUS favoring Takotsubo with LV apical ballooning. No pericardial effusion/tamponade noted. [PC]  55 Spoke with Dr. Laurelyn Sickle from cardiology, who will evaluate patient for management.  No emergent cath at this time. Requested medicine admission given hyponatremia.   [PC]  [phone] Hospitalist consulted  [PC]  0330 Spoke with Dr. Lazarus Salines from Hospitalist service who will evaluate patient and discuss with cardiology regarding admission. [PC]    Clinical Course User Index [PC] Emmaleah Meroney, Amadeo Garnet, MD    Rest of the workup without obvious infectious etiology concerning for sepsis.  No severe anemia requiring blood transfusion.  Final Clinical Impression(s) / ED Diagnoses Final diagnoses:  NSTEMI (non-ST elevated myocardial infarction) (HCC)  Acute heart failure, unspecified heart failure type (HCC)  Hyponatremia    This chart was dictated using voice recognition software.  Despite best efforts to proofread,  errors can occur  which can change the documentation meaning.    Nira Conn, MD 05/13/23 [phone]

## 2023-05-13 NOTE — H&P (Signed)
Cardiology Admission History and Physical   Patient ID: Martha Swanson MRN: [id number]; DOB: 11-22-1945   Admission date: 05/12/2023  PCP:  [name], MD   [facility] Providers Cardiologist:  None        Chief Complaint:  weakness  Patient Profile:   Martha Swanson is a 77 y.o. female with hx stress cardiomyopathy in 2012, who is being seen 05/13/2023 for the evaluation of weakness and shortness of breath.  History of Present Illness:   [name] was in her usual state of health until last week when she began feeling an insidious onset of weakness because of which she was not able to complete her routine tasks. She also started experiencing some shortness of breath and had some vague episodes of dull left shoulder pain. She denies orthopnea, PND, bendopnea, and has no LE edema. Her CP was intiially a 5/10 yesterday and improved to a 2/10 by today. It is non-exertional but improved with NTG. Her primary complaint is that she has not been able to sleep and feels weak and tired because of that.  She notably has a hx of stress cardiomyopathy in 2012 and she says her symptoms now are similar to those that she had at that time as well.    Past Medical History:  Diagnosis Date   Heart disease    Takotsubo cardiomyopathy     cardiac catheterization initial ejection fraction 20% no coronary artery disease ejection fraction improved after 5 days    Unspecified hypothyroidism     Past Surgical History:  Procedure Laterality Date   ABDOMINAL HYSTERECTOMY     COLONOSCOPY     COLONOSCOPY N/A 04/01/2013   Procedure: COLONOSCOPY;  Surgeon: [name], MD;  Location: [facility];  Service: Endoscopy;  Laterality: N/A;  830   THYROIDECTOMY  1997   TONSILLECTOMY       Medications Prior to Admission: Prior to Admission medications   Medication Sig Start Date End Date Taking? Authorizing Provider  calcium carbonate (OS-CAL) 600 MG TABS tablet Take 1  tablet (600 mg total) by mouth 2 (two) times daily with a meal. 12/05/17  Yes [name], MD  levothyroxine (SYNTHROID) 50 MCG tablet TAKE 1 TABLET BY MOUTH EVERY DAY BEFORE BREAKFAST 03/22/23  Yes [name], MD  lisinopril (ZESTRIL) 10 MG tablet TAKE 1 TABLET(10 MG) BY MOUTH DAILY 04/16/23  Yes [name], MD  Multiple Vitamin (MULTIVITAMIN WITH MINERALS) TABS tablet Take 1 tablet by mouth daily.   Yes [provider]     Allergies:    Allergies  Allergen Reactions   Penicillins Anaphylaxis    Social History:   Social History   Socioeconomic History   Marital status: Married    Spouse name: Not on file   Number of children: Not on file   Years of education: Not on file   Highest education level: Associate degree: academic program  Occupational History    Comment: Part-time pastor  Tobacco Use   Smoking status: Never   Smokeless tobacco: Never  Vaping Use   Vaping status: Never Used  Substance and Sexual Activity   Alcohol use: Yes    Comment: occasional wine   Drug use: No   Sexual activity: Not on file  Other Topics Concern   Not on file  Social History Narrative   Not on file   Social Determinants of Health   Financial Resource Strain: Low Risk  (01/09/2023)  Overall Financial Resource Strain (CARDIA)    Difficulty of Paying Living Expenses: Not very hard  Food Insecurity: No Food Insecurity (01/09/2023)   Hunger Vital Sign    Worried About Running Out of Food in the Last Year: Never true    Ran Out of Food in the Last Year: Never true  Transportation Needs: No Transportation Needs (01/09/2023)   PRAPARE - Administrator, Civil Service (Medical): No    Lack of Transportation (Non-Medical): No  Physical Activity: Insufficiently Active (01/09/2023)   Exercise Vital Sign    Days of Exercise per Week: 4 days    Minutes of Exercise per Session: 30 min  Stress: No Stress Concern Present (01/09/2023)   [employer] of Occupational Health - Occupational Stress Questionnaire    Feeling of Stress : Only a little  Social Connections: Socially Integrated (01/09/2023)   Social Connection and Isolation Panel [NHANES]    Frequency of Communication with Friends and Family: Twice a week    Frequency of Social Gatherings with Friends and Family: Once a week    Attends Religious Services: More than 4 times per year    Active Member of [employer] or Organizations: Yes    Attends Engineer, structural: More than 4 times per year    Marital Status: Married  Catering manager Violence: Not At Risk (01/07/2023)   Humiliation, Afraid, Rape, and Kick questionnaire    Fear of Current or Ex-Partner: No    Emotionally Abused: No    Physically Abused: No    Sexually Abused: No    Family History:  The patient's family history includes Arthritis in her brother, brother, and mother; Asthma in her brother; Breast cancer (age of onset: 57) in her maternal aunt; COPD in her mother; Cancer in her maternal grandfather; Early death in her father; Heart attack (age of onset: 73) in her father; Hypertension in her brother and mother. There is no history of Colon cancer.    ROS:  Please see the history of present illness. All other ROS reviewed and negative.     Physical Exam/Data:   Vitals:   05/13/23 0545 05/13/23 0600 05/13/23 0606 05/13/23 0615  BP: (!) 97/58 114/69  104/62  Pulse: 82 92  (!) 103  Resp: 18 (!) 21  (!) 21  Temp:   98.6 F (37 C)   TempSrc:   Oral   SpO2: 97% 97%  95%  Weight:      Height:       No intake or output data in the 24 hours ending 05/13/23 0642    05/13/2023   12:10 AM 01/10/2023    8:11 AM 01/07/2023   12:55 PM  Last 3 Weights  Weight (lbs) 96 lb 96 lb 3.2 oz 95 lb  Weight (kg) 43.545 kg 43.636 kg 43.092 kg     Body mass index is 18.75 kg/m.  General:  Well nourished, well developed, in no acute distress HEENT: normal Neck: no JVD Vascular: No carotid bruits; Distal  pulses 2+ bilaterally   Cardiac:  normal S1, S2; RRR; no murmur Lungs:  clear to auscultation bilaterally, no wheezing, rhonchi or rales  Abd: soft, nontender, no hepatomegaly  Ext: no edema Musculoskeletal:  No deformities, BUE and BLE strength normal and equal Skin: warm and dry  Neuro:  CNs 2-12 intact, no focal abnormalities noted Psych:  Normal affect    EKG:  The ECG that was done was personally reviewed and demonstrates SR at 89bpm  Relevant CV Studies: none  Laboratory Data:  High Sensitivity Troponin:   Recent Labs  Lab 05/13/23 0008 05/13/23 0237  TROPONINIHS 1,312* 1,588*      Chemistry Recent Labs  Lab 05/13/23 0008 05/13/23 0110  NA 117* 121*  K 4.1 4.5  CL 88* 87*  CO2 20*  --   GLUCOSE 138* 124*  BUN 15 16  CREATININE 0.86 0.80  CALCIUM 8.4*  --   MG 1.8  --   GFRNONAA >60  --   ANIONGAP 9  --     Recent Labs  Lab 05/13/23 0008  PROT 5.3*  ALBUMIN 3.2*  AST 28  ALT 10  ALKPHOS 42  BILITOT 1.0   Lipids No results for input(s): "CHOL", "TRIG", "HDL", "LABVLDL", "LDLCALC", "CHOLHDL" in the last 168 hours. Hematology Recent Labs  Lab 05/13/23 0008 05/13/23 0110 05/13/23 0237  WBC 12.3*  --  12.9*  RBC 4.06  --  3.92  HGB 12.5 11.9* 11.9*  HCT 35.8* 35.0* 33.9*  MCV 88.2  --  86.5  MCH 30.8  --  30.4  MCHC 34.9  --  35.1  RDW 12.3  --  12.2  PLT 315  --  272   Thyroid  Recent Labs  Lab 05/13/23 0237  TSH 2.423   BNP Recent Labs  Lab 05/13/23 0008  BNP 1,165.8*    DDimer No results for input(s): "DDIMER" in the last 168 hours.   Radiology/Studies:  DG Chest Portable 1 View  Result Date: 05/13/2023 CLINICAL DATA:  Chest pain radiating down left side of arm. Intermittent nausea, shortness of breath, weakness, and fatigue. EXAM: PORTABLE CHEST 1 VIEW COMPARISON:  02/05/2011. FINDINGS: The heart size and mediastinal contours are within normal limits. There is atherosclerotic calcification of the aorta. Interstitial  prominence is noted bilaterally with scattered airspace disease in the mid to lower lung fields. Kerley B-lines are seen bilaterally. There is a small right pleural effusion. No pneumothorax is seen. No acute osseous abnormality. IMPRESSION: 1. Findings compatible with pulmonary edema. 2. Small right pleural effusion. Electronically Signed   By: Thornell Sartorius M.D.   On: 05/13/2023 00:37     Assessment and Plan:   Weakness: likely 2/2 hyponatremia vs cardiomyopathy Hyponatremia: likely hypervolemic - per IM  Suspected Acute on Chronic HFrEF: - repeat TTE - consider Lasix after TTE results - Ensure electrolytes are replete  Chest Pain: vague. Trop elevation 2/2 demand in setting of volume overload - await TTE - consider LHC once volume statis improves - ASA 81mg  daily - heparin gtt   Risk Assessment/Risk Scores:    TIMI Risk Score for Unstable Angina or Non-ST Elevation MI:   The patient's TIMI risk score is  , which indicates a  % risk of all cause mortality, new or recurrent myocardial infarction or need for urgent revascularization in the next 14 days.  New York Heart Association (NYHA) Functional Class NYHA Class II    Code Status: Full Code  Severity of Illness: The appropriate patient status for this patient is INPATIENT. Inpatient status is judged to be reasonable and necessary in order to provide the required intensity of service to ensure the patient's safety. The patient's presenting symptoms, physical exam findings, and initial radiographic and laboratory data in the context of their chronic comorbidities is felt to place them at high risk for further clinical deterioration. Furthermore, it is not anticipated that the patient will be medically stable for discharge from the hospital within 2 midnights of admission.   *  I certify that at the point of admission it is my clinical judgment that the patient will require inpatient hospital care spanning beyond 2 midnights from  the point of admission due to high intensity of service, high risk for further deterioration and high frequency of surveillance required.*   For questions or updates, please contact [facility] Please consult [email or web address] for contact info under     Signed, [place] Desanctis, MD  05/13/2023 6:42 AM

## 2023-05-14 ENCOUNTER — Encounter (HOSPITAL_COMMUNITY): Payer: Self-pay | Admitting: Internal Medicine

## 2023-05-14 ENCOUNTER — Encounter (HOSPITAL_COMMUNITY)
Admission: EM | Disposition: A | Discharge status: Home / Self Care | Payer: Self-pay | Source: Home / Self Care | Attending: Cardiology

## 2023-05-14 ENCOUNTER — Inpatient Hospital Stay (HOSPITAL_COMMUNITY): Payer: Medicare Other

## 2023-05-14 DIAGNOSIS — I509 Heart failure, unspecified: Secondary | ICD-10-CM | POA: Diagnosis not present

## 2023-05-14 DIAGNOSIS — I428 Other cardiomyopathies: Secondary | ICD-10-CM | POA: Diagnosis not present

## 2023-05-14 HISTORY — PX: RIGHT/LEFT HEART CATH AND CORONARY ANGIOGRAPHY: CATH118266

## 2023-05-14 LAB — BASIC METABOLIC PANEL
Anion gap: 5 (ref 5–15)
Anion gap: 9 (ref 5–15)
BUN: 11 mg/dL (ref 8–23)
BUN: 11 mg/dL (ref 8–23)
CO2: 24 mmol/L (ref 22–32)
CO2: 23 mmol/L (ref 22–32)
Calcium: 8.2 mg/dL — ABNORMAL LOW (ref 8.9–10.3)
Calcium: 8.7 mg/dL — ABNORMAL LOW (ref 8.9–10.3)
Chloride: 95 mmol/L — ABNORMAL LOW (ref 98–111)
Chloride: 93 mmol/L — ABNORMAL LOW (ref 98–111)
Creatinine, Ser: 0.93 mg/dL (ref 0.44–1.00)
Creatinine, Ser: 0.92 mg/dL (ref 0.44–1.00)
GFR, Estimated: >60 mL/min (ref >60)
GFR, Estimated: >60 mL/min (ref >60)
Glucose, Bld: 103 mg/dL — ABNORMAL HIGH (ref 70–99)
Glucose, Bld: 106 mg/dL — ABNORMAL HIGH (ref 70–99)
Potassium: 4.3 mmol/L (ref 3.5–5.1)
Potassium: 4 mmol/L (ref 3.5–5.1)
Sodium: 124 mmol/L — ABNORMAL LOW (ref 135–145)
Sodium: 125 mmol/L — ABNORMAL LOW (ref 135–145)

## 2023-05-14 LAB — ECHOCARDIOGRAM COMPLETE
AV Mean grad: 36 mm[Hg]
AV Peak grad: 64 mm[Hg]
Ao pk vel: 4 m/s
Area-P 1/2: 5.75 cm2
Calc EF: 41.7 %
Height: 60 in
MV M vel: 6.94 m/s
MV Peak grad: 192.7 mm[Hg]
S' Lateral: 2.8 cm
Single Plane A2C EF: 44 %
Single Plane A4C EF: 34.8 %
Weight: 1569.68 [oz_av]

## 2023-05-14 LAB — POCT I-STAT EG7
Acid-base deficit: 2 mmol/L (ref 0.0–2.0)
Acid-base deficit: 1 mmol/L (ref 0.0–2.0)
Bicarbonate: 21.9 mmol/L (ref 20.0–28.0)
Bicarbonate: 22.7 mmol/L (ref 20.0–28.0)
Calcium, Ion: 1.21 mmol/L (ref 1.15–1.40)
Calcium, Ion: 1.2 mmol/L (ref 1.15–1.40)
HCT: 34 % — ABNORMAL LOW (ref 36.0–46.0)
HCT: 34 % — ABNORMAL LOW (ref 36.0–46.0)
Hemoglobin: 11.6 g/dL — ABNORMAL LOW (ref 12.0–15.0)
Hemoglobin: 11.6 g/dL — ABNORMAL LOW (ref 12.0–15.0)
O2 Saturation: 72 %
O2 Saturation: 65 %
Potassium: 4 mmol/L (ref 3.5–5.1)
Potassium: 4 mmol/L (ref 3.5–5.1)
Sodium: 125 mmol/L — ABNORMAL LOW (ref 135–145)
Sodium: 125 mmol/L — ABNORMAL LOW (ref 135–145)
TCO2: 23 mmol/L (ref 22–32)
TCO2: 24 mmol/L (ref 22–32)
pCO2, Ven: 33.2 mm[Hg] — ABNORMAL LOW (ref 44–60)
pCO2, Ven: 34.3 mm[Hg] — ABNORMAL LOW (ref 44–60)
pH, Ven: 7.428 (ref 7.25–7.43)
pH, Ven: 7.429 (ref 7.25–7.43)
pO2, Ven: 37 mm[Hg] (ref 32–45)
pO2, Ven: 33 mm[Hg] (ref 32–45)

## 2023-05-14 LAB — POCT I-STAT 7, (LYTES, BLD GAS, ICA,H+H)
Acid-base deficit: 3 mmol/L — ABNORMAL HIGH (ref 0.0–2.0)
Bicarbonate: 20.4 mmol/L (ref 20.0–28.0)
Calcium, Ion: 1.17 mmol/L (ref 1.15–1.40)
HCT: 33 % — ABNORMAL LOW (ref 36.0–46.0)
Hemoglobin: 11.2 g/dL — ABNORMAL LOW (ref 12.0–15.0)
O2 Saturation: 95 %
Potassium: 3.9 mmol/L (ref 3.5–5.1)
Sodium: 126 mmol/L — ABNORMAL LOW (ref 135–145)
TCO2: 21 mmol/L — ABNORMAL LOW (ref 22–32)
pCO2 arterial: 29.2 mm[Hg] — ABNORMAL LOW (ref 32–48)
pH, Arterial: 7.452 — ABNORMAL HIGH (ref 7.35–7.45)
pO2, Arterial: 73 mm[Hg] — ABNORMAL LOW (ref 83–108)

## 2023-05-14 LAB — CBC
HCT: 33.8 % — ABNORMAL LOW (ref 36.0–46.0)
Hemoglobin: 11.9 g/dL — ABNORMAL LOW (ref 12.0–15.0)
MCH: 30 pg (ref 26.0–34.0)
MCHC: 35.2 g/dL (ref 30.0–36.0)
MCV: 85.1 fL (ref 80.0–100.0)
Platelets: 250 10*3/uL (ref 150–400)
RBC: 3.97 MIL/uL (ref 3.87–5.11)
RDW: 12.2 % (ref 11.5–15.5)
WBC: 7.8 10*3/uL (ref 4.0–10.5)
nRBC: 0 % (ref 0.0–0.2)

## 2023-05-14 LAB — MAGNESIUM: Magnesium: 1.9 mg/dL (ref 1.7–2.4)

## 2023-05-14 LAB — HEPARIN LEVEL (UNFRACTIONATED): Heparin Unfractionated: 0.11 [IU]/mL — ABNORMAL LOW (ref 0.30–0.70)

## 2023-05-14 SURGERY — RIGHT/LEFT HEART CATH AND CORONARY ANGIOGRAPHY
Anesthesia: LOCAL

## 2023-05-14 MED ORDER — LIDOCAINE HCL (PF) 1 % IJ SOLN
INTRAMUSCULAR | Status: AC
  Filled 2023-05-14: qty 30

## 2023-05-14 MED ORDER — HEPARIN (PORCINE) IN NACL 1000-0.9 UT/500ML-% IV SOLN
INTRAVENOUS | Status: DC | PRN
  Administered 2023-05-14 (×2): 500 mL

## 2023-05-14 MED ORDER — HEPARIN SODIUM (PORCINE) 1000 UNIT/ML IJ SOLN
INTRAMUSCULAR | Status: AC
  Filled 2023-05-14: qty 10

## 2023-05-14 MED ORDER — HEPARIN SODIUM (PORCINE) 5000 UNIT/ML IJ SOLN
5000.0000 [IU] | Freq: Three times a day (TID) | INTRAMUSCULAR | Status: DC
  Administered 2023-05-14 – 2023-05-15 (×2): 5000 [IU] via SUBCUTANEOUS
  Filled 2023-05-14 (×2): qty 1

## 2023-05-14 MED ORDER — FENTANYL CITRATE (PF) 100 MCG/2ML IJ SOLN
INTRAMUSCULAR | Status: DC | PRN
  Administered 2023-05-14 (×2): 25 ug via INTRAVENOUS

## 2023-05-14 MED ORDER — VERAPAMIL HCL 2.5 MG/ML IV SOLN
INTRAVENOUS | Status: AC
  Filled 2023-05-14: qty 2

## 2023-05-14 MED ORDER — MIDAZOLAM HCL 2 MG/2ML IJ SOLN
INTRAMUSCULAR | Status: DC | PRN
  Administered 2023-05-14 (×2): 1 mg via INTRAVENOUS

## 2023-05-14 MED ORDER — VERAPAMIL HCL 2.5 MG/ML IV SOLN
INTRAVENOUS | Status: DC | PRN
  Administered 2023-05-14: 5 mL via INTRA_ARTERIAL

## 2023-05-14 MED ORDER — MIDAZOLAM HCL 2 MG/2ML IJ SOLN
INTRAMUSCULAR | Status: AC
Start: 2023-05-14 — End: ?
  Filled 2023-05-14: qty 2

## 2023-05-14 MED ORDER — FENTANYL CITRATE (PF) 100 MCG/2ML IJ SOLN
INTRAMUSCULAR | Status: AC
  Filled 2023-05-14: qty 2

## 2023-05-14 MED ORDER — IOHEXOL 350 MG/ML SOLN
INTRAVENOUS | Status: DC | PRN
  Administered 2023-05-14: 53 mL

## 2023-05-14 MED ORDER — HEPARIN SODIUM (PORCINE) 1000 UNIT/ML IJ SOLN
INTRAMUSCULAR | Status: DC | PRN
  Administered 2023-05-14: 4000 [IU] via INTRAVENOUS

## 2023-05-14 MED ORDER — SODIUM CHLORIDE 0.9% FLUSH
3.0000 mL | Freq: Two times a day (BID) | INTRAVENOUS | Status: DC
  Administered 2023-05-14 – 2023-05-15 (×2): 3 mL via INTRAVENOUS

## 2023-05-14 MED ORDER — LIDOCAINE HCL (PF) 1 % IJ SOLN
INTRAMUSCULAR | Status: DC | PRN
  Administered 2023-05-14 (×2): 2 mL

## 2023-05-14 SURGICAL SUPPLY — 11 items
CATH BALLN WEDGE 5F 110CM (CATHETERS) IMPLANT
CATH INFINITI AMBI 5FR TG (CATHETERS) IMPLANT
DEVICE RAD TR BAND REGULAR (VASCULAR PRODUCTS) IMPLANT
GLIDESHEATH SLEND A-KIT 6F 22G (SHEATH) IMPLANT
GUIDEWIRE .025 260CM (WIRE) IMPLANT
GUIDEWIRE INQWIRE 1.5J.035X260 (WIRE) IMPLANT
INQWIRE 1.5J .035X260CM (WIRE) ×1
PACK CARDIAC CATHETERIZATION (CUSTOM PROCEDURE TRAY) ×2 IMPLANT
SET ATX-X65L (MISCELLANEOUS) IMPLANT
SHEATH GLIDE SLENDER 4/5FR (SHEATH) IMPLANT
SHEATH PROBE COVER 6X72 (BAG) IMPLANT

## 2023-05-14 NOTE — Interval H&P Note (Signed)
History and Physical Interval Note:  05/14/2023 9:12 AM  Martha Swanson  has presented today for surgery, with the diagnosis of unstable angina.  The various methods of treatment have been discussed with the patient and family. After consideration of risks, benefits and other options for treatment, the patient has consented to  Procedure(s): LEFT HEART CATH AND CORONARY ANGIOGRAPHY (N/A) and possible coronary angioplasty as a surgical intervention.  The patient's history has been reviewed, patient examined, no change in status, stable for surgery.  I have reviewed the patient's chart and labs.  Questions were answered to the patient's satisfaction.   Cath Lab Visit (complete for each Cath Lab visit)  Clinical Evaluation Leading to the Procedure:   ACS: Yes.    Non-ACS:    Anginal Classification: CCS IV  Anti-ischemic medical therapy: No Therapy  Non-Invasive Test Results: No non-invasive testing performed  Prior CABG: No previous CABG   [name]

## 2023-05-14 NOTE — Evaluation (Signed)
Physical Therapy Evaluation Patient Details Name: Martha Swanson MRN: [id number] DOB: [birth year] Today's Date: 05/14/2023  History of Present Illness  Patient is 77 y.o. female presented to [facility] with SOB and chest pain with troponin elevation suspected type II MI in the setting of volume overload, CXR revealed pulmonary edema. Pt underwent LHC on 05/14/23. Echo revealed moderately depressed ejection fraction with wall motion abnormalities concerning for anterior wall infarction.   Clinical Impression  Martha Swanson is 77 y.o. female admitted with above HPI and diagnosis. Patient is currently limited by functional impairments below (see PT problem list). Patient lives with spouse and is independent with no device at baseline. Currently mobilizing at Mod Ind for bed mod/transfers and CGA for gait with UE support on IV pole to steady self. Patient will benefit from continued skilled PT interventions to address impairments and progress independence with mobility, recommending OPPT follow up. Acute PT will follow and progress as able.         If plan is discharge home, recommend the following: Assist for transportation   Can travel by private vehicle        Equipment Recommendations None recommended by PT  Recommendations for Other Services       Functional Status Assessment Patient has had a recent decline in their functional status and demonstrates the ability to make significant improvements in function in a reasonable and predictable amount of time.     Precautions / Restrictions Precautions Precautions: Fall Restrictions Weight Bearing Restrictions: No      Mobility  Bed Mobility Overal bed mobility: Modified Independent Bed Mobility: Supine to Sit, Sit to Supine     Supine to sit: Modified independent (Device/Increase time), HOB elevated, Used rails Sit to supine: Modified independent (Device/Increase time), HOB elevated, Used rails   General bed mobility comments: use  of bed features    Transfers Overall transfer level: Needs assistance Equipment used: None Transfers: Sit to/from Stand Sit to Stand: Supervision           General transfer comment: sup for safety, use of hands to rise and lower from EOB    Ambulation/Gait Ambulation/Gait assistance: Contact guard assist Gait Distance (Feet): 100 Feet Assistive device: IV Pole Gait Pattern/deviations: Step-through pattern, Decreased stride length, Drifts right/left Gait velocity: fair     General Gait Details: overall steady gait pattern, no LOB noted. HR elevated to max of 129 bpm. 1x mild LOB/sway but pt able to self correct.  Stairs            Wheelchair Mobility     Tilt Bed    Modified Rankin (Stroke Patients Only)       Balance                                             Pertinent Vitals/Pain Pain Assessment Pain Assessment: No/denies pain    Home Living Family/patient expects to be discharged to:: Private residence Living Arrangements: Spouse/significant other   Type of Home: House Home Access: Stairs to enter Entrance Stairs-Rails: Lawyer of Steps: 1+1+5   Home Layout: One level Home Equipment: None      Prior Function Prior Level of Function : Independent/Modified Independent;Working/employed;Driving                     Extremity/Trunk Assessment   Upper Extremity Assessment Upper Extremity Assessment:  Overall [facility] for tasks assessed    Lower Extremity Assessment Lower Extremity Assessment: Overall WFL for tasks assessed    Cervical / Trunk Assessment Cervical / Trunk Assessment: Normal  Communication   Communication Communication: No apparent difficulties  Cognition Arousal: Alert Behavior During Therapy: WFL for tasks assessed/performed Overall Cognitive Status: Within Functional Limits for tasks assessed                                          General Comments       Exercises     Assessment/Plan    PT Assessment Patient needs continued PT services  PT Problem List Decreased strength;Decreased range of motion;Decreased activity tolerance;Decreased balance;Decreased mobility;Decreased knowledge of use of DME;Decreased safety awareness       PT Treatment Interventions DME instruction;Gait training;Stair training;Functional mobility training;Therapeutic activities;Therapeutic exercise;Balance training;Neuromuscular re-education;Cognitive remediation;Patient/family education    PT Goals (Current goals can be found in the Care Plan section)  Acute Rehab PT Goals PT Goal Formulation: With patient Time For Goal Achievement: 05/21/23 Potential to Achieve Goals: Good    Frequency Min 1X/week     Co-evaluation               AM-PAC PT "6 Clicks" Mobility  Outcome Measure Help needed turning from your back to your side while in a flat bed without using bedrails?: None Help needed moving from lying on your back to sitting on the side of a flat bed without using bedrails?: None Help needed moving to and from a bed to a chair (including a wheelchair)?: A Little Help needed standing up from a chair using your arms (e.g., wheelchair or bedside chair)?: A Little Help needed to walk in hospital room?: A Little Help needed climbing 3-5 steps with a railing? : A Little 6 Click Score: 20    End of Session Equipment Utilized During Treatment: Gait belt Activity Tolerance: Patient tolerated treatment well Patient left: with call bell/phone within reach;in bed;with family/visitor present Nurse Communication: Mobility status PT Visit Diagnosis: Unsteadiness on feet (R26.81);Other abnormalities of gait and mobility (R26.89);Muscle weakness (generalized) (M62.81);Difficulty in walking, not elsewhere classified (R26.2);Other symptoms and signs involving the nervous system (R29.898)    Time: 2956-2130 PT Time Calculation (min) (ACUTE ONLY): 17  min   Charges:   PT Evaluation $PT Eval Low Complexity: 1 Low   PT General Charges $$ ACUTE PT VISIT: 1 Visit         Wynn Maudlin, DPT Acute Rehabilitation Services Office [phone]  05/14/23 3:34 PM

## 2023-05-14 NOTE — Progress Notes (Signed)
Heart Failure Nurse Navigator Progress Note  PCP: [name], MD PCP-Cardiologist: [facility] Admission Diagnosis: NSTEMI, Acute heart failure, hyponatremia. Admitted from: Home via EMS  Presentation:   Martha Swanson presented with chest pain radiating down left side of her arm, that became worse, some nausea, shortness of breath, fatigue, took aspirin at home, then EMS gave 1 nitroglycerin sublingual. BP 95/70, HR 96, BNP 1,165, Troponin 1,588, CXR pulmonary edema, small right pleural effusion. Left heart cath planned for 05/14/2023.   Patient and her family were educated on the sign and symptoms of heart failure, daily weights, when to call her doctor or go to the ED, Diet/ fluid restrictions, taking all medications as prescribed and attending all medical appointments. Patient verbalized her understanding , a HF TOC Appointment was scheduled for 05/29/2023 @ 11 am.   ECHO/ LVEF: 40-45% New  Clinical Course:  Past Medical History:  Diagnosis Date   Heart disease    Takotsubo cardiomyopathy     cardiac catheterization initial ejection fraction 20% no coronary artery disease ejection fraction improved after 5 days    Unspecified hypothyroidism      Social History   Socioeconomic History   Marital status: Married    Spouse name: Not on file   Number of children: Not on file   Years of education: Not on file   Highest education level: Associate degree: academic program  Occupational History    Comment: Part-time pastor  Tobacco Use   Smoking status: Never   Smokeless tobacco: Never  Vaping Use   Vaping status: Never Used  Substance and Sexual Activity   Alcohol use: Yes    Comment: occasional wine   Drug use: No   Sexual activity: Not on file  Other Topics Concern   Not on file  Social History Narrative   Not on file   Social Determinants of Health   Financial Resource Strain: Low Risk  (01/09/2023)   Overall Financial Resource Strain (CARDIA)    Difficulty  of Paying Living Expenses: Not very hard  Food Insecurity: No Food Insecurity (05/13/2023)   Hunger Vital Sign    Worried About Running Out of Food in the Last Year: Never true    Ran Out of Food in the Last Year: Never true  Transportation Needs: No Transportation Needs (05/13/2023)   PRAPARE - Administrator, Civil Service (Medical): No    Lack of Transportation (Non-Medical): No  Physical Activity: Insufficiently Active (01/09/2023)   Exercise Vital Sign    Days of Exercise per Week: 4 days    Minutes of Exercise per Session: 30 min  Stress: No Stress Concern Present (01/09/2023)   [employer] of Occupational Health - Occupational Stress Questionnaire    Feeling of Stress : Only a little  Social Connections: Socially Integrated (01/09/2023)   Social Connection and Isolation Panel [NHANES]    Frequency of Communication with Friends and Family: Twice a week    Frequency of Social Gatherings with Friends and Family: Once a week    Attends Religious Services: More than 4 times per year    Active Member of [employer] or Organizations: Yes    Attends Engineer, structural: More than 4 times per year    Marital Status: Married   Water engineer and Provision:  Detailed education and instructions provided on heart failure disease management including the following:  Signs and symptoms of Heart Failure When to call the physician Importance of daily weights Low sodium diet  Fluid restriction Medication management Anticipated future follow-up appointments  Patient education given on each of the above topics.  Patient acknowledges understanding via teach back method and acceptance of all instructions.  Education Materials:  "Living Better With Heart Failure" Booklet, HF zone tool, & Daily Weight Tracker Tool.  Patient has scale at home: Yes Patient has pill box at home: Yes    High Risk Criteria for Readmission and/or Poor Patient Outcomes: Heart failure  hospital admissions (last 6 months): 0  No Show rate: 0 Difficult social situation: No, lives with husband Demonstrates medication adherence: yes Primary Language: English Literacy level: Reading, writing and comprehension  Barriers of Care:   New CHF Diet/ fluid restrictions/ daily weights  Considerations/Referrals:   Referral made to Heart Failure Pharmacist Stewardship: yes Referral made to Heart Failure CSW/NCM TOC: No Referral made to Heart & Vascular TOC clinic: Yes, 05/29/2023 @ 11 am.   Items for Follow-up on DC/TOC: Continued New HF education Diet/ fluid restrictions Daily weights   Rhae Hammock, BSN, RN Heart Failure Teacher, adult education Only

## 2023-05-14 NOTE — Progress Notes (Signed)
Progress Note  Patient Name: Martha Swanson Date of Encounter: 05/14/2023  Primary Cardiologist: None   Subjective   Patient seen and examined at her bedside. Her husband was in the room when I arrived. Still with some shortness of breath. No chest pain  Inpatient Medications    Scheduled Meds:  aspirin  324 mg Oral Once   furosemide  20 mg Oral BID   levothyroxine  50 mcg Oral Q0600   rosuvastatin  5 mg Oral Daily   sodium chloride flush  10 mL Intravenous Q12H   sodium chloride flush  3 mL Intravenous Q12H   Continuous Infusions:  sodium chloride     heparin 800 Units/hr (05/14/23 0654)   PRN Meds: sodium chloride, acetaminophen, morphine injection, nitroGLYCERIN, ondansetron (ZOFRAN) IV, mouth rinse, sodium chloride flush   Vital Signs    Vitals:   05/14/23 0017 05/14/23 0508 05/14/23 0547 05/14/23 0749  BP: 97/62 (!) 105/55  (!) 104/59  Pulse: 95 94  91  Resp: 18 18  18   Temp: 98.2 F (36.8 C) 98.2 F (36.8 C)  98.5 F (36.9 C)  TempSrc: Oral Oral  Oral  SpO2: 94% 95%  97%  Weight:   44.5 kg   Height:        Intake/Output Summary (Last 24 hours) at 05/14/2023 0859 Last data filed at 05/14/2023 0654 Gross per 24 hour  Intake 343.29 ml  Output 2880 ml  Net -2536.71 ml   Filed Weights   05/13/23 0010 05/13/23 1721 05/14/23 0547  Weight: 43.5 kg 45.8 kg 44.5 kg    Telemetry    Sinus rhythm - Personally Reviewed  ECG    Sinus rhythm, Hr 89 bpm with noted LVH - Personally Reviewed  Physical Exam    General: Comfortable Head: Atraumatic, normal size  Eyes: PEERLA, EOMI  Neck: Supple, normal JVD Cardiac: Normal S1, S2; RRR; no murmurs, rubs, or gallops Lungs: Clear to auscultation bilaterally Abd: Soft, nontender, no hepatomegaly  Ext: warm, no edema Musculoskeletal: No deformities, BUE and BLE strength normal and equal Skin: Warm and dry, no rashes   Neuro: Alert and oriented to person, place, time, and situation, CNII-XII grossly  intact, no focal deficits  Psych: Normal mood and affect   Labs    Chemistry Recent Labs  Lab 05/13/23 0008 05/13/23 0110 05/13/23 0920 05/13/23 2049 05/14/23 0247  NA 117*   < > 121* 119* 124*  K 4.1   < > 4.3 4.0 4.3  CL 88*   < > 94* 91* 95*  CO2 20*  --  21* 22 24  GLUCOSE 138*   < > 111* 108* 103*  BUN 15   < > 12 16 11   CREATININE 0.86   < > 0.78 0.85 0.93  CALCIUM 8.4*  --  8.3* 8.3* 8.2*  PROT 5.3*  --   --   --   --   ALBUMIN 3.2*  --   --   --   --   AST 28  --   --   --   --   ALT 10  --   --   --   --   ALKPHOS 42  --   --   --   --   BILITOT 1.0  --   --   --   --   GFRNONAA >60  --  >60 >60 >60  ANIONGAP 9  --  6 6 5    < > = values in  this interval not displayed.     Hematology Recent Labs  Lab 05/13/23 0237 05/13/23 2048 05/14/23 0247  WBC 12.9* 7.9 7.8  RBC 3.92 3.91 3.97  HGB 11.9* 11.7* 11.9*  HCT 33.9* 33.2* 33.8*  MCV 86.5 84.9 85.1  MCH 30.4 29.9 30.0  MCHC 35.1 35.2 35.2  RDW 12.2 12.3 12.2  PLT 272 264 250    Cardiac EnzymesNo results for input(s): "TROPONINI" in the last 168 hours. No results for input(s): "TROPIPOC" in the last 168 hours.   BNP Recent Labs  Lab 05/13/23 0008  BNP 1,165.8*     DDimer No results for input(s): "DDIMER" in the last 168 hours.   Radiology    DG Chest Portable 1 View  Result Date: 05/13/2023 CLINICAL DATA:  Chest pain radiating down left side of arm. Intermittent nausea, shortness of breath, weakness, and fatigue. EXAM: PORTABLE CHEST 1 VIEW COMPARISON:  02/05/2011. FINDINGS: The heart size and mediastinal contours are within normal limits. There is atherosclerotic calcification of the aorta. Interstitial prominence is noted bilaterally with scattered airspace disease in the mid to lower lung fields. Kerley B-lines are seen bilaterally. There is a small right pleural effusion. No pneumothorax is seen. No acute osseous abnormality. IMPRESSION: 1. Findings compatible with pulmonary edema. 2. Small  right pleural effusion. Electronically Signed   By: Thornell Sartorius M.D.   On: 05/13/2023 00:37    Cardiac Studies  Echo pending    Patient Profile     77 y.o. 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 Echo done this morning my review moderately depressed ejection fraction with wall motion abnormalities concerning for anterior wall infarction.  She is already on the schedule for LHC today. Please continue the heparin gtt.  Informed Consent   Shared Decision Making/Informed Consent The risks [stroke (1 in 1000), death (1 in 1000), kidney failure [usually temporary] (1 in 500), bleeding (1 in 200), allergic reaction [possibly serious] (1 in 200)], benefits (diagnostic support and management of coronary artery disease) and alternatives of a cardiac catheterization were discussed in detail with Martha Swanson and she is willing to proceed.  Continue Aspirin and statin.     For questions or updates, please contact [facility] Please consult [email or web address] for contact info under Cardiology/STEMI.      Signed, [name], DO  05/14/2023, 8:59 AM

## 2023-05-14 NOTE — Progress Notes (Signed)
  Echocardiogram 2D Echocardiogram has been performed.  Martha Swanson Martha Swanson 05/14/2023, 8:45 AM

## 2023-05-14 NOTE — Progress Notes (Signed)
PHARMACY - ANTICOAGULATION CONSULT NOTE  Pharmacy Consult for heparin Indication: chest pain/ACS  Labs: Recent Labs    05/13/23 0008 05/13/23 0110 05/13/23 0237 05/13/23 0920 05/13/23 1020 05/13/23 2048 05/13/23 2049 05/14/23 0247  HGB 12.5   < > 11.9*  --   --  11.7*  --  11.9*  HCT 35.8*   < > 33.9*  --   --  33.2*  --  33.8*  PLT 315  --  272  --   --  264  --  250  HEPARINUNFRC  --   --   --   --  <0.10*  --  <0.10* 0.11*  CREATININE 0.86   < >  --  0.78  --   --  0.85 0.93  TROPONINIHS 1,312*  --  1,588*  --   --   --   --   --    < > = values in this interval not displayed.   Assessment: 77yo female subtherapeutic on heparin after rate change though lab was drawn early; no infusion issues or signs of bleeding per RN.  Goal of Therapy:  Heparin level 0.3-0.7 units/ml   Plan:  Increase heparin infusion conservatively by 2 units/kg/hr to 800 units/hr. Check level in 8 hours.   Vernard Gambles, PharmD, BCPS 05/14/2023 4:27 AM

## 2023-05-14 NOTE — Progress Notes (Signed)
PROGRESS NOTE  Martha Swanson  DOB: 10-May-1946  PCP: [name], MD UXN:[id number]  DOA: 05/12/2023  LOS: 1 day  Hospital Day: 3  Brief narrative: Martha Swanson is a 77 y.o. female with PMH significant for Takotsubo cardiomyopathy 2012, HTN, HLD, hypothyroidism. 11/24, patient was brought to the ED from home by EMS after an episode of chest pain radiating to the left arm. Patient took aspirin at home and EMS gave Zofran sublingual nitroglycerin.   Of note, patient also reports that for the last 1 week, he has been feeling weak and unable to complete her routine tasks.  She also developed dyspnea and some vague episodes of left shoulder pain.  She has not been able to sleep well and feels tired all the time. She feels that her symptoms are similar to what when she had Takotsubo cardiomyopathy in 2012  In the ED, patient was afebrile, heart rate 90s, blood pressure in 90s, breathing on room air Initial labs with sodium low at 117, BNP elevated to 1100, troponin elevated to 1300, WBC count 12.3 Urinalysis clear.  Urine osmolality low at 286, urine sodium at 55 Chest x-ray showed pulm edema Patient was admitted to cardiology service.  Heparin GTT was started Hospitalist service consulted for medical management  Subjective: Patient was seen and examined this morning.   Not alert.  Not in distress.  Husband at bedside.  Underwent echocardiogram earlier.  Planned for cardiac cath.  Assessment and plan: Suspected acute exacerbation of CHF H/o Takotsubo cardiomyopathy 2012 Hypertension Patient feels that her symptoms are similar to last episode of CHF.  Echocardiogram this morning with low EF and anterior wall hypokinesis -large infarct versus Takotsubo Plan for cardiac cath this morning. Diuresis per cardiology.  NSTEMI HLD Also reports chest pain. Elevated troponin could be due to LV strain but also need to rule out type I MI since troponin is significantly  elevated Currently heparin drip, aspirin and Crestor.  Hyponatremia Present with significantly low sodium of 117 and osmolality of 258 Urine osmolality also low at 286 which rules out SIADH.  Per patient's husband, patient takes very little amount of salt daily. Hyponatremia likely due to combination of low salt intake and CHF Gradually improving sodium level in BMP.  Repeat tomorrow Recent Labs  Lab 05/13/23 0008 05/13/23 0110 05/13/23 0920 05/13/23 2049 05/14/23 0247 05/14/23 0853 05/14/23 1007  NA 117* 121* 121* 119* 124* 125* 125*   Progressive weakness Likely due to hyponatremia versus CHF May need PT eval  Hypothyroidism Continue Synthroid.  TSH in normal range Recent Labs    01/10/23 0856 05/13/23 0237  TSH 1.09 2.423   Mobility: PT eval ordered  Goals of care   Code Status: Full Code     DVT prophylaxis: Heparin drip currently heparin injection 5,000 Units Start: 05/14/23 2200 SCDs Start: 05/13/23 1528   Antimicrobials: None Fluid: None Family Communication: Husband at bedside  Status: Inpatient Level of care:  Progressive   Patient is from: Home Needs to continue in-hospital care: Ongoing cardiac workup Anticipated d/c to: Home hopefully in 1 to 2 days      Diet:  Diet Order             Diet Heart Room service appropriate? Yes; Fluid consistency: Thin  Diet effective now                   Scheduled Meds:  aspirin  324 mg Oral Once   furosemide  20 mg  Oral BID   heparin injection (subcutaneous)  5,000 Units Subcutaneous Q8H   levothyroxine  50 mcg Oral Q0600   rosuvastatin  5 mg Oral Daily   sodium chloride flush  3 mL Intravenous Q12H   sodium chloride flush  3 mL Intravenous Q12H    PRN meds: acetaminophen, morphine injection, nitroGLYCERIN, ondansetron (ZOFRAN) IV, mouth rinse, sodium chloride flush   Infusions:     Antimicrobials: Anti-infectives (From admission, onward)    None       Objective: Vitals:    05/14/23 1200 05/14/23 1230  BP: (!) 97/57 (!) 93/57  Pulse: 100 (!) 115  Resp:    Temp:    SpO2: 97% 96%    Intake/Output Summary (Last 24 hours) at 05/14/2023 1419 Last data filed at 05/14/2023 1255 Gross per 24 hour  Intake 610.29 ml  Output 2350 ml  Net -1739.71 ml   Filed Weights   05/13/23 0010 05/13/23 1721 05/14/23 0547  Weight: 43.5 kg 45.8 kg 44.5 kg   Weight change: 2.223 kg Body mass index is 19.16 kg/m.   Physical Exam: General exam: Pleasant, elderly Caucasian female.  Not in distress Skin: No rashes, lesions or ulcers. HEENT: Atraumatic, normocephalic, no obvious bleeding Lungs: Clear to auscultation bilaterally on my exam CVS: Regular rate and rhythm, systolic ejection murmur present GI/Abd soft, nontender, nondistended, bowel sound present CNS: Alert, awake, oriented x 3 Psychiatry: Mood appropriate Extremities: No pedal edema, no calf tenderness  Data Review: I have personally reviewed the laboratory data and studies available.  F/u labs ordered Unresulted Labs (From admission, onward)     Start     Ordered   05/15/23 0500  Lipoprotein A (LPA)  Tomorrow morning,   R       Question:  Specimen collection method  Answer:  Lab=Lab collect   05/14/23 1057   05/14/23 0500  Basic metabolic panel  Daily,   R     Comments: As Scheduled for 5 days    05/13/23 1527   05/14/23 0500  Magnesium  Daily,   R      05/13/23 1527   05/13/23 0500  CBC  Daily,   R      05/13/23 0218            Total time spent in review of labs and imaging, patient evaluation, formulation of plan, documentation and communication with family: 45 minutes  Signed, Lorin Glass, MD [facility] 05/14/2023

## 2023-05-14 NOTE — Progress Notes (Signed)
   05/14/23 2014  Assess: MEWS Score  Temp 98.1 F (36.7 C)  BP (!) 91/58  MAP (mmHg) 68  Pulse Rate 100  ECG Heart Rate (!) 102  Resp 16  SpO2 99 %  O2 Device Room Air  Assess: MEWS Score  MEWS Temp 0  MEWS Systolic 1  MEWS Pulse 1  MEWS RR 0  MEWS LOC 0  MEWS Score 2  MEWS Score Color Yellow  Assess: if the MEWS score is Yellow or Red  Were vital signs accurate and taken at a resting state? Yes  Does the patient meet 2 or more of the SIRS criteria? No  MEWS guidelines implemented  No, previously yellow, continue vital signs every 4 hours  Assess: SIRS CRITERIA  SIRS Temperature  0  SIRS Pulse 1  SIRS Respirations  0  SIRS WBC 0  SIRS Score Sum  1

## 2023-05-14 NOTE — H&P (View-Only) (Signed)
Progress Note  Patient Name: Martha Swanson Date of Encounter: 05/14/2023  Primary Cardiologist: None   Subjective   Patient seen and examined at her bedside. Her husband was in the room when I arrived. Still with some shortness of breath. No chest pain  Inpatient Medications    Scheduled Meds:  aspirin  324 mg Oral Once   furosemide  20 mg Oral BID   levothyroxine  50 mcg Oral Q0600   rosuvastatin  5 mg Oral Daily   sodium chloride flush  10 mL Intravenous Q12H   sodium chloride flush  3 mL Intravenous Q12H   Continuous Infusions:  sodium chloride     heparin 800 Units/hr (05/14/23 0654)   PRN Meds: sodium chloride, acetaminophen, morphine injection, nitroGLYCERIN, ondansetron (ZOFRAN) IV, mouth rinse, sodium chloride flush   Vital Signs    Vitals:   05/14/23 0017 05/14/23 0508 05/14/23 0547 05/14/23 0749  BP: 97/62 (!) 105/55  (!) 104/59  Pulse: 95 94  91  Resp: 18 18  18   Temp: 98.2 F (36.8 C) 98.2 F (36.8 C)  98.5 F (36.9 C)  TempSrc: Oral Oral  Oral  SpO2: 94% 95%  97%  Weight:   44.5 kg   Height:        Intake/Output Summary (Last 24 hours) at 05/14/2023 0859 Last data filed at 05/14/2023 0654 Gross per 24 hour  Intake 343.29 ml  Output 2880 ml  Net -2536.71 ml   Filed Weights   05/13/23 0010 05/13/23 1721 05/14/23 0547  Weight: 43.5 kg 45.8 kg 44.5 kg    Telemetry    Sinus rhythm - Personally Reviewed  ECG    Sinus rhythm, Hr 89 bpm with noted LVH - Personally Reviewed  Physical Exam    General: Comfortable Head: Atraumatic, normal size  Eyes: PEERLA, EOMI  Neck: Supple, normal JVD Cardiac: Normal S1, S2; RRR; no murmurs, rubs, or gallops Lungs: Clear to auscultation bilaterally Abd: Soft, nontender, no hepatomegaly  Ext: warm, no edema Musculoskeletal: No deformities, BUE and BLE strength normal and equal Skin: Warm and dry, no rashes   Neuro: Alert and oriented to person, place, time, and situation, CNII-XII grossly  intact, no focal deficits  Psych: Normal mood and affect   Labs    Chemistry Recent Labs  Lab 05/13/23 0008 05/13/23 0110 05/13/23 0920 05/13/23 2049 05/14/23 0247  NA 117*   < > 121* 119* 124*  K 4.1   < > 4.3 4.0 4.3  CL 88*   < > 94* 91* 95*  CO2 20*  --  21* 22 24  GLUCOSE 138*   < > 111* 108* 103*  BUN 15   < > 12 16 11   CREATININE 0.86   < > 0.78 0.85 0.93  CALCIUM 8.4*  --  8.3* 8.3* 8.2*  PROT 5.3*  --   --   --   --   ALBUMIN 3.2*  --   --   --   --   AST 28  --   --   --   --   ALT 10  --   --   --   --   ALKPHOS 42  --   --   --   --   BILITOT 1.0  --   --   --   --   GFRNONAA >60  --  >60 >60 >60  ANIONGAP 9  --  6 6 5    < > = values in  this interval not displayed.     Hematology Recent Labs  Lab 05/13/23 0237 05/13/23 2048 05/14/23 0247  WBC 12.9* 7.9 7.8  RBC 3.92 3.91 3.97  HGB 11.9* 11.7* 11.9*  HCT 33.9* 33.2* 33.8*  MCV 86.5 84.9 85.1  MCH 30.4 29.9 30.0  MCHC 35.1 35.2 35.2  RDW 12.2 12.3 12.2  PLT 272 264 250    Cardiac EnzymesNo results for input(s): "TROPONINI" in the last 168 hours. No results for input(s): "TROPIPOC" in the last 168 hours.   BNP Recent Labs  Lab 05/13/23 0008  BNP 1,165.8*     DDimer No results for input(s): "DDIMER" in the last 168 hours.   Radiology    DG Chest Portable 1 View  Result Date: 05/13/2023 CLINICAL DATA:  Chest pain radiating down left side of arm. Intermittent nausea, shortness of breath, weakness, and fatigue. EXAM: PORTABLE CHEST 1 VIEW COMPARISON:  02/05/2011. FINDINGS: The heart size and mediastinal contours are within normal limits. There is atherosclerotic calcification of the aorta. Interstitial prominence is noted bilaterally with scattered airspace disease in the mid to lower lung fields. Kerley B-lines are seen bilaterally. There is a small right pleural effusion. No pneumothorax is seen. No acute osseous abnormality. IMPRESSION: 1. Findings compatible with pulmonary edema. 2. Small  right pleural effusion. Electronically Signed   By: Thornell Sartorius M.D.   On: 05/13/2023 00:37    Cardiac Studies  Echo pending    Patient Profile     77 y.o. 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 Echo done this morning my review moderately depressed ejection fraction with wall motion abnormalities concerning for anterior wall infarction.  She is already on the schedule for LHC today. Please continue the heparin gtt.  Informed Consent   Shared Decision Making/Informed Consent The risks [stroke (1 in 1000), death (1 in 1000), kidney failure [usually temporary] (1 in 500), bleeding (1 in 200), allergic reaction [possibly serious] (1 in 200)], benefits (diagnostic support and management of coronary artery disease) and alternatives of a cardiac catheterization were discussed in detail with Ms. Pyles and she is willing to proceed.  Continue Aspirin and statin.     For questions or updates, please contact [facility] Please consult [email or web address] for contact info under Cardiology/STEMI.      Signed, [name], DO  05/14/2023, 8:59 AM

## 2023-05-14 NOTE — TOC Initial Note (Signed)
Transition of Care [facility]) - Initial/Assessment Note    Patient Details  Name: Martha Swanson MRN: [id number] Date of Birth: 1945-08-20  Transition of Care [facility]) CM/SW Contact:    Leone Haven, RN Phone Number: 05/14/2023, 1:53 PM  Clinical Narrative:                  Per Spouse ,From home with spouse, has PCP and insurance on file, states has no HH services in place at this time or DME at home.  States family member will transport them home at [employer] and family is support system, states gets medications from [facility] on [address]  Pta self ambulatory. For heart cath today.  Expected Discharge Plan: Home/Self Care Barriers to Discharge: Continued Medical Work up   Patient Goals and CMS Choice Patient states their goals for this hospitalization and ongoing recovery are:: return home   Choice offered to / list presented to : NA      Expected Discharge Plan and Services In-house Referral: NA Discharge Planning Services: CM Consult Post Acute Care Choice: NA Living arrangements for the past 2 months: Single Family Home                 DME Arranged: N/A DME Agency: NA       HH Arranged: NA          Prior Living Arrangements/Services Living arrangements for the past 2 months: Single Family Home Lives with:: Spouse Patient language and need for interpreter reviewed:: Yes Do you feel safe going back to the place where you live?: Yes      Need for Family Participation in Patient Care: Yes (Comment) Care giver support system in place?: Yes (comment)   Criminal Activity/Legal Involvement Pertinent to Current Situation/Hospitalization: No - Comment as needed  Activities of Daily Living   ADL Screening (condition at time of admission) Independently performs ADLs?: Yes (appropriate for developmental age) Is the patient deaf or have difficulty hearing?: No Does the patient have difficulty seeing, even when wearing glasses/contacts?: No Does the patient have  difficulty concentrating, remembering, or making decisions?: No  Permission Sought/Granted Permission sought to share information with : Case Manager Permission granted to share information with : Yes, Verbal Permission Granted              Emotional Assessment Appearance:: Appears stated age     Orientation: : Oriented to Self, Oriented to Place, Oriented to  Time, Oriented to Situation Alcohol / Substance Use: Not Applicable Psych Involvement: No (comment)  Admission diagnosis:  Hyponatremia [E87.1] NSTEMI (non-ST elevated myocardial infarction) (HCC) [I21.4] Acute heart failure, unspecified heart failure type (HCC) [I50.9] Decompensated heart failure (HCC) [I50.9] Patient Active Problem List   Diagnosis Date Noted   Decompensated heart failure (HCC) 05/13/2023   Hyponatremia 01/11/2023   Essential hypertension 02/28/2022   Elevated LDL cholesterol level 06/25/2019   Healthcare maintenance 06/25/2019   Heart murmur 03/07/2018   Vitamin D deficiency 12/05/2017   Age-related osteoporosis without current pathological fracture 09/04/2017   Takotsubo cardiomyopathy    Acquired hypothyroidism    PCP:  [name], MD Pharmacy:   [id number] [place], [place] - [address] AT [facility] [address] & [address] [place] [address] Phone: [phone] Fax: [phone]     Social Determinants of Health (SDOH) Social History: SDOH Screenings   Food Insecurity: No Food Insecurity (05/13/2023)  Housing: Low Risk  (05/13/2023)  Transportation Needs: No  Transportation Needs (05/13/2023)  Utilities: Not At Risk (05/13/2023)  Alcohol Screen: Low Risk  (01/09/2023)  Depression (PHQ2-9): Low Risk  (01/10/2023)  Financial Resource Strain: Low Risk  (05/14/2023)  Physical Activity: Insufficiently Active (01/09/2023)  Social Connections: Socially Integrated (01/09/2023)  Stress: No Stress Concern Present (01/09/2023)  Tobacco Use: Low  Risk  (05/13/2023)  Health Literacy: Adequate Health Literacy (01/07/2023)   SDOH Interventions: Financial Strain Interventions: Intervention Not Indicated   Readmission Risk Interventions     No data to display

## 2023-05-15 ENCOUNTER — Inpatient Hospital Stay (HOSPITAL_COMMUNITY): Payer: Medicare Other

## 2023-05-15 ENCOUNTER — Other Ambulatory Visit (HOSPITAL_COMMUNITY): Payer: Self-pay

## 2023-05-15 DIAGNOSIS — I5181 Takotsubo syndrome: Secondary | ICD-10-CM

## 2023-05-15 DIAGNOSIS — I509 Heart failure, unspecified: Secondary | ICD-10-CM | POA: Diagnosis not present

## 2023-05-15 LAB — BASIC METABOLIC PANEL
Anion gap: 5 (ref 5–15)
BUN: 11 mg/dL (ref 8–23)
CO2: 25 mmol/L (ref 22–32)
Calcium: 8.4 mg/dL — ABNORMAL LOW (ref 8.9–10.3)
Chloride: 99 mmol/L (ref 98–111)
Creatinine, Ser: 1 mg/dL (ref 0.44–1.00)
GFR, Estimated: 58 mL/min — ABNORMAL LOW (ref >60)
Glucose, Bld: 102 mg/dL — ABNORMAL HIGH (ref 70–99)
Potassium: 4.2 mmol/L (ref 3.5–5.1)
Sodium: 129 mmol/L — ABNORMAL LOW (ref 135–145)

## 2023-05-15 LAB — CBC
HCT: 34.3 % — ABNORMAL LOW (ref 36.0–46.0)
Hemoglobin: 12 g/dL (ref 12.0–15.0)
MCH: 30.1 pg (ref 26.0–34.0)
MCHC: 35 g/dL (ref 30.0–36.0)
MCV: 86 fL (ref 80.0–100.0)
Platelets: 241 10*3/uL (ref 150–400)
RBC: 3.99 MIL/uL (ref 3.87–5.11)
RDW: 12.4 % (ref 11.5–15.5)
WBC: 7 10*3/uL (ref 4.0–10.5)
nRBC: 0 % (ref 0.0–0.2)

## 2023-05-15 LAB — MAGNESIUM: Magnesium: 2.1 mg/dL (ref 1.7–2.4)

## 2023-05-15 MED ORDER — EMPAGLIFLOZIN 10 MG PO TABS: 10.0000 mg | ORAL_TABLET | Freq: Every day | ORAL | 2 refills | Status: DC

## 2023-05-15 MED ORDER — FUROSEMIDE 20 MG PO TABS: 20.0000 mg | ORAL_TABLET | ORAL | 2 refills | Status: DC

## 2023-05-15 MED ORDER — EMPAGLIFLOZIN 10 MG PO TABS
10.0000 mg | ORAL_TABLET | Freq: Every day | ORAL | Status: DC
  Administered 2023-05-15: 10 mg via ORAL
  Filled 2023-05-15: qty 1

## 2023-05-15 MED ORDER — METOPROLOL SUCCINATE ER 25 MG PO TB24: 12.5000 mg | ORAL_TABLET | Freq: Every day | ORAL | 2 refills | Status: DC

## 2023-05-15 MED ORDER — METOPROLOL SUCCINATE ER 25 MG PO TB24
25.0000 mg | ORAL_TABLET | Freq: Every day | ORAL | Status: DC
  Filled 2023-05-15: qty 1

## 2023-05-15 MED ORDER — GADOBUTROL 1 MMOL/ML IV SOLN
8.0000 mL | Freq: Once | INTRAVENOUS | Status: AC | PRN
  Administered 2023-05-15: 8 mL via INTRAVENOUS

## 2023-05-15 NOTE — Progress Notes (Addendum)
   Heart Failure Stewardship Pharmacist Progress Note   PCP: [name], MD PCP-Cardiologist: None    HPI:  76 yo F with PMH of stress cardiomyopathy in 2012 and hypothyroidism.   Presented to the ED on 11/25 with chest pain, shortness of breath, nausea, and fatigue. Denied LE edema, orthopnea, and PND. BNP 1165. CXR with pulmonary edema and small R pleural effusion. ECHO on 11/26 with LVEF 40-45%, wall motion abnormalities consistent with Takotsubo cardiomyopathy vs large anterior infarct, moderate to severe MR. Taken for [facility] and found to have NICM with RA 2, PA 21, wedge 11, CO/CI 4.4/3.1.  Reports mild shortness of breath and episode of chest discomfort this morning, now resolved. Planning for cMRI before discharge.  Has a high deductible insurance plan but her husband states that they are able to transition to a different insurance plan before 12/7 to gain better coverage on medications starting 1/1.   Current HF Medications: Diuretic: furosemide 20 mg BID  Prior to admission HF Medications: ACE/ARB/ARNI: lisinopril 10 mg daily  Pertinent Lab Values: Serum creatinine 1.00, BUN 11, Potassium 4.2, Sodium 129, BNP 1165.8, Magnesium 2.1, A1c 5.2   Vital Signs: Weight: 97 lbs (admission weight: 100 lbs) Blood pressure: 90/60s  Heart rate: 80-100s  I/O: net -0.3L yesterday; net -2.3L since admission  Medication Assistance / Insurance Benefits Check: Does the patient have prescription insurance?  Yes Type of insurance plan: Medicare + BCBS supplement  Outpatient Pharmacy:  Prior to admission outpatient pharmacy: [facility] Is the patient willing to use [facility] TOC pharmacy at discharge? Yes Is the patient willing to transition their outpatient pharmacy to utilize a [facility] outpatient pharmacy?   No    Assessment: 1. Acute on chronic systolic CHF (LVEF 40-45%), due to Takotsubo cardiomyopathy. NYHA class II symptoms. - Continue furosemide 20 mg PO BID. Strict I/Os  and daily weights. Keep K>4 and Mg>2.  - GDMT limited by hypotension. Patient is not symptomatic. - Consider starting Jardiance 10 mg daily    Plan: 1) Medication changes recommended at this time: - Start Jardiance 10 mg daily  2) Patient assistance: - High deductible insurance plan  - Initial Jardiance copay >$500 - Will plan to use free 30 day trial + samples to get to 1/1  - Husband will contact their insurance agent to update her plan for the 2025 year.   3)  Education  - Patient has been educated on current HF medications and potential additions to HF medication regimen - Patient verbalizes understanding that over the next few months, these medication doses may change and more medications may be added to optimize HF regimen - Patient has been educated on basic disease state pathophysiology and goals of therapy   Sharen Hones, PharmD, BCPS Heart Failure Stewardship Pharmacist Phone [phone]

## 2023-05-15 NOTE — Progress Notes (Signed)
Progress Note  Patient Name: Martha Swanson Date of Encounter: 05/15/2023  Primary Cardiologist: None   Subjective   Patient seen and examined at her bedside. She was awake in bed when I arrived.  Inpatient Medications    Scheduled Meds:  empagliflozin  10 mg Oral Daily   furosemide  20 mg Oral BID   heparin injection (subcutaneous)  5,000 Units Subcutaneous Q8H   levothyroxine  50 mcg Oral Q0600   rosuvastatin  5 mg Oral Daily   sodium chloride flush  3 mL Intravenous Q12H   sodium chloride flush  3 mL Intravenous Q12H   Continuous Infusions:   PRN Meds: acetaminophen, morphine injection, nitroGLYCERIN, ondansetron (ZOFRAN) IV, mouth rinse, sodium chloride flush   Vital Signs    Vitals:   05/14/23 2014 05/14/23 2357 05/15/23 0502 05/15/23 0740  BP: (!) 91/58 92/63 (!) 99/59   Pulse: 100 91 94 100  Resp: 16 16    Temp: 98.1 F (36.7 C) 98.1 F (36.7 C) 98.1 F (36.7 C)   TempSrc: Oral Oral Oral Oral  SpO2: 99% 95% 98% 100%  Weight:   44.4 kg   Height:        Intake/Output Summary (Last 24 hours) at 05/15/2023 0947 Last data filed at 05/15/2023 0945 Gross per 24 hour  Intake 977 ml  Output 1050 ml  Net -73 ml   Filed Weights   05/13/23 1721 05/14/23 0547 05/15/23 0502  Weight: 45.8 kg 44.5 kg 44.4 kg    Telemetry    Sinus rhythm - Personally Reviewed  ECG    Sinus rhythm, Hr 89 bpm with noted LVH - Personally Reviewed  Physical Exam    General: Comfortable Head: Atraumatic, normal size  Eyes: PEERLA, EOMI  Neck: Supple, normal JVD Cardiac: Normal S1, S2; RRR; no murmurs, rubs, or gallops Lungs: Clear to auscultation bilaterally Abd: Soft, nontender, no hepatomegaly  Ext: warm, no edema Musculoskeletal: No deformities, BUE and BLE strength normal and equal Skin: Warm and dry, no rashes   Neuro: Alert and oriented to person, place, time, and situation, CNII-XII grossly intact, no focal deficits  Psych: Normal mood and affect   Labs     Chemistry Recent Labs  Lab 05/13/23 0008 05/13/23 0110 05/14/23 0247 05/14/23 0853 05/14/23 1006 05/14/23 1007 05/14/23 1019 05/15/23 0233  NA 117*   < > 124* 125*   < > 125* 126* 129*  K 4.1   < > 4.3 4.0   < > 4.0 3.9 4.2  CL 88*   < > 95* 93*  --   --   --  99  CO2 20*   < > 24 23  --   --   --  25  GLUCOSE 138*   < > 103* 106*  --   --   --  102*  BUN 15   < > 11 11  --   --   --  11  CREATININE 0.86   < > 0.93 0.92  --   --   --  1.00  CALCIUM 8.4*   < > 8.2* 8.7*  --   --   --  8.4*  PROT 5.3*  --   --   --   --   --   --   --   ALBUMIN 3.2*  --   --   --   --   --   --   --   AST 28  --   --   --   --   --   --   --  ALT 10  --   --   --   --   --   --   --   ALKPHOS 42  --   --   --   --   --   --   --   BILITOT 1.0  --   --   --   --   --   --   --   GFRNONAA >60   < > >60 >60  --   --   --  58*  ANIONGAP 9   < > 5 9  --   --   --  5   < > = values in this interval not displayed.     Hematology Recent Labs  Lab 05/13/23 2048 05/14/23 0247 05/14/23 1006 05/14/23 1007 05/14/23 1019 05/15/23 0233  WBC 7.9 7.8  --   --   --  7.0  RBC 3.91 3.97  --   --   --  3.99  HGB 11.7* 11.9*   < > 11.6* 11.2* 12.0  HCT 33.2* 33.8*   < > 34.0* 33.0* 34.3*  MCV 84.9 85.1  --   --   --  86.0  MCH 29.9 30.0  --   --   --  30.1  MCHC 35.2 35.2  --   --   --  35.0  RDW 12.3 12.2  --   --   --  12.4  PLT 264 250  --   --   --  241   < > = values in this interval not displayed.    Cardiac EnzymesNo results for input(s): "TROPONINI" in the last 168 hours. No results for input(s): "TROPIPOC" in the last 168 hours.   BNP Recent Labs  Lab 05/13/23 0008  BNP 1,165.8*     DDimer No results for input(s): "DDIMER" in the last 168 hours.   Radiology    CARDIAC CATHETERIZATION  Result Date: 05/14/2023 Images from the original result were not included. Right and left heart catheterization 05/14/2023: Hemodynamic data: RA/4, mean 2 mmHg. RV 29/-4, EDP 3 mmHg. PA 30/11, mean  21 mmHg. PW 16/13, mean 11 mmHg. PA saturation 69%, AO saturation 95%. QP/QS 1.00.  PVR 183 dynes per second. CO 4.38/CI 3.16. LV 134/25, EDP 19 mmHg.  Ao 105/60, mean 71 mmHg. Peak to peak aortic valve gradient/LVOT gradient of 59 mmHg with a mean of 28.5 mmHg, aortic valve area calculated at 0.67 cm with dimensionless index 0.48 most consistent with LVOT gradient. Angiographic data: RCA: Dominant, smooth and normal. LM: Large-caliber vessel, smooth and normal. LAD: Gives origin to a very large D1.  LAD is severely tortuous in the midsegment.  Otherwise smooth and normal. RI: Large vessel, again tortuous but smooth and normal. CX: Gives origin to a high OM1 which is large and again severely tortuous and a moderate size OM 2 and continues as OM 3 after giving a small AV groove branch.  Smooth and normal. Impression: Normal right heart catheterization.  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 Patient remained tachycardic throughout, orders for metoprolol succinate placed, needs optimization of heart failure therapy.   ECHOCARDIOGRAM COMPLETE  Result Date: 05/14/2023    ECHOCARDIOGRAM REPORT   Patient Name:   Martha Swanson Date of Exam: 05/14/2023 Medical Rec #:  [id number]        Height:  60.0 in Accession #:    [id number]       Weight:       98.1 lb Date of Birth:  06-May-1946        BSA:          1.379 m Patient Age:    77 years         BP:           105/55 mmHg Patient Gender: F                HR:           100 bpm. Exam Location:  Inpatient Procedure: 2D Echo, 3D Echo, Cardiac Doppler, Color Doppler and Strain Analysis Indications:    Nonischemic Cardiomyopathy  History:        Patient has no prior history of Echocardiogram examinations.                 Risk Factors:Hypertension and Dyslipidemia.  Sonographer:     Karma Ganja Referring Phys: [id number] Russell Desanctis  Sonographer Comments: Global longitudinal strain was attempted. IMPRESSIONS  1. Wall motion abnormalities appear consistent with takotsubo cardiomyopathy or potentially a large anterior infarct. There is a dynamic flow acceleration in the LVOT with associated SAM. Peak gradient of . Left ventricular ejection fraction, by estimation, is 40 to 45%. Left ventricular ejection fraction by 3D volume is 40 %. The left ventricle has mildly decreased function. The left ventricle demonstrates regional wall motion abnormalities (see scoring diagram/findings for description). There is mild asymmetric left ventricular hypertrophy of the basal-septal segment. Left ventricular diastolic parameters are consistent with Grade II diastolic dysfunction (pseudonormalization). Hyperdynamic basal segments and akinesis of the mid-distal and apical segments.  2. Right ventricular systolic function is normal. The right ventricular size is normal. There is mildly elevated pulmonary artery systolic pressure.  3. Left atrial size was moderately dilated.  4. The mitral valve is normal in structure. Moderate to severe mitral valve regurgitation.  5. There is elevated gradients across the aortic valve but the shape of the jet and appearance of the valve are more consistent with dynamic LVOT obstruction as opposed to true aortic stenosis. The aortic valve is normal in structure. Aortic valve regurgitation is not visualized. Aortic valve mean gradient measures 36.0 mmHg. Aortic valve Vmax measures 4.00 m/s. FINDINGS  Left Ventricle: Wall motion abnormalities appear consistent with takotsubo cardiomyopathy or potentially a large anterior infarct. There is a dynamic flow acceleration in the LVOT with associated SAM. Peak gradient of . Left ventricular ejection fraction, by estimation, is 40 to 45%. Left ventricular ejection fraction by 3D volume is 40 %. The left ventricle has mildly  decreased function. The left ventricle demonstrates regional wall motion abnormalities. The left ventricular internal cavity size was normal in size. There is mild asymmetric left ventricular hypertrophy of the basal-septal segment. Left ventricular diastolic parameters are consistent with Grade II diastolic dysfunction (pseudonormalization).  LV Wall Scoring: The mid and distal anterior wall, mid and distal anterior septum, mid and distal inferior wall, mid inferoseptal segment, and apex are akinetic. The mid and distal lateral wall is hypokinetic. The antero-lateral wall, basal anteroseptal segment, basal inferolateral segment, basal anterior segment, basal inferior segment, and basal inferoseptal segment are normal. Hyperdynamic basal segments and akinesis of the mid-distal and apical segments. Right Ventricle: The right ventricular size is normal. No increase in right ventricular wall thickness. Right ventricular systolic function is normal. There is mildly elevated pulmonary artery systolic pressure. The tricuspid  regurgitant velocity is 2.91  m/s, and with an assumed right atrial pressure of 3 mmHg, the estimated right ventricular systolic pressure is 36.9 mmHg. Left Atrium: Left atrial size was moderately dilated. Right Atrium: Right atrial size was normal in size. Pericardium: There is no evidence of pericardial effusion. Mitral Valve: There is systolic anterior motion of the mitral valve. The mitral valve is normal in structure. There is mild thickening of the mitral valve leaflet(s). Moderate to severe mitral valve regurgitation. Tricuspid Valve: The tricuspid valve is normal in structure. Tricuspid valve regurgitation is mild. Aortic Valve: There is elevated gradients across the aortic valve but the shape of the jet and appearance of the valve are more consistent with dynamic LVOT obstruction as opposed to true aortic stenosis. The aortic valve is normal in structure. Aortic valve regurgitation is not  visualized. Aortic valve mean gradient measures 36.0 mmHg. Aortic valve peak gradient measures 64.0 mmHg. Pulmonic Valve: The pulmonic valve was normal in structure. Pulmonic valve regurgitation is not visualized. Aorta: The aortic root and ascending aorta are structurally normal, with no evidence of dilitation. IAS/Shunts: No atrial level shunt detected by color flow Doppler.  LEFT VENTRICLE PLAX 2D LVIDd:         4.20 cm         Diastology LVIDs:         2.80 cm         LV e' medial:    4.46 cm/s LV PW:         1.00 cm         LV E/e' medial:  17.9 LV IVS:        1.00 cm         LV e' lateral:   5.44 cm/s LVOT diam:     1.90 cm         LV E/e' lateral: 14.7 LVOT Area:     2.84 cm                                 3D Volume EF LV Volumes (MOD)               LV 3D EF:    Left LV vol d, MOD    55.2 ml                    ventricul A2C:                                        ar LV vol d, MOD    48.5 ml                    ejection A4C:                                        fraction LV vol s, MOD    30.9 ml                    by 3D A2C:                                        volume  is LV vol s, MOD    31.6 ml                    40 %. A4C: LV SV MOD A2C:   24.3 ml LV SV MOD A4C:   48.5 ml       3D Volume EF: LV SV MOD BP:    23.3 ml       3D EF:        40 %                                LV EDV:       103 ml                                LV ESV:       62 ml                                LV SV:        41 ml RIGHT VENTRICLE             IVC RV Basal diam:  2.70 cm     IVC diam: 1.50 cm RV S prime:     18.80 cm/s TAPSE (M-mode): 1.6 cm LEFT ATRIUM             Index        RIGHT ATRIUM          Index LA diam:        3.30 cm 2.39 cm/m   RA Area:     7.17 cm LA Vol (A2C):   49.9 ml 36.18 ml/m  RA Volume:   12.10 ml 8.77 ml/m LA Vol (A4C):   49.3 ml 35.75 ml/m LA Biplane Vol: 49.8 ml 36.11 ml/m  AORTIC VALVE AV Vmax:      400.00 cm/s AV Vmean:     276.000 cm/s AV VTI:       0.717 m AV Peak Grad: 64.0 mmHg AV Mean Grad:  36.0 mmHg  AORTA Ao Root diam: 2.50 cm MITRAL VALVE               TRICUSPID VALVE MV Area (PHT): 5.75 cm    TR Peak grad:   33.9 mmHg MV Decel Time: 132 msec    TR Vmax:        291.00 cm/s MR Peak grad: 192.7 mmHg MR Mean grad: 112.0 mmHg   SHUNTS MR Vmax:      694.00 cm/s  Systemic Diam: 1.90 cm MR Vmean:     494.0 cm/s MV E velocity: 79.80 cm/s MV A velocity: 83.90 cm/s MV E/A ratio:  0.95 Clearnce Hasten Electronically signed by Clearnce Hasten Signature Date/Time: 05/14/2023/9:12:03 AM    Final     Cardiac Studies  Echo reviewed   Patient Profile     77 y.o. 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 Basal septal variant of HCM) Acute on chronic heart failure with reduced ejection fraction Pulmonary Edema Atypical chest pain, with troponin elevation suspected type II MI in the setting of volume overload Hyponatremia Abnormal echo   She is  status post LHC yesterday - No CAD.  Echo show abnormal wall motion . This picture is consistent takotsubo/stress cardiomyopathy.  Also with the increased LVOT gradient, it will benefit her greatly to get a cardiac MR to rule to understand gadolinium enhancement and get more information on  suspect variant of basal septal HCM.   There is also associated Mitral regurgitation in the setting of her SAM - cautious diurectics, switch to lasix 20 every other day, she need low dose beta blocker but blood pressure can not tolerate at this time. Other GDMT - starting SGLT2-inhibitors, others need to be start Entresto etc once blood pressure improves.   Hyponatramia - slightly improving.    No cad stop Aspirin and statin.    For questions or updates, please contact [facility] Please consult [email or web address] for contact info under Cardiology/STEMI.      Osvaldo Shipper, DO   05/15/2023, 9:47 AM

## 2023-05-15 NOTE — Progress Notes (Signed)
CARDIAC REHAB PHASE I   PRE:  Rate/Rhythm: 94 (Pulse-Ox)  BP:  Sitting: 99/61      SpO2: 97 RA  MODE:  Ambulation: 360 ft    POST:  Rate/Rhythm: 104 ST (Pulse-Ox)  BP:  Sitting: 94/57      SpO2: 97 RA  Pt ambulated with standby assistance, pt reports being fairly active at home and at the [facility] with husband. Pt denies having chest pain and SOB. Reports feeling tired after walking. Pt was educated with husnad present.  Pt was educated on Type II MI, wt restrictions, no baths/daily wash-ups, s/s of infection, ex guidelines, s/s to stop exercising, heart healthy and low NA diet, risk factors and CRPII. Pt received MI book and materials on exercise, diet, and CRPII. Will refer to [facility].    Faustino Congress  MS, ACSM-CEP 10:35 AM 05/15/2023    Service time is from 1002 to 1035.

## 2023-05-15 NOTE — Hospital Course (Addendum)
Martha Swanson is a 77 y.o. female with PMH significant for Takotsubo cardiomyopathy 2012, HTN, HLD, hypothyroidism. 11/24, patient was brought to the ED from home by EMS after an episode of chest pain radiating to the left arm. Patient took aspirin at home and EMS gave Zofran sublingual nitroglycerin.   Of note, patient also reports that for the last 1 week, he has been feeling weak and unable to complete her routine tasks.  She also developed dyspnea and some vague episodes of left shoulder pain.  She has not been able to sleep well and feels tired all the time. She feels that her symptoms are similar to what when she had Takotsubo cardiomyopathy in 2012   In the ED, patient was afebrile, heart rate 90s, blood pressure in 90s, breathing on room air Initial labs with sodium low at 117, BNP elevated to 1100, troponin elevated to 1300, WBC count 12.3 Urinalysis clear.  Urine osmolality low at 286, urine sodium at 55 Chest x-ray showed pulm edema Patient was admitted to cardiology service.  Heparin GTT was started Hospitalist service consulted for medical management   **Interim History  She underwent further cardiac workup with echocardiogram, cardiac cath and now will be getting a cardiac MRI and cardiology is planning on clearing her for discharge after cardiac MRI given that they feel that she has Stress Cardiomyopathy.  She is medically stable to be discharged at this time and sodium is improved.  She will need to follow-up with PCP and Cardiology within 1 to 2 weeks.   Assessment and Plan:  Suspected Acute Exacerbation of CHF Moderate to severe Mitral Regurgitation H/o Takotsubo cardiomyopathy 2012 Hypertension -Patient feels that her symptoms are similar to last episode of CHF.  -Echocardiogram done with low EF and anterior wall hypokinesis -large infarct versus Takotsubo -Underwent Cardiac Cath on 05/04/23 and no CAD was noted -Cardiology feels that this picture is consistent with  Takotsubo/Stress Cardiomyopathy -BNP was 1165.8 on Admission  Intake/Output Summary (Last 24 hours) at 05/15/2023 1213 Last data filed at 05/15/2023 0945 Gross per 24 hour  Intake 977 ml  Output 1050 ml  Net -73 ml    -Given that the patient has an Increased LVOT Gradient cardiology is recommending obtaining a cardiac MRI with gadolinium enhancement to get more information on suspected grade of basal septal HCM -Cardiology recommending cautious diuretics and have now switched to oral Lasix 20 mg every other day and recommending low-dose beta-blocker but her blood pressure cannot tolerate this at the time -Also recommending GDMT and starting SGLT2 inhibitors and recommending starting Entresto once her blood pressure improves   NSTEMI ruled out but had Elevated Troponin HLD -Also reports chest pain. -Troponin was 1588 -Elevated troponin could be due to LV strain but also need to rule out type I MI since troponin is significantly elevated and no evidence of NSTEMI on cardiac catheter -Was on Heparin drip, aspirin and Crestor but given no CAD aspirin and statin are being stopped   Hyponatremia -Present with significantly low sodium of 117 and osmolality of 258 -Urine osmolality also low at 286 which rules out SIADH.  Per patient's husband, patient takes very little amount of salt daily. -Hyponatremia likely due to combination of low salt intake and CHF -Na+ Trend: Recent Labs  Lab 05/13/23 2049 05/14/23 0247 05/14/23 0853 05/14/23 1006 05/14/23 1007 05/14/23 1019 05/15/23 0233  NA 119* 124* 125* 125* 125* 126* 129*  -Repeat CMP within 1 week   Progressive Weakness -Likely due to  hyponatremia versus CHF -PT evaluated and recommending Home Health   Hypothyroidism -Continue Levothyroxine 50 mcg po Dailu -TSH in normal range Recent Labs  Lab 05/13/23 0237  TSH 2.423   Hypoalbuminemia -Patient's Albumin Trend: Recent Labs  Lab 05/13/23 0008  ALBUMIN 3.2*  -Continue to  Monitor and Trend and repeat CMP in the AM

## 2023-05-15 NOTE — Progress Notes (Signed)
PROGRESS NOTE    Martha Swanson  WUJ:[id number] DOB: 06-Mar-1946 DOA: 05/12/2023 PCP: [name], MD   Brief Narrative:  Martha Swanson is a 77 y.o. female with PMH significant for Takotsubo cardiomyopathy 2012, HTN, HLD, hypothyroidism. 11/24, patient was brought to the ED from home by EMS after an episode of chest pain radiating to the left arm. Patient took aspirin at home and EMS gave Zofran sublingual nitroglycerin.   Of note, patient also reports that for the last 1 week, he has been feeling weak and unable to complete her routine tasks.  She also developed dyspnea and some vague episodes of left shoulder pain.  She has not been able to sleep well and feels tired all the time. She feels that her symptoms are similar to what when she had Takotsubo cardiomyopathy in 2012   In the ED, patient was afebrile, heart rate 90s, blood pressure in 90s, breathing on room air Initial labs with sodium low at 117, BNP elevated to 1100, troponin elevated to 1300, WBC count 12.3 Urinalysis clear.  Urine osmolality low at 286, urine sodium at 55 Chest x-ray showed pulm edema Patient was admitted to cardiology service.  Heparin GTT was started Hospitalist service consulted for medical management   **Interim History  She underwent further cardiac workup with echocardiogram, cardiac cath and now will be getting a cardiac MRI and cardiology is planning on clearing her for discharge after cardiac MRI given that they feel that she has Stress Cardiomyopathy.  She is medically stable to be discharged at this time and sodium is improved.  She will need to follow-up with PCP and Cardiology within 1 to 2 weeks.   Assessment and Plan:  Suspected Acute Exacerbation of CHF Moderate to severe Mitral Regurgitation H/o Takotsubo cardiomyopathy 2012 Hypertension -Patient feels that her symptoms are similar to last episode of CHF.  -Echocardiogram done with low EF and anterior wall hypokinesis -large  infarct versus Takotsubo -Underwent Cardiac Cath on 05/04/23 and no CAD was noted -Cardiology feels that this picture is consistent with Takotsubo/Stress Cardiomyopathy -BNP was 1165.8 on Admission  Intake/Output Summary (Last 24 hours) at 05/15/2023 1213 Last data filed at 05/15/2023 0945 Gross per 24 hour  Intake 977 ml  Output 1050 ml  Net -73 ml    -Given that the patient has an Increased LVOT Gradient cardiology is recommending obtaining a cardiac MRI with gadolinium enhancement to get more information on suspected grade of basal septal HCM -Cardiology recommending cautious diuretics and have now switched to oral Lasix 20 mg every other day and recommending low-dose beta-blocker but her blood pressure cannot tolerate this at the time -Also recommending GDMT and starting SGLT2 inhibitors and recommending starting Entresto once her blood pressure improves   NSTEMI ruled out but had Elevated Troponin HLD -Also reports chest pain. -Troponin was 1588 -Elevated troponin could be due to LV strain but also need to rule out type I MI since troponin is significantly elevated and no evidence of NSTEMI on cardiac catheter -Was on Heparin drip, aspirin and Crestor but given no CAD aspirin and statin are being stopped   Hyponatremia -Present with significantly low sodium of 117 and osmolality of 258 -Urine osmolality also low at 286 which rules out SIADH.  Per patient's husband, patient takes very little amount of salt daily. -Hyponatremia likely due to combination of low salt intake and CHF -Na+ Trend: Recent Labs  Lab 05/13/23 2049 05/14/23 0247 05/14/23 0853 05/14/23 1006 05/14/23 1007 05/14/23 1019  05/15/23 0233  NA 119* 124* 125* 125* 125* 126* 129*  -Repeat CMP within 1 week   Progressive Weakness -Likely due to hyponatremia versus CHF -PT evaluated and recommending Home Health   Hypothyroidism -Continue Levothyroxine 50 mcg po Dailu -TSH in normal range Recent Labs  Lab  05/13/23 0237  TSH 2.423   Hypoalbuminemia -Patient's Albumin Trend: Recent Labs  Lab 05/13/23 0008  ALBUMIN 3.2*  -Continue to Monitor and Trend and repeat CMP in the AM  DVT prophylaxis: heparin injection 5,000 Units Start: 05/14/23 2200 SCDs Start: 05/13/23 1528    Code Status: Full Code Family Communication: Discussed with husband at bedside  Disposition Plan:  Level of care: Progressive Status is: Inpatient Remains inpatient appropriate because: Getting a Cardiac MRI and will likely be discharged after   Consultants:  Cardiology (primary) TRH  Procedures:  ECHOCARDIOGRAM IMPRESSIONS     1. Wall motion abnormalities appear consistent with takotsubo  cardiomyopathy or potentially a large anterior infarct. There is a dynamic  flow acceleration in the LVOT with associated SAM. Peak gradient of  . Left ventricular ejection fraction, by  estimation, is 40 to 45%. Left ventricular ejection fraction by 3D volume  is 40 %. The left ventricle has mildly decreased function. The left  ventricle demonstrates regional wall motion abnormalities (see scoring  diagram/findings for description). There  is mild asymmetric left ventricular hypertrophy of the basal-septal  segment. Left ventricular diastolic parameters are consistent with Grade  II diastolic dysfunction (pseudonormalization). Hyperdynamic basal  segments and akinesis of the mid-distal and  apical segments.   2. Right ventricular systolic function is normal. The right ventricular  size is normal. There is mildly elevated pulmonary artery systolic  pressure.   3. Left atrial size was moderately dilated.   4. The mitral valve is normal in structure. Moderate to severe mitral  valve regurgitation.   5. There is elevated gradients across the aortic valve but the shape of  the jet and appearance of the valve are more consistent with dynamic LVOT  obstruction as opposed to true aortic stenosis. The aortic valve  is normal  in structure. Aortic valve  regurgitation is not visualized. Aortic valve mean gradient measures 36.0  mmHg. Aortic valve Vmax measures 4.00 m/s.   FINDINGS   Left Ventricle: Wall motion abnormalities appear consistent with  takotsubo cardiomyopathy or potentially a large anterior infarct. There is  a dynamic flow acceleration in the LVOT with associated SAM. Peak gradient  of . Left ventricular ejection  fraction, by estimation, is 40 to 45%. Left ventricular ejection fraction  by 3D volume is 40 %. The left ventricle has mildly decreased function.  The left ventricle demonstrates regional wall motion abnormalities. The  left ventricular internal cavity  size was normal in size. There is mild asymmetric left ventricular  hypertrophy of the basal-septal segment. Left ventricular diastolic  parameters are consistent with Grade II diastolic dysfunction  (pseudonormalization).     LV Wall Scoring:  The mid and distal anterior wall, mid and distal anterior septum, mid and  distal inferior wall, mid inferoseptal segment, and apex are akinetic. The  mid and distal lateral wall is hypokinetic. The antero-lateral wall, basal  anteroseptal segment, basal inferolateral segment, basal anterior segment,  basal inferior segment, and basal inferoseptal segment are normal.  Hyperdynamic basal segments and akinesis of the mid-distal and apical  segments.   Right Ventricle: The right ventricular size is normal. No increase in  right ventricular wall thickness. Right ventricular  systolic function is  normal. There is mildly elevated pulmonary artery systolic pressure. The  tricuspid regurgitant velocity is 2.91   m/s, and with an assumed right atrial pressure of 3 mmHg, the estimated  right ventricular systolic pressure is 36.9 mmHg.   Left Atrium: Left atrial size was moderately dilated.   Right Atrium: Right atrial size was normal in size.   Pericardium: There is no evidence  of pericardial effusion.   Mitral Valve: There is systolic anterior motion of the mitral valve. The  mitral valve is normal in structure. There is mild thickening of the  mitral valve leaflet(s). Moderate to severe mitral valve regurgitation.   Tricuspid Valve: The tricuspid valve is normal in structure. Tricuspid  valve regurgitation is mild.   Aortic Valve: There is elevated gradients across the aortic valve but the  shape of the jet and appearance of the valve are more consistent with  dynamic LVOT obstruction as opposed to true aortic stenosis. The aortic  valve is normal in structure. Aortic  valve regurgitation is not visualized. Aortic valve mean gradient measures  36.0 mmHg. Aortic valve peak gradient measures 64.0 mmHg.   Pulmonic Valve: The pulmonic valve was normal in structure. Pulmonic valve  regurgitation is not visualized.   Aorta: The aortic root and ascending aorta are structurally normal, with  no evidence of dilitation.   IAS/Shunts: No atrial level shunt detected by color flow Doppler.     LEFT VENTRICLE  PLAX 2D  LVIDd:         4.20 cm         Diastology  LVIDs:         2.80 cm         LV e' medial:    4.46 cm/s  LV PW:         1.00 cm         LV E/e' medial:  17.9  LV IVS:        1.00 cm         LV e' lateral:   5.44 cm/s  LVOT diam:     1.90 cm         LV E/e' lateral: 14.7  LVOT Area:     2.84 cm                                   3D Volume EF  LV Volumes (MOD)               LV 3D EF:    Left  LV vol d, MOD    55.2 ml                    ventricul  A2C:                                        ar  LV vol d, MOD    48.5 ml                    ejection  A4C:                                        fraction  LV vol s, MOD  30.9 ml                    by 3D  A2C:                                        volume is  LV vol s, MOD    31.6 ml                    40 %.  A4C:  LV SV MOD A2C:   24.3 ml  LV SV MOD A4C:   48.5 ml       3D Volume EF:  LV SV MOD BP:     23.3 ml       3D EF:        40 %                                 LV EDV:       103 ml                                 LV ESV:       62 ml                                 LV SV:        41 ml   RIGHT VENTRICLE             IVC  RV Basal diam:  2.70 cm     IVC diam: 1.50 cm  RV S prime:     18.80 cm/s  TAPSE (M-mode): 1.6 cm   LEFT ATRIUM             Index        RIGHT ATRIUM          Index  LA diam:        3.30 cm 2.39 cm/m   RA Area:     7.17 cm  LA Vol (A2C):   49.9 ml 36.18 ml/m  RA Volume:   12.10 ml 8.77 ml/m  LA Vol (A4C):   49.3 ml 35.75 ml/m  LA Biplane Vol: 49.8 ml 36.11 ml/m   AORTIC VALVE  AV Vmax:      400.00 cm/s  AV Vmean:     276.000 cm/s  AV VTI:       0.717 m  AV Peak Grad: 64.0 mmHg  AV Mean Grad: 36.0 mmHg    AORTA  Ao Root diam: 2.50 cm   MITRAL VALVE               TRICUSPID VALVE  MV Area (PHT): 5.75 cm    TR Peak grad:   33.9 mmHg  MV Decel Time: 132 msec    TR Vmax:        291.00 cm/s  MR Peak grad: 192.7 mmHg  MR Mean grad: 112.0 mmHg   SHUNTS  MR Vmax:      694.00 cm/s  Systemic Diam: 1.90 cm  MR Vmean:     494.0 cm/s  MV E velocity: 79.80 cm/s  MV A velocity: 83.90 cm/s  MV E/A ratio:  0.95   CARDIAC CATH Right and  left heart catheterization 05/14/2023: Hemodynamic data: RA/4, mean 2 mmHg. RV 29/-4, EDP 3 mmHg. PA 30/11, mean 21 mmHg. PW 16/13, mean 11 mmHg. PA saturation 69%, AO saturation 95%. QP/QS 1.00.  PVR 183 dynes per second. CO 4.38/CI 3.16.   LV 134/25, EDP 19 mmHg.  Ao 105/60, mean 71 mmHg. Peak to peak aortic valve gradient/LVOT gradient of 59 mmHg with a mean of 28.5 mmHg, aortic valve area calculated at 0.67 cm with dimensionless index 0.48 most consistent with LVOT gradient.   Angiographic data: RCA: Dominant, smooth and normal. LM: Large-caliber vessel, smooth and normal. LAD: Gives origin to a very large D1.  LAD is severely tortuous in the midsegment.  Otherwise smooth and normal. RI: Large vessel, again tortuous  but smooth and normal. CX: Gives origin to a high OM1 which is large and again severely tortuous and a moderate size OM 2 and continues as OM 3 after giving a small AV groove branch.  Smooth and normal.    Impression: Normal right heart catheterization.  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   Patient remained tachycardic throughout, orders for metoprolol succinate placed, needs optimization of heart failure therapy.  CARDIAC MRI  Antimicrobials:  Anti-infectives (From admission, onward)    None       Subjective: Seen and examined at bedside and she doing fairly well.  No nausea or vomiting.  Going for a cardiac MRI later today.  No chest pain or shortness of breath.  No other concerns or complaints this time.   Objective: Vitals:   05/14/23 2357 05/15/23 0502 05/15/23 0740 05/15/23 1049  BP: 92/63 (!) 99/59    Pulse: 91 94 100   Resp: 16   17  Temp: 98.1 F (36.7 C) 98.1 F (36.7 C)    TempSrc: Oral Oral Oral Oral  SpO2: 95% 98% 100%   Weight:  44.4 kg    Height:        Intake/Output Summary (Last 24 hours) at 05/15/2023 1218 Last data filed at 05/15/2023 0945 Gross per 24 hour  Intake 977 ml  Output 1050 ml  Net -73 ml   Filed Weights   05/13/23 1721 05/14/23 0547 05/15/23 0502  Weight: 45.8 kg 44.5 kg 44.4 kg   Examination: Physical Exam:  Constitutional: Thin Caucasian female in NAD Respiratory: Diminished to auscultation bilaterally, no wheezing, rales, rhonchi or crackles. Normal respiratory effort and patient is not tachypenic. No accessory muscle use. Unlabored breathing  Cardiovascular: RRR, no murmurs / rubs / gallops. S1 and S2 auscultated. No extremity edema. Abdomen: Soft, non-tender, non-distended. Bowel sounds positive.  GU: Deferred. Musculoskeletal: No clubbing  / cyanosis of digits/nails. No joint deformity upper and lower extremities.  Skin: No rashes, lesions, ulcers on a limited skin evaluation. No induration; Warm and dry.  Neurologic: CN 2-12 grossly intact with no focal deficits. Romberg sign and cerebellar reflexes not assessed.  Psychiatric: Normal judgment and insight. Alert and oriented x 3. Normal mood and appropriate affect.   Data Reviewed: I have personally reviewed following labs and imaging studies  CBC: Recent Labs  Lab 05/13/23 0008 05/13/23 0110 05/13/23 0237 05/13/23 2048 05/14/23 0247 05/14/23 1006 05/14/23 1007 05/14/23 1019 05/15/23 0233  WBC 12.3*  --  12.9* 7.9 7.8  --   --   --  7.0  NEUTROABS 9.9*  --   --   --   --   --   --   --   --  HGB 12.5   < > 11.9* 11.7* 11.9* 11.6* 11.6* 11.2* 12.0  HCT 35.8*   < > 33.9* 33.2* 33.8* 34.0* 34.0* 33.0* 34.3*  MCV 88.2  --  86.5 84.9 85.1  --   --   --  86.0  PLT 315  --  272 264 250  --   --   --  241   < > = values in this interval not displayed.   Basic Metabolic Panel: Recent Labs  Lab 05/13/23 0008 05/13/23 0110 05/13/23 0920 05/13/23 2049 05/14/23 0247 05/14/23 0853 05/14/23 1006 05/14/23 1007 05/14/23 1019 05/15/23 0233  NA 117*   < > 121* 119* 124* 125* 125* 125* 126* 129*  K 4.1   < > 4.3 4.0 4.3 4.0 4.0 4.0 3.9 4.2  CL 88*   < > 94* 91* 95* 93*  --   --   --  99  CO2 20*  --  21* 22 24 23   --   --   --  25  GLUCOSE 138*   < > 111* 108* 103* 106*  --   --   --  102*  BUN 15   < > 12 16 11 11   --   --   --  11  CREATININE 0.86   < > 0.78 0.85 0.93 0.92  --   --   --  1.00  CALCIUM 8.4*  --  8.3* 8.3* 8.2* 8.7*  --   --   --  8.4*  MG 1.8  --   --   --  1.9  --   --   --   --  2.1   < > = values in this interval not displayed.   GFR: Estimated Creatinine Clearance: 33 mL/min (by C-G formula based on SCr of 1 mg/dL). Liver Function Tests: Recent Labs  Lab 05/13/23 0008  AST 28  ALT 10  ALKPHOS 42  BILITOT 1.0  PROT 5.3*  ALBUMIN 3.2*    No results for input(s): "LIPASE", "AMYLASE" in the last 168 hours. No results for input(s): "AMMONIA" in the last 168 hours. Coagulation Profile: No results for input(s): "INR", "PROTIME" in the last 168 hours. Cardiac Enzymes: No results for input(s): "CKTOTAL", "CKMB", "CKMBINDEX", "TROPONINI" in the last 168 hours. BNP (last 3 results) No results for input(s): "PROBNP" in the last 8760 hours. HbA1C: Recent Labs    05/13/23 0237  HGBA1C 5.2   CBG: No results for input(s): "GLUCAP" in the last 168 hours. Lipid Profile: No results for input(s): "CHOL", "HDL", "LDLCALC", "TRIG", "CHOLHDL", "LDLDIRECT" in the last 72 hours. Thyroid Function Tests: Recent Labs    05/13/23 0237  TSH 2.423   Anemia Panel: No results for input(s): "VITAMINB12", "FOLATE", "FERRITIN", "TIBC", "IRON", "RETICCTPCT" in the last 72 hours. Sepsis Labs: No results for input(s): "PROCALCITON", "LATICACIDVEN" in the last 168 hours.  No results found for this or any previous visit (from the past 240 hour(s)).   Radiology Studies: CARDIAC CATHETERIZATION  Result Date: 05/14/2023 Images from the original result were not included. Right and left heart catheterization 05/14/2023: Hemodynamic data: RA/4, mean 2 mmHg. RV 29/-4, EDP 3 mmHg. PA 30/11, mean 21 mmHg. PW 16/13, mean 11 mmHg. PA saturation 69%, AO saturation 95%. QP/QS 1.00.  PVR 183 dynes per second. CO 4.38/CI 3.16. LV 134/25, EDP 19 mmHg.  Ao 105/60, mean 71 mmHg. Peak to peak aortic valve gradient/LVOT gradient of 59 mmHg with a mean of 28.5 mmHg, aortic valve area calculated  at 0.67 cm with dimensionless index 0.48 most consistent with LVOT gradient. Angiographic data: RCA: Dominant, smooth and normal. LM: Large-caliber vessel, smooth and normal. LAD: Gives origin to a very large D1.  LAD is severely tortuous in the midsegment.  Otherwise smooth and normal. RI: Large vessel, again tortuous but smooth and normal. CX: Gives origin to a high OM1 which  is large and again severely tortuous and a moderate size OM 2 and continues as OM 3 after giving a small AV groove branch.  Smooth and normal. Impression: Normal right heart catheterization.  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 Patient remained tachycardic throughout, orders for metoprolol succinate placed, needs optimization of heart failure therapy.   ECHOCARDIOGRAM COMPLETE  Result Date: 05/14/2023    ECHOCARDIOGRAM REPORT   Patient Name:   Martha Swanson Date of Exam: 05/14/2023 Medical Rec #:  [id number]        Height:       60.0 in Accession #:    [id number]       Weight:       98.1 lb Date of Birth:  29-Jul-1945        BSA:          1.379 m Patient Age:    39 years         BP:           105/55 mmHg Patient Gender: F                HR:           100 bpm. Exam Location:  Inpatient Procedure: 2D Echo, 3D Echo, Cardiac Doppler, Color Doppler and Strain Analysis Indications:    Nonischemic Cardiomyopathy  History:        Patient has no prior history of Echocardiogram examinations.                 Risk Factors:Hypertension and Dyslipidemia.  Sonographer:    Karma Ganja Referring Phys: [id number] [place]  Sonographer Comments: Global longitudinal strain was attempted. IMPRESSIONS  1. Wall motion abnormalities appear consistent with takotsubo cardiomyopathy or potentially a large anterior infarct. There is a dynamic flow acceleration in the LVOT with associated SAM. Peak gradient of . Left ventricular ejection fraction, by estimation, is 40 to 45%. Left ventricular ejection fraction by 3D volume is 40 %. The left ventricle has mildly decreased function. The left ventricle demonstrates regional wall motion abnormalities (see scoring diagram/findings for description). There is mild asymmetric left ventricular  hypertrophy of the basal-septal segment. Left ventricular diastolic parameters are consistent with Grade II diastolic dysfunction (pseudonormalization). Hyperdynamic basal segments and akinesis of the mid-distal and apical segments.  2. Right ventricular systolic function is normal. The right ventricular size is normal. There is mildly elevated pulmonary artery systolic pressure.  3. Left atrial size was moderately dilated.  4. The mitral valve is normal in structure. Moderate to severe mitral valve regurgitation.  5. There is elevated gradients across the aortic valve but the shape of the jet and appearance of the valve are more consistent with dynamic LVOT obstruction as opposed to true aortic stenosis. The aortic valve is normal in structure. Aortic valve regurgitation is not visualized. Aortic valve mean gradient measures 36.0 mmHg. Aortic valve Vmax measures 4.00 m/s. FINDINGS  Left Ventricle: Wall motion abnormalities appear consistent with takotsubo cardiomyopathy or  potentially a large anterior infarct. There is a dynamic flow acceleration in the LVOT with associated SAM. Peak gradient of . Left ventricular ejection fraction, by estimation, is 40 to 45%. Left ventricular ejection fraction by 3D volume is 40 %. The left ventricle has mildly decreased function. The left ventricle demonstrates regional wall motion abnormalities. The left ventricular internal cavity size was normal in size. There is mild asymmetric left ventricular hypertrophy of the basal-septal segment. Left ventricular diastolic parameters are consistent with Grade II diastolic dysfunction (pseudonormalization).  LV Wall Scoring: The mid and distal anterior wall, mid and distal anterior septum, mid and distal inferior wall, mid inferoseptal segment, and apex are akinetic. The mid and distal lateral wall is hypokinetic. The antero-lateral wall, basal anteroseptal segment, basal inferolateral segment, basal anterior segment, basal  inferior segment, and basal inferoseptal segment are normal. Hyperdynamic basal segments and akinesis of the mid-distal and apical segments. Right Ventricle: The right ventricular size is normal. No increase in right ventricular wall thickness. Right ventricular systolic function is normal. There is mildly elevated pulmonary artery systolic pressure. The tricuspid regurgitant velocity is 2.91  m/s, and with an assumed right atrial pressure of 3 mmHg, the estimated right ventricular systolic pressure is 36.9 mmHg. Left Atrium: Left atrial size was moderately dilated. Right Atrium: Right atrial size was normal in size. Pericardium: There is no evidence of pericardial effusion. Mitral Valve: There is systolic anterior motion of the mitral valve. The mitral valve is normal in structure. There is mild thickening of the mitral valve leaflet(s). Moderate to severe mitral valve regurgitation. Tricuspid Valve: The tricuspid valve is normal in structure. Tricuspid valve regurgitation is mild. Aortic Valve: There is elevated gradients across the aortic valve but the shape of the jet and appearance of the valve are more consistent with dynamic LVOT obstruction as opposed to true aortic stenosis. The aortic valve is normal in structure. Aortic valve regurgitation is not visualized. Aortic valve mean gradient measures 36.0 mmHg. Aortic valve peak gradient measures 64.0 mmHg. Pulmonic Valve: The pulmonic valve was normal in structure. Pulmonic valve regurgitation is not visualized. Aorta: The aortic root and ascending aorta are structurally normal, with no evidence of dilitation. IAS/Shunts: No atrial level shunt detected by color flow Doppler.  LEFT VENTRICLE PLAX 2D LVIDd:         4.20 cm         Diastology LVIDs:         2.80 cm         LV e' medial:    4.46 cm/s LV PW:         1.00 cm         LV E/e' medial:  17.9 LV IVS:        1.00 cm         LV e' lateral:   5.44 cm/s LVOT diam:     1.90 cm         LV E/e' lateral: 14.7  LVOT Area:     2.84 cm                                 3D Volume EF LV Volumes (MOD)               LV 3D EF:    Left LV vol d, MOD    55.2 ml  ventricul A2C:                                        ar LV vol d, MOD    48.5 ml                    ejection A4C:                                        fraction LV vol s, MOD    30.9 ml                    by 3D A2C:                                        volume is LV vol s, MOD    31.6 ml                    40 %. A4C: LV SV MOD A2C:   24.3 ml LV SV MOD A4C:   48.5 ml       3D Volume EF: LV SV MOD BP:    23.3 ml       3D EF:        40 %                                LV EDV:       103 ml                                LV ESV:       62 ml                                LV SV:        41 ml RIGHT VENTRICLE             IVC RV Basal diam:  2.70 cm     IVC diam: 1.50 cm RV S prime:     18.80 cm/s TAPSE (M-mode): 1.6 cm LEFT ATRIUM             Index        RIGHT ATRIUM          Index LA diam:        3.30 cm 2.39 cm/m   RA Area:     7.17 cm LA Vol (A2C):   49.9 ml 36.18 ml/m  RA Volume:   12.10 ml 8.77 ml/m LA Vol (A4C):   49.3 ml 35.75 ml/m LA Biplane Vol: 49.8 ml 36.11 ml/m  AORTIC VALVE AV Vmax:      400.00 cm/s AV Vmean:     276.000 cm/s AV VTI:       0.717 m AV Peak Grad: 64.0 mmHg AV Mean Grad: 36.0 mmHg  AORTA Ao Root diam: 2.50 cm MITRAL VALVE               TRICUSPID VALVE MV Area (PHT): 5.75 cm    TR Peak grad:  33.9 mmHg MV Decel Time: 132 msec    TR Vmax:        291.00 cm/s MR Peak grad: 192.7 mmHg MR Mean grad: 112.0 mmHg   SHUNTS MR Vmax:      694.00 cm/s  Systemic Diam: 1.90 cm MR Vmean:     494.0 cm/s MV E velocity: 79.80 cm/s MV A velocity: 83.90 cm/s MV E/A ratio:  0.95 Clearnce Hasten Electronically signed by Clearnce Hasten Signature Date/Time: 05/14/2023/9:12:03 AM    Final     Scheduled Meds:  empagliflozin  10 mg Oral Daily   furosemide  20 mg Oral BID   heparin injection (subcutaneous)  5,000 Units Subcutaneous Q8H    levothyroxine  50 mcg Oral Q0600   rosuvastatin  5 mg Oral Daily   sodium chloride flush  3 mL Intravenous Q12H   sodium chloride flush  3 mL Intravenous Q12H   Continuous Infusions:   LOS: 2 days   Marguerita Merles, DO [facility] Available via Epic secure chat 7am-7pm After these hours, please refer to coverage provider listed on [email or web address] 05/15/2023, 12:18 PM

## 2023-05-15 NOTE — Plan of Care (Signed)
  Problem: Education: Goal: Knowledge of General Education information will improve Description: Including pain rating scale, medication(s)/side effects and non-pharmacologic comfort measures Outcome: Adequate for Discharge   Problem: Health Behavior/Discharge Planning: Goal: Ability to manage health-related needs will improve Outcome: Adequate for Discharge   Problem: Clinical Measurements: Goal: Ability to maintain clinical measurements within normal limits will improve Outcome: Adequate for Discharge Goal: Will remain free from infection Outcome: Adequate for Discharge Goal: Diagnostic test results will improve Outcome: Adequate for Discharge Goal: Respiratory complications will improve Outcome: Adequate for Discharge Goal: Cardiovascular complication will be avoided Outcome: Adequate for Discharge   Problem: Activity: Goal: Risk for activity intolerance will decrease Outcome: Adequate for Discharge   Problem: Nutrition: Goal: Adequate nutrition will be maintained Outcome: Adequate for Discharge   Problem: Coping: Goal: Level of anxiety will decrease Outcome: Adequate for Discharge   Problem: Elimination: Goal: Will not experience complications related to bowel motility Outcome: Adequate for Discharge Goal: Will not experience complications related to urinary retention Outcome: Adequate for Discharge   Problem: Pain Management: Goal: General experience of comfort will improve Outcome: Adequate for Discharge   Problem: Safety: Goal: Ability to remain free from injury will improve Outcome: Adequate for Discharge   Problem: Skin Integrity: Goal: Risk for impaired skin integrity will decrease Outcome: Adequate for Discharge   Problem: Education: Goal: Understanding of CV disease, CV risk reduction, and recovery process will improve Outcome: Adequate for Discharge Goal: Individualized Educational Video(s) Outcome: Adequate for Discharge   Problem:  Activity: Goal: Ability to return to baseline activity level will improve Outcome: Adequate for Discharge   Problem: Cardiovascular: Goal: Ability to achieve and maintain adequate cardiovascular perfusion will improve Outcome: Adequate for Discharge Goal: Vascular access site(s) Level 0-1 will be maintained Outcome: Adequate for Discharge   Problem: Health Behavior/Discharge Planning: Goal: Ability to safely manage health-related needs after discharge will improve Outcome: Adequate for Discharge

## 2023-05-15 NOTE — Discharge Summary (Signed)
Discharge Summary    Patient ID: Martha Swanson MRN: [id number]; DOB: 03/23/1946  Admit date: 05/12/2023 Discharge date: 05/15/2023  PCP:  [name], MD   [facility] Providers Cardiologist:  None   {    Discharge Diagnoses    Principal Problem:   Decompensated heart failure [facility])    Diagnostic Studies/Procedures    Cardiac MRI 05/15/23 :  FINDINGS: Moderate LAE. Normal RA/RV. No significant pericardial effusion. Mitral valve is abnormal and thickened with mild posterior leaflet prolapse and SAM with systolic contact (SAM) with hypertrophied basal septum at 13 mm. Normal ascending thoracic aorta 2.8 cm Moderate appearing MR. There is moderate LVE There is global hypokinesis of the mid/apical wall segments and hyperdynamic activity of the basal LV myocardium Findings consistent with Takatsubo cardiomyopathy. There is turbulence in the LVOT from [facility]. Flow analysis not done in this area to estimate gradient. Delayed enhancement images showed no scar, infarct or infiltration   Quantitative LVEF: 38% (EDV 109 cc ESV 67 cc SV 41 cc) Estimated cardiac output 3.2 L/min   Quantitative RVEF: 56% (EDV 44 cc ESV 19 cc SV 24 cc )   MR regurgitant fraction based on LV stroke volume and flow analysis through the aortic valve was 15%   Parametric Measures: Using Hct of 34   T1: Mildly elevated 1099 msec   ECV: Mildly elevated 29%   T2: Normal 50 msec   IMPRESSION: 1. Moderate LVE with mid/apical wall hypokinesis consistent with Takatsubo DCM with basal wall hyperdynamic LVEF 38%   2. Delayed gadolinium images with no scar, infarct or infiltration. With normal coronary cath also suggestive of stress induced cardiomyopathy.   3.  Normal RV size and function RVEF 56%   4. Physiologic LVOT obstruction suggest echo correlation. SAM with abnormal thickened MV and mild posterior leaflet prolapse. MR regurgitant fraction 15%. Septal thickness 13  mm. Suspect this is not true HOCM and may improve as RWMA and EF does with Takatsubo.   5.  Moderate LAE   6. Mildly elevated T1 and ECV likely more reflecting edema from SEMI and stress induced process.   7.  Estimated cardiac output 3.2 L/min    R/L heart cath 05/14/23 showed:  Right and left heart catheterization 05/14/2023: Hemodynamic data: RA/4, mean 2 mmHg. RV 29/-4, EDP 3 mmHg. PA 30/11, mean 21 mmHg. PW 16/13, mean 11 mmHg. PA saturation 69%, AO saturation 95%. QP/QS 1.00.  PVR 183 dynes per second. CO 4.38/CI 3.16.   LV 134/25, EDP 19 mmHg.  Ao 105/60, mean 71 mmHg. Peak to peak aortic valve gradient/LVOT gradient of 59 mmHg with a mean of 28.5 mmHg, aortic valve area calculated at 0.67 cm with dimensionless index 0.48 most consistent with LVOT gradient.   Angiographic data: RCA: Dominant, smooth and normal. LM: Large-caliber vessel, smooth and normal. LAD: Gives origin to a very large D1.  LAD is severely tortuous in the midsegment.  Otherwise smooth and normal. RI: Large vessel, again tortuous but smooth and normal. CX: Gives origin to a high OM1 which is large and again severely tortuous and a moderate size OM 2 and continues as OM 3 after giving a small AV groove branch.  Smooth and normal.    Impression: Normal right heart catheterization.  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   Patient remained tachycardic throughout, orders  for metoprolol succinate placed, needs optimization of heart failure therapy.  Echo from 05/14/23:   1. Wall motion abnormalities appear consistent with takotsubo  cardiomyopathy or potentially a large anterior infarct. There is a dynamic  flow acceleration in the LVOT with associated SAM. Peak gradient of  . Left ventricular ejection fraction,  by  estimation, is 40 to 45%. Left ventricular ejection fraction by 3D volume  is 40 %. The left ventricle has mildly decreased function. The left  ventricle demonstrates regional wall motion abnormalities (see scoring  diagram/findings for description). There  is mild asymmetric left ventricular hypertrophy of the basal-septal  segment. Left ventricular diastolic parameters are consistent with Grade  II diastolic dysfunction (pseudonormalization). Hyperdynamic basal  segments and akinesis of the mid-distal and  apical segments.   2. Right ventricular systolic function is normal. The right ventricular  size is normal. There is mildly elevated pulmonary artery systolic  pressure.   3. Left atrial size was moderately dilated.   4. The mitral valve is normal in structure. Moderate to severe mitral  valve regurgitation.   5. There is elevated gradients across the aortic valve but the shape of  the jet and appearance of the valve are more consistent with dynamic LVOT  obstruction as opposed to true aortic stenosis. The aortic valve is normal  in structure. Aortic valve  regurgitation is not visualized. Aortic valve mean gradient measures 36.0  mmHg. Aortic valve Vmax measures 4.00 m/s.      History of Present Illness     Per admission H&P 05/13/2023 by Dr.Aggarwal:  Martha Swanson is a 77 y.o. female with hx stress cardiomyopathy in 2012, who was seen 05/13/2023 for the evaluation of weakness and shortness of breath.   [name] was in her usual state of health until last week when she began feeling an insidious onset of weakness because of which she was not able to complete her routine tasks. She also started experiencing some shortness of breath and had some vague episodes of dull left shoulder pain. She denied orthopnea, PND, bendopnea, and has no LE edema. Her CP was intiially a 5/10 05/12/23 and improved to a 2/10 by 05/13/23. It was non-exertional but improved with NTG. Her primary  complaint was that she has not been able to sleep and feels weak and tired because of that.    She notably has a hx of stress cardiomyopathy in 2012 and she says her symptoms now are similar to those that she had at that time as well.    Hospital Course     Consultants: Internal medicine consulted for hyponatremia   Acute systolic heart failure Stress-induced cardiomyopathy NSTEMI type II Mitral regurgitation -Patient presented with 1 week onset of weakness and dyspnea and left shoulder pain -Hs trop  1312 >1588 -Echo from 05/14/2023 showed wall motion abnormality consistent with Takotsubo cardiomyopathy or potentially a large anterior infarct, there is dynamic flow acceleration in the LVOT with associated SAM, peak gradient 1 one 1 mmHg.  LVEF 40 to 45%, mild asymmetric LVH of basal septal segment, grade 2 DD, Hyperdynamic basal segments and akinesis of the mid-distal and apical segments.  Normal RV. Mild elevated PASP. Mod LAE. Mod to severe MR. Elevated gradients across the aortic valve but the shape of the jet and appearance of the valve are more consistent with dynamic LVOT obstruction as opposed to true aortic stenosis. Mean gradient 36 mmHg. Vmax 4 m/s.  - R/L heart cath 05/14/23 revealing normal RHC, pressure  gradient across the LVOT/aortic valve as noted above most consistent with probably LVOT obstruction then aortic valve stenosis, normal but severely tortuous coronary arteries suggestive of hypertensive heart disease.  -Cardiac MRI today revealed moderate LVE with mid/apical wall hypokinesis consistent with Takotsubo DCM with basal wall hyperdynamic LVEF 38%, delayed gadolinium images with no scar, infarct or infiltration.  With normal coronary cath also suggestive of stress-induced cardiomyopathy.  Normal RV size and function RVEF 56%.  Physiological LVOT obstruction such as echo correlation.  SAM with abnormal thickened MV and mild posterior leaflet prolapse.  MR regurgitant fraction  15%.  Septal thickness 13 mm.  Suspect this is not true HOCM and may improve as RWMA and EF dose with Takotsubo.  Moderate LAE. Mildly elevated T1 and ECV likely more reflecting edema from SEMI and stress induced process.Estimated cardiac output 3.2 L/min  -Status post IV diuresis, recommend transition to p.o. Lasix 20 mg every other day by Dr. Servando Salina today - GDMT: Blood pressure has been low 90s systolic, recommend reduce Toprol-XL to 12.5 mg daily, started Jardiance 10 mg daily, no blood pressure room to add additional therapy -Patient is recommended discharge to home today by Dr Servando Salina -Post heart cath instructions reviewed with patient, new medication sent to her pharmacy -Follow-up appointment has been arranged with [facility] heart failure impact on 05/29/2023 and [place] line office 06/04/2023  Hyponatremia  -Hospitalist was consulted regarding hyponatremia as low as 117, urine osmolality 286, patient was ruled out SIADH, hyponatremia was felt due to low salt intake as well as CHF exacerbation, sodium has improved to 129 today with diuresis -Consider repeat BMP at next follow-up visit  Generalized weakness -Felt this was due to CHF exacerbation, PT has evaluated patient and recommended home health  Hypothyroidism -TSH WNL, continue PTA levothyroxine dose         Did the patient have an acute coronary syndrome (MI, NSTEMI, STEMI, etc) this admission?:  No                             Discharge Vitals Blood pressure (!) 95/57, pulse (!) 101, temperature 98.2 F (36.8 C), temperature source Oral, resp. rate 16, height 5' (1.524 m), weight 44.4 kg, SpO2 98%.  Filed Weights   05/13/23 1721 05/14/23 0547 05/15/23 0502  Weight: 45.8 kg 44.5 kg 44.4 kg   See attending progress note today for exam  Labs & Radiologic Studies    CBC Recent Labs    05/13/23 0008 05/13/23 0110 05/14/23 0247 05/14/23 1006 05/14/23 1019 05/15/23 0233  WBC 12.3*   < > 7.8  --   --  7.0  NEUTROABS 9.9*  --    --   --   --   --   HGB 12.5   < > 11.9*   < > 11.2* 12.0  HCT 35.8*   < > 33.8*   < > 33.0* 34.3*  MCV 88.2   < > 85.1  --   --  86.0  PLT 315   < > 250  --   --  241   < > = values in this interval not displayed.   Basic Metabolic Panel Recent Labs    19/14/78 0247 05/14/23 0853 05/14/23 1006 05/14/23 1019 05/15/23 0233  NA 124* 125*   < > 126* 129*  K 4.3 4.0   < > 3.9 4.2  CL 95* 93*  --   --  99  CO2 24  23  --   --  25  GLUCOSE 103* 106*  --   --  102*  BUN 11 11  --   --  11  CREATININE 0.93 0.92  --   --  1.00  CALCIUM 8.2* 8.7*  --   --  8.4*  MG 1.9  --   --   --  2.1   < > = values in this interval not displayed.   Liver Function Tests Recent Labs    05/13/23 0008  AST 28  ALT 10  ALKPHOS 42  BILITOT 1.0  PROT 5.3*  ALBUMIN 3.2*   No results for input(s): "LIPASE", "AMYLASE" in the last 72 hours. High Sensitivity Troponin:   Recent Labs  Lab 05/13/23 0008 05/13/23 0237  TROPONINIHS 1,312* 1,588*    BNP Invalid input(s): "POCBNP" D-Dimer No results for input(s): "DDIMER" in the last 72 hours. Hemoglobin A1C Recent Labs    05/13/23 0237  HGBA1C 5.2   Fasting Lipid Panel No results for input(s): "CHOL", "HDL", "LDLCALC", "TRIG", "CHOLHDL", "LDLDIRECT" in the last 72 hours. Thyroid Function Tests Recent Labs    05/13/23 0237  TSH 2.423   _____________  MR CARDIAC VELOCITY FLOW MAP  Result Date: 05/15/2023 CLINICAL DATA:  Takatsubo Cardiomyopathy EXAM: CARDIAC MRI TECHNIQUE: The patient was scanned on a 1.5 Tesla Siemens magnet. A dedicated cardiac coil was used. Functional imaging was done using Fiesta sequences. 2,3, and 4 chamber views were done to assess for RWMA's. Modified Simpson's rule using a short axis stack was used to calculate an ejection fraction on a dedicated work [employer]. The patient received 8 cc of Gadavist. After 10 minutes inversion recovery sequences were used to assess for infiltration and scar  tissue. CONTRAST:  Gadavist FINDINGS: Moderate LAE. Normal RA/RV. No significant pericardial effusion. Mitral valve is abnormal and thickened with mild posterior leaflet prolapse and SAM with systolic contact (SAM) with hypertrophied basal septum at 13 mm. Normal ascending thoracic aorta 2.8 cm Moderate appearing MR. There is moderate LVE There is global hypokinesis of the mid/apical wall segments and hyperdynamic activity of the basal LV myocardium Findings consistent with Takatsubo cardiomyopathy. There is turbulence in the LVOT from [facility]. Flow analysis not done in this area to estimate gradient. Delayed enhancement images showed no scar, infarct or infiltration Quantitative LVEF: 38% (EDV 109 cc ESV 67 cc SV 41 cc) Estimated cardiac output 3.2 L/min Quantitative RVEF: 56% (EDV 44 cc ESV 19 cc SV 24 cc ) MR regurgitant fraction based on LV stroke volume and flow analysis through the aortic valve was 15% Parametric Measures: Using Hct of 34 T1: Mildly elevated 1099 msec ECV: Mildly elevated 29% T2: Normal 50 msec IMPRESSION: 1.  Estimated cardiac output 3.2 L/min Charlton Haws Electronically Signed   By: Charlton Haws M.D.   On: 05/15/2023 16:16   MR CARDIAC MORPHOLOGY W WO CONTRAST  Result Date: 05/15/2023 CLINICAL DATA:  Takatsubo Cardiomyopathy with Hypertrophic Physiology EXAM: CARDIAC MRI TECHNIQUE: The patient was scanned on a 1.5 Tesla Siemens magnet. A dedicated cardiac coil was used. Functional imaging was done using Fiesta sequences. 2,3, and 4 chamber views were done to assess for RWMA's. Modified Simpson's rule using a short axis stack was used to calculate an ejection fraction on a dedicated work [employer]. The patient received 8 cc of Gadavist. After 10 minutes inversion recovery sequences were used to assess for infiltration and scar tissue. CONTRAST:  Gadavist FINDINGS: Moderate LAE. Normal RA/RV.  No significant pericardial effusion. Mitral valve is abnormal and thickened with  mild posterior leaflet prolapse and SAM with systolic contact (SAM) with hypertrophied basal septum at 13 mm. Normal ascending thoracic aorta 2.8 cm Moderate appearing MR. There is moderate LVE There is global hypokinesis of the mid/apical wall segments and hyperdynamic activity of the basal LV myocardium Findings consistent with Takatsubo cardiomyopathy. There is turbulence in the LVOT from [facility]. Flow analysis not done in this area to estimate gradient. Delayed enhancement images showed no scar, infarct or infiltration Quantitative LVEF: 38% (EDV 109 cc ESV 67 cc SV 41 cc) Estimated cardiac output 3.2 L/min Quantitative RVEF: 56% (EDV 44 cc ESV 19 cc SV 24 cc ) MR regurgitant fraction based on LV stroke volume and flow analysis through the aortic valve was 15% Parametric Measures: Using Hct of 34 T1: Mildly elevated 1099 msec ECV: Mildly elevated 29% T2: Normal 50 msec IMPRESSION: 1. Moderate LVE with mid/apical wall hypokinesis consistent with Takatsubo DCM with basal wall hyperdynamic LVEF 38% 2. Delayed gadolinium images with no scar, infarct or infiltration. With normal coronary cath also suggestive of stress induced cardiomyopathy. 3.  Normal RV size and function RVEF 56% 4. Physiologic LVOT obstruction suggest echo correlation. SAM with abnormal thickened MV and mild posterior leaflet prolapse. MR regurgitant fraction 15%. Septal thickness 13 mm. Suspect this is not true HOCM and may improve as RWMA and EF does with Takatsubo. 5.  Moderate LAE 6. Mildly elevated T1 and ECV likely more reflecting edema from SEMI and stress induced process. 7.  Estimated cardiac output 3.2 L/min Charlton Haws Electronically Signed   By: Charlton Haws M.D.   On: 05/15/2023 16:14   CARDIAC CATHETERIZATION  Result Date: 05/14/2023 Images from the original result were not included. Right and left heart catheterization 05/14/2023: Hemodynamic data: RA/4, mean 2 mmHg. RV 29/-4, EDP 3 mmHg. PA 30/11, mean 21 mmHg. PW 16/13, mean 11  mmHg. PA saturation 69%, AO saturation 95%. QP/QS 1.00.  PVR 183 dynes per second. CO 4.38/CI 3.16. LV 134/25, EDP 19 mmHg.  Ao 105/60, mean 71 mmHg. Peak to peak aortic valve gradient/LVOT gradient of 59 mmHg with a mean of 28.5 mmHg, aortic valve area calculated at 0.67 cm with dimensionless index 0.48 most consistent with LVOT gradient. Angiographic data: RCA: Dominant, smooth and normal. LM: Large-caliber vessel, smooth and normal. LAD: Gives origin to a very large D1.  LAD is severely tortuous in the midsegment.  Otherwise smooth and normal. RI: Large vessel, again tortuous but smooth and normal. CX: Gives origin to a high OM1 which is large and again severely tortuous and a moderate size OM 2 and continues as OM 3 after giving a small AV groove branch.  Smooth and normal. Impression: Normal right heart catheterization.  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 Patient remained tachycardic throughout, orders for metoprolol succinate placed, needs optimization of heart failure therapy.   ECHOCARDIOGRAM COMPLETE  Result Date: 05/14/2023    ECHOCARDIOGRAM REPORT   Patient Name:   KATEY ARCH Date of Exam: 05/14/2023 Medical Rec #:  [id number]        Height:       60.0 in Accession #:    [id number]       Weight:       98.1 lb Date of Birth:  Feb 07, 1946  BSA:          1.379 m Patient Age:    77 years         BP:           105/55 mmHg Patient Gender: F                HR:           100 bpm. Exam Location:  Inpatient Procedure: 2D Echo, 3D Echo, Cardiac Doppler, Color Doppler and Strain Analysis Indications:    Nonischemic Cardiomyopathy  History:        Patient has no prior history of Echocardiogram examinations.                 Risk Factors:Hypertension and Dyslipidemia.  Sonographer:    Karma Ganja Referring Phys:  [id number] [place]  Sonographer Comments: Global longitudinal strain was attempted. IMPRESSIONS  1. Wall motion abnormalities appear consistent with takotsubo cardiomyopathy or potentially a large anterior infarct. There is a dynamic flow acceleration in the LVOT with associated SAM. Peak gradient of . Left ventricular ejection fraction, by estimation, is 40 to 45%. Left ventricular ejection fraction by 3D volume is 40 %. The left ventricle has mildly decreased function. The left ventricle demonstrates regional wall motion abnormalities (see scoring diagram/findings for description). There is mild asymmetric left ventricular hypertrophy of the basal-septal segment. Left ventricular diastolic parameters are consistent with Grade II diastolic dysfunction (pseudonormalization). Hyperdynamic basal segments and akinesis of the mid-distal and apical segments.  2. Right ventricular systolic function is normal. The right ventricular size is normal. There is mildly elevated pulmonary artery systolic pressure.  3. Left atrial size was moderately dilated.  4. The mitral valve is normal in structure. Moderate to severe mitral valve regurgitation.  5. There is elevated gradients across the aortic valve but the shape of the jet and appearance of the valve are more consistent with dynamic LVOT obstruction as opposed to true aortic stenosis. The aortic valve is normal in structure. Aortic valve regurgitation is not visualized. Aortic valve mean gradient measures 36.0 mmHg. Aortic valve Vmax measures 4.00 m/s. FINDINGS  Left Ventricle: Wall motion abnormalities appear consistent with takotsubo cardiomyopathy or potentially a large anterior infarct. There is a dynamic flow acceleration in the LVOT with associated SAM. Peak gradient of . Left ventricular ejection fraction, by estimation, is 40 to 45%. Left ventricular ejection fraction by 3D volume is 40 %. The left ventricle has mildly decreased function. The left  ventricle demonstrates regional wall motion abnormalities. The left ventricular internal cavity size was normal in size. There is mild asymmetric left ventricular hypertrophy of the basal-septal segment. Left ventricular diastolic parameters are consistent with Grade II diastolic dysfunction (pseudonormalization).  LV Wall Scoring: The mid and distal anterior wall, mid and distal anterior septum, mid and distal inferior wall, mid inferoseptal segment, and apex are akinetic. The mid and distal lateral wall is hypokinetic. The antero-lateral wall, basal anteroseptal segment, basal inferolateral segment, basal anterior segment, basal inferior segment, and basal inferoseptal segment are normal. Hyperdynamic basal segments and akinesis of the mid-distal and apical segments. Right Ventricle: The right ventricular size is normal. No increase in right ventricular wall thickness. Right ventricular systolic function is normal. There is mildly elevated pulmonary artery systolic pressure. The tricuspid regurgitant velocity is 2.91  m/s, and with an assumed right atrial pressure of 3 mmHg, the estimated right ventricular systolic pressure is 36.9 mmHg. Left Atrium: Left atrial size was moderately dilated. Right Atrium:  Right atrial size was normal in size. Pericardium: There is no evidence of pericardial effusion. Mitral Valve: There is systolic anterior motion of the mitral valve. The mitral valve is normal in structure. There is mild thickening of the mitral valve leaflet(s). Moderate to severe mitral valve regurgitation. Tricuspid Valve: The tricuspid valve is normal in structure. Tricuspid valve regurgitation is mild. Aortic Valve: There is elevated gradients across the aortic valve but the shape of the jet and appearance of the valve are more consistent with dynamic LVOT obstruction as opposed to true aortic stenosis. The aortic valve is normal in structure. Aortic valve regurgitation is not visualized. Aortic valve mean  gradient measures 36.0 mmHg. Aortic valve peak gradient measures 64.0 mmHg. Pulmonic Valve: The pulmonic valve was normal in structure. Pulmonic valve regurgitation is not visualized. Aorta: The aortic root and ascending aorta are structurally normal, with no evidence of dilitation. IAS/Shunts: No atrial level shunt detected by color flow Doppler.  LEFT VENTRICLE PLAX 2D LVIDd:         4.20 cm         Diastology LVIDs:         2.80 cm         LV e' medial:    4.46 cm/s LV PW:         1.00 cm         LV E/e' medial:  17.9 LV IVS:        1.00 cm         LV e' lateral:   5.44 cm/s LVOT diam:     1.90 cm         LV E/e' lateral: 14.7 LVOT Area:     2.84 cm                                 3D Volume EF LV Volumes (MOD)               LV 3D EF:    Left LV vol d, MOD    55.2 ml                    ventricul A2C:                                        ar LV vol d, MOD    48.5 ml                    ejection A4C:                                        fraction LV vol s, MOD    30.9 ml                    by 3D A2C:                                        volume is LV vol s, MOD    31.6 ml                    40 %. A4C: LV SV MOD  A2C:   24.3 ml LV SV MOD A4C:   48.5 ml       3D Volume EF: LV SV MOD BP:    23.3 ml       3D EF:        40 %                                LV EDV:       103 ml                                LV ESV:       62 ml                                LV SV:        41 ml RIGHT VENTRICLE             IVC RV Basal diam:  2.70 cm     IVC diam: 1.50 cm RV S prime:     18.80 cm/s TAPSE (M-mode): 1.6 cm LEFT ATRIUM             Index        RIGHT ATRIUM          Index LA diam:        3.30 cm 2.39 cm/m   RA Area:     7.17 cm LA Vol (A2C):   49.9 ml 36.18 ml/m  RA Volume:   12.10 ml 8.77 ml/m LA Vol (A4C):   49.3 ml 35.75 ml/m LA Biplane Vol: 49.8 ml 36.11 ml/m  AORTIC VALVE AV Vmax:      400.00 cm/s AV Vmean:     276.000 cm/s AV VTI:       0.717 m AV Peak Grad: 64.0 mmHg AV Mean Grad: 36.0 mmHg  AORTA Ao Root diam:  2.50 cm MITRAL VALVE               TRICUSPID VALVE MV Area (PHT): 5.75 cm    TR Peak grad:   33.9 mmHg MV Decel Time: 132 msec    TR Vmax:        291.00 cm/s MR Peak grad: 192.7 mmHg MR Mean grad: 112.0 mmHg   SHUNTS MR Vmax:      694.00 cm/s  Systemic Diam: 1.90 cm MR Vmean:     494.0 cm/s MV E velocity: 79.80 cm/s MV A velocity: 83.90 cm/s MV E/A ratio:  0.95 Clearnce Hasten Electronically signed by Clearnce Hasten Signature Date/Time: 05/14/2023/9:12:03 AM    Final    DG Chest Portable 1 View  Result Date: 05/13/2023 CLINICAL DATA:  Chest pain radiating down left side of arm. Intermittent nausea, shortness of breath, weakness, and fatigue. EXAM: PORTABLE CHEST 1 VIEW COMPARISON:  02/05/2011. FINDINGS: The heart size and mediastinal contours are within normal limits. There is atherosclerotic calcification of the aorta. Interstitial prominence is noted bilaterally with scattered airspace disease in the mid to lower lung fields. Kerley B-lines are seen bilaterally. There is a small right pleural effusion. No pneumothorax is seen. No acute osseous abnormality. IMPRESSION: 1. Findings compatible with pulmonary edema. 2. Small right pleural effusion. Electronically Signed   By: Thornell Sartorius M.D.   On: 05/13/2023 00:37   Disposition   Patient is seen by Dr. Ditmore Aschoff  today , deemed stable for discharge to home .  Post-cath care, medication change, follow-up plan reviewed in detail via phone .  All questions answered to satisfaction.  New medication has been sent to her pharmacy at [facility] as [facility] pharmacy has closed . Pt is being discharged home today in good condition.  Follow-up Plans & Appointments     Follow-up Information     [facility]. Go in 13 day(s).   Specialty: Cardiology Why: Hospital follow up 05/29/2023 @ 11 am PLEASE bring a current medication list to appointment FREE valet parking,Entrance [address], off [employer] information: [address] [place] [address] [phone]        [facility] at [facility]. Call.   Specialty: Rehabilitation Why: Call the Outpatient Center and follow up regarding Outpatient therapy needs. Contact information: [address] [facility] [facility] [place] [address] [phone]               Discharge Instructions     Amb Referral to Cardiac Rehabilitation   Complete by: As directed    Diagnosis: Type II MI   After initial evaluation and assessments completed: Virtual Based Care may be provided alone or in conjunction with Phase 2 Cardiac Rehab based on patient barriers.: Yes   Intensive Cardiac Rehabilitation (ICR) [facility] location only OR Traditional Cardiac Rehabilitation (TCR) *If criteria for ICR are not met will enroll in TCR [facility] only): Yes   Ambulatory referral to Physical Therapy   Complete by: As directed    Iontophoresis - 4 mg/ml of dexamethasone: No   T.E.N.S. Unit Evaluation and Dispense as Indicated: No   Diet - low sodium heart healthy   Complete by: As directed    Discharge instructions   Complete by: As directed    Radial Site Care  Refer to this sheet in the next few weeks. These instructions provide you with information on caring for yourself after your procedure. Your caregiver may also give you more specific instructions. Your treatment has been planned according to current medical practices, but problems sometimes occur. Call your caregiver if you have any problems or questions after your procedure.  HOME CARE INSTRUCTIONS You may shower the day after the procedure. Remove the bandage (dressing) and gently wash the site with plain soap and water. Gently pat the site dry.  Do not apply powder or lotion to the site.  Do not submerge the affected site in water for 3 to 5 days.  Inspect the site at least twice daily.  Do not flex or bend the affected arm for 24 hours.  No lifting over 5 pounds (2.3 kg) for 5  days after your procedure.  Do not drive home if you are discharged the same day of the procedure. Have someone else drive you.  You may drive 24 hours after the procedure unless otherwise instructed by your caregiver.   What to expect: Any bruising will usually fade within 1 to 2 weeks.  Blood that collects in the tissue (hematoma) may be painful to the touch. It should usually decrease in size and tenderness within 1 to 2 weeks.   SEEK IMMEDIATE MEDICAL CARE IF: You have unusual pain at the radial site.  You have redness, warmth, swelling, or pain at the radial site.  You have drainage (other than a small amount of blood on the dressing).  You have chills.  You have a fever or persistent symptoms  for more than 72 hours.  You have a fever and your symptoms suddenly get worse.  Your arm becomes pale, cool, tingly, or numb.  You have heavy bleeding from the site. Hold pressure on the site.     HEART FAILURE INSTRUCTIONS   Follow a low salt diet which means you need to consume less than 2 grams (2000mg ) of sodium per day.  Check the labels!  You'll be surprised to see how much sodium is in common foods and drinks.  DO NOT EAT foods high in salt, such as salted nuts, crackers, smoked fish, fast food, deli meats, salami, pepperoni, jerky, prosciutto, ham, bacon, soups, prepared frozen dinners.  DO NOT add salt to your food when cooking or at the table.  It is ok to use other seasonings and flavors as long as they are salt free.    Drink less than 8 cups (which is ~2036ml or 2 liters) of fluid per day.  This is equivalent to ~ 4 bottles of water a day.  Weigh yourself every morning before breakfast and write it down in a log.  Bring in your record to every clinic visit.  Check for swelling in your feet, ankles, legs and stomach every morning.  Take an extra dose of your fluid pill once a day for worsening shortness of breath, swelling, or >3lb weight gain in 24 hours or >5lb weight gain in 1  week.  Control your blood pressure.  If you have a blood pressure cuff, record your blood pressure & heart rate daily. Bring the record to [facility] clinic with you.  Goal BP is <140/90  Control your blood sugars if you are diabetic  Control your cholesterol.  The goal LDL (bad cholesterol) is <70  Lose weight if you are overweight  Exercise as much as you are able to strengthen your heart  Take your medicine the way you are instructed   Bring ALL your medicines and medication list to [facility] CLINIC VISIT clinic   Get a yearly flu shot to reduce your risk of the flu   If you are under age 28 you need the Pneumovax pneumonia vaccine as a one time shot to reduce the risk of pneumonia   If you are 55 yrs or older you need the Prevnar pneumonia vaccine as a one time shot followed by a Pneumovax pneumonia vaccine > 1 year later as a one time shot to help reduce your risk of pneumonia   Call nurse if you have trouble paying for your medications, are running low on pills, having trouble with transportation or if you are gaining weight quickly, having significant swelling, trouble breathing, chest pain, dizziness or fainting spells.            New medication:  Lasix 20mg  every other day  Metoprolol XL 12.5mg  daily  Jardiance 10mg  daily   Stop lisinopril   Please monitor your blood pressure daily, bring your records to next follow-up visit for review   Increase activity slowly   Complete by: As directed         Discharge Medications   Allergies as of 05/15/2023       Reactions   Penicillins Anaphylaxis        Medication List     STOP taking these medications    lisinopril 10 MG tablet Commonly known as: ZESTRIL       TAKE these medications    calcium carbonate 600 MG Tabs tablet Commonly known as: OS-CAL Take  1 tablet (600 mg total) by mouth 2 (two) times daily with a meal.   empagliflozin 10 MG Tabs tablet Commonly known as: JARDIANCE Take 1 tablet (10 mg  total) by mouth daily. Start taking on: May 16, 2023   furosemide 20 MG tablet Commonly known as: LASIX Take 1 tablet (20 mg total) by mouth every other day. Start taking on: May 17, 2023   levothyroxine 50 MCG tablet Commonly known as: SYNTHROID TAKE 1 TABLET BY MOUTH EVERY DAY BEFORE BREAKFAST   metoprolol succinate 25 MG 24 hr tablet Commonly known as: TOPROL-XL Take 0.5 tablets (12.5 mg total) by mouth daily.   multivitamin with minerals Tabs tablet Take 1 tablet by mouth daily.           Outstanding Labs/Studies     Duration of Discharge Encounter   Greater than 30 minutes including physician time.  Signed, Cyndi Bender, NP 05/15/2023, 5:38 PM

## 2023-05-16 LAB — LIPOPROTEIN A (LPA): Lipoprotein (a): 41.2 nmol/L — ABNORMAL HIGH (ref <75.0)

## 2023-05-17 ENCOUNTER — Telehealth: Payer: Self-pay

## 2023-05-17 NOTE — Transitions of Care (Post Inpatient/ED Visit) (Signed)
   05/17/2023  Name: Martha Swanson MRN: [id number] DOB: 1946-05-04  Today's TOC FU Call Status: Today's TOC FU Call Status:: Successful TOC FU Call Completed TOC FU Call Complete Date: 05/17/23 Patient's Name and Date of Birth confirmed.  Transition Care Management Follow-up Telephone Call Date of Discharge: 05/15/23 Discharge Facility[facility]) Type of Discharge: Inpatient Admission Primary Inpatient Discharge Diagnosis:: Decompensated Heart Failure How have you been since you were released from the hospital?: Better ("I am feeling much, much better" "No longer short of breath and no chest pain, just a little weak") Any questions or concerns?: No  Items Reviewed: Did you receive and understand the discharge instructions provided?: Yes Medications obtained,verified, and reconciled?: Yes (Medications Reviewed) Any new allergies since your discharge?: No Dietary orders reviewed?: Yes Type of Diet Ordered:: Low Sodium, Heart Healthy Do you have support at home?: Yes People in Home: spouse Name of Support/Comfort Primary Source: Spouse  Medications Reviewed Today: Medications Reviewed Today     Reviewed by Wyline Mood, RN (Case Manager) on 05/17/23 at 1252  Med List Status: <None>   Medication Order Taking? Sig Documenting Provider Last Dose Status Informant  calcium carbonate (OS-CAL) 600 MG TABS tablet [id number] Yes Take 1 tablet (600 mg total) by mouth 2 (two) times daily with a meal. [name], MD Taking Active Self  empagliflozin (JARDIANCE) 10 MG TABS tablet [id number] Yes Take 1 tablet (10 mg total) by mouth daily. Cyndi Bender, NP Taking Active   furosemide (LASIX) 20 MG tablet [id number] Yes Take 1 tablet (20 mg total) by mouth every other day. Cyndi Bender, NP Taking Active   levothyroxine (SYNTHROID) 50 MCG tablet [id number] Yes TAKE 1 TABLET BY MOUTH EVERY DAY BEFORE BREAKFAST [name], MD Taking Active Self  metoprolol succinate  (TOPROL-XL) 25 MG 24 hr tablet [id number] Yes Take 0.5 tablets (12.5 mg total) by mouth daily. Cyndi Bender, NP Taking Active   Multiple Vitamin (MULTIVITAMIN WITH MINERALS) TABS tablet [id number] Yes Take 1 tablet by mouth daily. [provider] Taking Active Self            Home Care and Equipment/Supplies: Were Home Health Services Ordered?: No Any new equipment or medical supplies ordered?: No  Functional Questionnaire: Do you need assistance with bathing/showering or dressing?: No Do you need assistance with meal preparation?: No Do you need assistance with eating?: No Do you have difficulty maintaining continence: No Do you need assistance with getting out of bed/getting out of a chair/moving?: No Do you have difficulty managing or taking your medications?: No  Follow up appointments reviewed: PCP Follow-up appointment confirmed?: Yes Date of PCP follow-up appointment?: 07/11/23 Follow-up Provider: Dr. Doreene Burke Specialist [facility] Follow-up appointment confirmed?: Yes Date of Specialist follow-up appointment?: 05/29/23 Follow-Up Specialty Provider:: [facility] Do you need transportation to your follow-up appointment?: No Do you understand care options if your condition(s) worsen?: Yes-patient verbalized understanding  SDOH Interventions Today    Flowsheet Row Most Recent Value  SDOH Interventions   Food Insecurity Interventions Intervention Not Indicated  Housing Interventions Intervention Not Indicated  Transportation Interventions Intervention Not Indicated  Utilities Interventions Intervention Not Indicated       [name] Daphine Deutscher BSN, RN RN Care Manager   Transitions of Care [facility] - [facility] Direct Dial Number:  [phone]

## 2023-05-21 ENCOUNTER — Telehealth (HOSPITAL_COMMUNITY): Payer: Self-pay

## 2023-05-21 NOTE — Telephone Encounter (Signed)
Called and spoke with pt in regards to CR, pt stated she is not interested at this time b/c she goes to the [facility]. Did provide pt with information about CR.   Closed referral

## 2023-05-29 ENCOUNTER — Encounter (HOSPITAL_COMMUNITY): Payer: Self-pay

## 2023-05-29 ENCOUNTER — Ambulatory Visit (HOSPITAL_COMMUNITY)
Admit: 2023-05-29 | Discharge: 2023-05-29 | Disposition: A | Discharge status: Home / Self Care | Payer: Medicare Other | Source: Ambulatory Visit | Attending: Physician Assistant | Admitting: Physician Assistant

## 2023-05-29 VITALS — BP 130/70 | HR 75 | Wt 92.0 lb

## 2023-05-29 DIAGNOSIS — I10 Essential (primary) hypertension: Secondary | ICD-10-CM

## 2023-05-29 DIAGNOSIS — I5022 Chronic systolic (congestive) heart failure: Secondary | ICD-10-CM

## 2023-05-29 DIAGNOSIS — E871 Hypo-osmolality and hyponatremia: Secondary | ICD-10-CM | POA: Diagnosis not present

## 2023-05-29 DIAGNOSIS — I110 Hypertensive heart disease with heart failure: Secondary | ICD-10-CM | POA: Insufficient documentation

## 2023-05-29 DIAGNOSIS — R9431 Abnormal electrocardiogram [ECG] [EKG]: Secondary | ICD-10-CM | POA: Insufficient documentation

## 2023-05-29 DIAGNOSIS — I5181 Takotsubo syndrome: Secondary | ICD-10-CM | POA: Diagnosis not present

## 2023-05-29 LAB — BASIC METABOLIC PANEL
Anion gap: 5 (ref 5–15)
BUN: 16 mg/dL (ref 8–23)
CO2: 25 mmol/L (ref 22–32)
Calcium: 10.2 mg/dL (ref 8.9–10.3)
Chloride: 105 mmol/L (ref 98–111)
Creatinine, Ser: 0.92 mg/dL (ref 0.44–1.00)
GFR, Estimated: >60 mL/min (ref >60)
Glucose, Bld: 96 mg/dL (ref 70–99)
Potassium: 3.9 mmol/L (ref 3.5–5.1)
Sodium: 135 mmol/L (ref 135–145)

## 2023-05-29 LAB — BRAIN NATRIURETIC PEPTIDE: B Natriuretic Peptide: 237.5 pg/mL — ABNORMAL HIGH (ref 0.0–100.0)

## 2023-05-29 MED ORDER — FUROSEMIDE 20 MG PO TABS: 20.0000 mg | ORAL_TABLET | Freq: Every day | ORAL | 2 refills | Status: DC | PRN

## 2023-05-29 MED ORDER — LOSARTAN POTASSIUM 25 MG PO TABS: 12.5000 mg | ORAL_TABLET | Freq: Every day | ORAL | 3 refills | Status: DC

## 2023-05-29 NOTE — Progress Notes (Signed)
Medication Samples have been provided to the patient.  Drug name: Jardiance       Strength: 10mg         Qty: 4  LOT: [id number]  Exp.Date: 04/2025  Dosing instructions: take 1 tab po qd  The patient has been instructed regarding the correct time, dose, and frequency of taking this medication, including desired effects and most common side effects.   Mozelle Remlinger R Shakira Los 11:34 AM 05/29/2023

## 2023-05-29 NOTE — Progress Notes (Signed)
Cardiology Office Note:  .   Date:  06/04/2023  ID:  BRESLYNN BURDETTE, DOB [birth year], MRN [id number] PCP: [name], MD  [facility] Providers Cardiologist:  Dr.Tobb }   History of Present Illness: .   LYNIAH VALAZQUEZ is a 77 y.o. 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 Marland KitchenMod to severe MR.   Elevated gradients across the aortic valve but the shape of the jet and appearance of the valve are more consistent with dynamic LVOT obstruction as opposed to true aortic stenosis. Mean gradient 36 mmHg. Vmax 4 m/s. She was started on Jardiacne, p.o. Lasix 20 mg daily, Toprol was reduced to 12.5 XL daily.  She was also sent home with physical therapy and home health.  Weight on discharge 44.4 kg.  She comes today without any cardiac complaints.  She is weighed herself daily and has maintain weight between 91 and 92 pounds consistently.  Her blood pressure has been ranging from as low as 91/41 to as high as 123/59, with an average blood pressure of 118/58.  Heart rate has been running between 78 and 80.  She is tolerating the furosemide which she takes as needed and has not had to take it for over a week.  She continues to be compliant with Jardiance 10 mg losartan 25 mg and metoprolol 12.5 mg daily.  She states she is feeling much better she is back to walking a mile a day at the [facility] and is doing her best to reduce her stress.  ROS: As above otherwise negative.  Studies Reviewed: [name]    R/L heart cath 05/14/23 showed:   Right and left heart catheterization 05/14/2023: Hemodynamic data: RA/4, mean 2 mmHg. RV 29/-4, EDP 3 mmHg. PA 30/11, mean 21 mmHg. PW 16/13, mean 11 mmHg. PA saturation 69%, AO saturation 95%. QP/QS 1.00.  PVR 183 dynes per second. CO 4.38/CI 3.16.   LV 134/25, EDP 19 mmHg.  Ao 105/60, mean 71 mmHg. Peak to peak aortic valve  gradient/LVOT gradient of 59 mmHg with a mean of 28.5 mmHg, aortic valve area calculated at 0.67 cm with dimensionless index 0.48 most consistent with LVOT gradient.   Angiographic data: RCA: Dominant, smooth and normal. LM: Large-caliber vessel, smooth and normal. LAD: Gives origin to a very large D1.  LAD is severely tortuous in the midsegment.  Otherwise smooth and normal. RI: Large vessel, again tortuous but smooth and normal. CX: Gives origin to a high OM1 which is large and again severely tortuous and a moderate size OM 2 and continues as OM 3 after giving a small AV groove branch.  Smooth and normal.    Impression: Normal right heart catheterization.  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   EKG Interpretation Date/Time: 06/04/2023    Ventricular Rate: 68   PR Interval: 132 ms    QRS Duration:    QT Interval:    QTC Calculation: 463 ms   R Axis:      Text Interpretation:   Voltage criteria for left ventricular hypertrophy, ST abnormality with inferior subendocardial injury, anterior subendocardial injury.  (Unchanged from previous EKG).   Physical Exam:   VS:  BP 112/72   Pulse 68   Ht 4\' 11"  (1.499 m)  Wt 92 lb 9.6 oz (42 kg)   SpO2 96%   BMI 18.70 kg/m    Wt Readings from Last 3 Encounters:  06/04/23 92 lb 9.6 oz (42 kg)  05/29/23 92 lb (41.7 kg)  05/15/23 97 lb 14.2 oz (44.4 kg)    GEN: Well nourished, well developed in no acute distress NECK: No JVD; No carotid bruits CARDIAC: RRR, no current murmurs, rubs, gallops RESPIRATORY:  Clear to auscultation without rales, wheezing or rhonchi  ABDOMEN: Soft, non-tender, non-distended EXTREMITIES:  No edema; No deformity   ASSESSMENT AND PLAN: .   Takotsubo's cardiomyopathy: Patient is feeling much better on addition of losartan 25  mg daily continues on metoprolol 12.5 mg daily and Jardiance 10 mg daily.  Weight has been stable blood pressure has been stable she is back to exercising and doing her best to reduce her stress level.  She is denying any symptoms at this time.  Right and left heart catheterization as above with nonobstructive CAD.  2.  Decompensated systolic CHF: This was in the setting of Takotsubo cardiomyopathy.  The patient's weight has been stable she is taking Lasix as needed only.  She denies any new symptoms.  Continue GDMT.  3.  Moderate to severe MR: This was revealed per right heart cath with moderate to severe MR and elevated gradients across aortic valve but the shape of the jet and appearance of the valve are consistent with dynamic LVOT obstruction.  Continue blood pressure control on losartan.  Will have her follow-up in 3 months with Dr. Tawanna Cooler for ongoing evaluation and possible repeat echocardiogram.      Signed, Bettey Mare. Liborio Nixon, ANP, AACC

## 2023-05-29 NOTE — Patient Instructions (Signed)
Labs done today. We will contact you only if your labs are abnormal.  DECREASE Lasix to 20mg  (1 tablet) by mouth daily as needed.   START Losartan 12.5mg  (1/2 tablet) by mouth daily.   No other medication changes were made. Please continue all current medications as prescribed.  Thank you for allowing [place] to provide your heart failure care after your recent hospitalization. Please follow-up with General Cardiology.

## 2023-05-29 NOTE — Progress Notes (Signed)
HEART & VASCULAR TRANSITION OF CARE CONSULT NOTE     Referring Physician: Dr. Dion Body Primary Care: Dr. Doreene Burke Primary Cardiologist: Dr. Servando Salina  HPI: Referred to clinic by Dr. Servando Salina with [facility] for heart failure consultation. 77 y.o. female with history of hypothyroidism, HTN and prior hx of Takotsubo cardiomyopathy in 2012.   Presented with acute onset chest pain w/ elevated troponin, acute CHF and hyponatremia 05/13/23. Echo with EF 40-45%, WMA consistent with takotsubo cardiomyopathy vs anterior infarct, dynamic flow acceleration in LVOT associated with SAM and peak gradient of 111 mmHg, grade II DD, RV okay, moderate to severe MR, mean gradient of 36 mmHg across aortic valve (appearance of jet seems more c/w with dynamic LVOT obstruction).   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  She was diuresed. GDMT limited by blood pressure.  Here today for hospital follow-up. She is accompanied by her husband who assists with he history. She has been feeling well since discharge. No chest pain, palpitations, dyspnea, orthopnea, PND or lower extremity edema. She is back to walking about 1 mile a day at the [facility]. She is tolerating all medications well. BP has been averaging 110s/60s-70s. Watching fluid and sodium intake closely.    Past Medical History:  Diagnosis Date   Heart disease    Takotsubo cardiomyopathy     cardiac catheterization initial ejection fraction 20% no coronary artery disease ejection fraction improved after 5 days    Unspecified  hypothyroidism     Current Outpatient Medications  Medication Sig Dispense Refill   calcium carbonate (OS-CAL) 600 MG TABS tablet Take 1 tablet (600 mg total) by mouth 2 (two) times daily with a meal. 60 tablet 12   empagliflozin (JARDIANCE) 10 MG TABS tablet Take 1 tablet (10 mg total) by mouth daily. 30 tablet 2   levothyroxine (SYNTHROID) 50 MCG tablet TAKE 1 TABLET BY MOUTH EVERY DAY BEFORE BREAKFAST 90 tablet 2   losartan (COZAAR) 25 MG tablet Take 0.5 tablets (12.5 mg total) by mouth daily. 45 tablet 3   metoprolol succinate (TOPROL-XL) 25 MG 24 hr tablet Take 0.5 tablets (12.5 mg total) by mouth daily. 15 tablet 2   Multiple Vitamin (MULTIVITAMIN WITH MINERALS) TABS tablet Take 1 tablet by mouth daily.     furosemide (LASIX) 20 MG tablet Take 1 tablet (20 mg total) by mouth daily as needed for fluid or edema. 30 tablet 2   No current facility-administered medications for this encounter.    Allergies  Allergen Reactions   Penicillins Anaphylaxis      Social History   Socioeconomic History   Marital status: Married    Spouse name: Bill   Number of children: 2   Years of education: Not on file   Highest education level: Associate degree: academic program  Occupational History    Comment: Research scientist (physical sciences)   Occupation: Retired  Tobacco Use   Smoking status: Never   Smokeless tobacco: Never  Vaping Use   Vaping status: Never Used  Substance and Sexual Activity   Alcohol use: Yes    Comment: occasional wine  Drug use: No   Sexual activity: Not on file  Other Topics Concern   Not on file  Social History Narrative   Not on file   Social Determinants of Health   Financial Resource Strain: Low Risk  (05/14/2023)   Overall Financial Resource Strain (CARDIA)    Difficulty of Paying Living Expenses: Not hard at all  Food Insecurity: No Food Insecurity (05/17/2023)   Hunger Vital Sign    Worried About Running Out of Food in the Last Year: Never true    Ran Out of  Food in the Last Year: Never true  Transportation Needs: No Transportation Needs (05/17/2023)   PRAPARE - Administrator, Civil Service (Medical): No    Lack of Transportation (Non-Medical): No  Physical Activity: Insufficiently Active (01/09/2023)   Exercise Vital Sign    Days of Exercise per Week: 4 days    Minutes of Exercise per Session: 30 min  Stress: No Stress Concern Present (01/09/2023)   [employer] of Occupational Health - Occupational Stress Questionnaire    Feeling of Stress : Only a little  Social Connections: Socially Integrated (01/09/2023)   Social Connection and Isolation Panel [NHANES]    Frequency of Communication with Friends and Family: Twice a week    Frequency of Social Gatherings with Friends and Family: Once a week    Attends Religious Services: More than 4 times per year    Active Member of [employer] or Organizations: Yes    Attends Engineer, structural: More than 4 times per year    Marital Status: Married  Catering manager Violence: Not At Risk (05/17/2023)   Humiliation, Afraid, Rape, and Kick questionnaire    Fear of Current or Ex-Partner: No    Emotionally Abused: No    Physically Abused: No    Sexually Abused: No      Family History  Problem Relation Age of Onset   Arthritis Mother    COPD Mother    Hypertension Mother    Heart attack Father 30   Early death Father    Breast cancer Maternal Aunt 40   Cancer Maternal Grandfather    Arthritis Brother    Asthma Brother    Hypertension Brother    Arthritis Brother    Colon cancer Neg Hx     Vitals:   05/29/23 1034  BP: 130/70  Pulse: 75  SpO2: 99%  Weight: 41.7 kg (92 lb)    PHYSICAL EXAM: General:  Well appearing elderly female HEENT: normal Neck: supple. no JVD.  Cor: Regular rate & rhythm. No rubs, gallops or murmurs. Lungs: clear Abdomen: soft, nontender, nondistended.  Extremities: no cyanosis, clubbing, rash, edema Neuro: alert & oriented x 3. moves all  4 extremities w/o difficulty. Affect pleasant.  ECG: SR 75 bpm, diffuse ST changes/T wave inversions (not significantly changed from ECGs during admission last month)   ASSESSMENT & PLAN: HFmrEF/Takostubo cardiomyopathy -Prior hx Takotsubo cardiomyopathy in 2012 with recovery in LV function -Echo 11/24: EF 40-45%, WMA c/w takotsubo vs anterior infarct, dynamic flow acceleration in LVOT associated with SAM and peak gradient of 111 mmHg, grade II DD, RV okay, moderate to severe MR, mean gradient of 36 mmHg across aortic valve (appearance of jet seems more c/w with dynamic LVOT obstruction).  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 Volume looks good. Decrease lasix to PRN. -Continue Jardiance 10 mg daily, given samples to get her through until her new plan starts in January. -Continue Toprol xl 12.5 mg daily -Add losartan 12.5 mg daily -BMET/BNP today, suggest repeat BMET at f/u with Cardiology on 12/17 -Consider repeat echo in 4-6 weeks to reassess LV function. Suspect LVOT gradient and SAM d/t Takotsubo cardiomyopathy, but will need to rule out HOCM if present on subsequent study  2. HTN -BP controlled today -Meds as above -She is limiting sodium intake. Continue regular exercise.  3. Hyponatremia -SIADH ruled out during recent admission -Felt to be hypervolemic hypernatremia -Na up to 129 day of discharge -Check labs today   Referred to HFSW (PCP, Medications, Transportation, ETOH Abuse, Drug Abuse, Insurance, Financial ): No Refer to Pharmacy: No Refer to Home Health: No Refer to Advanced Heart Failure Clinic:  No Refer to General Cardiology: No, already established  Follow up  As needed, follow-up with Cardiology as scheduled 12/17

## 2023-06-04 ENCOUNTER — Encounter: Payer: Self-pay | Admitting: Adult Health

## 2023-06-04 ENCOUNTER — Ambulatory Visit: Payer: Medicare Other | Attending: Adult Health | Admitting: Adult Health

## 2023-06-04 VITALS — BP 112/72 | HR 68 | Ht 59.0 in | Wt 92.6 lb

## 2023-06-04 DIAGNOSIS — Q248 Other specified congenital malformations of heart: Secondary | ICD-10-CM | POA: Diagnosis not present

## 2023-06-04 DIAGNOSIS — I10 Essential (primary) hypertension: Secondary | ICD-10-CM | POA: Diagnosis not present

## 2023-06-04 DIAGNOSIS — I5022 Chronic systolic (congestive) heart failure: Secondary | ICD-10-CM | POA: Diagnosis not present

## 2023-06-04 MED ORDER — EMPAGLIFLOZIN 10 MG PO TABS: 10.0000 mg | ORAL_TABLET | Freq: Every day | ORAL | 2 refills | Status: DC

## 2023-06-04 NOTE — Patient Instructions (Signed)
Medication Instructions:  No changes *If you need a refill on your cardiac medications before your next appointment, please call your pharmacy*   Lab Work: No Labs If you have labs (blood work) drawn today and your tests are completely normal, you will receive your results only by: MyChart Message (if you have MyChart) OR A paper copy in the mail If you have any lab test that is abnormal or we need to change your treatment, we will call you to review the results.   Testing/Procedures: No Testing   Follow-Up: At [facility], you and your health needs are our priority.  As part of our continuing mission to provide you with exceptional heart care, we have created designated Provider Care Teams.  These Care Teams include your primary Cardiologist (physician) and Advanced Practice Providers (APPs -  Physician Assistants and Nurse Practitioners) who all work together to provide you with the care you need, when you need it.  We recommend signing up for the patient portal called "MyChart".  Sign up information is provided on this After Visit Summary.  MyChart is used to connect with patients for Virtual Visits (Telemedicine).  Patients are able to view lab/test results, encounter notes, upcoming appointments, etc.  Non-urgent messages can be sent to your provider as well.   To learn more about what you can do with MyChart, go to [email or web address].    Your next appointment:   3 month(s)  Provider:   [name], DO

## 2023-07-11 ENCOUNTER — Encounter: Payer: Self-pay | Admitting: Family Medicine

## 2023-07-11 ENCOUNTER — Ambulatory Visit (INDEPENDENT_AMBULATORY_CARE_PROVIDER_SITE_OTHER): Payer: Medicare Other | Admitting: Family Medicine

## 2023-07-11 VITALS — BP 118/64 | HR 71 | Temp 97.6°F | Ht 59.0 in | Wt 92.6 lb

## 2023-07-11 DIAGNOSIS — E039 Hypothyroidism, unspecified: Secondary | ICD-10-CM

## 2023-07-11 DIAGNOSIS — E871 Hypo-osmolality and hyponatremia: Secondary | ICD-10-CM | POA: Diagnosis not present

## 2023-07-11 DIAGNOSIS — Z09 Encounter for follow-up examination after completed treatment for conditions other than malignant neoplasm: Secondary | ICD-10-CM

## 2023-07-11 DIAGNOSIS — I10 Essential (primary) hypertension: Secondary | ICD-10-CM | POA: Diagnosis not present

## 2023-07-11 LAB — COMPREHENSIVE METABOLIC PANEL
ALT: 16 U/L (ref 0–35)
AST: 16 U/L (ref 0–37)
Albumin: 4.3 g/dL (ref 3.5–5.2)
Alkaline Phosphatase: 55 U/L (ref 39–117)
BUN: 13 mg/dL (ref 6–23)
CO2: 26 meq/L (ref 19–32)
Calcium: 9.2 mg/dL (ref 8.4–10.5)
Chloride: 105 meq/L (ref 96–112)
Creatinine, Ser: 0.79 mg/dL (ref 0.40–1.20)
GFR: 71.99 mL/min (ref >60.00)
Glucose, Bld: 89 mg/dL (ref 70–99)
Potassium: 3.8 meq/L (ref 3.5–5.1)
Sodium: 138 meq/L (ref 135–145)
Total Bilirubin: 0.7 mg/dL (ref 0.2–1.2)
Total Protein: 6.6 g/dL (ref 6.0–8.3)

## 2023-07-11 NOTE — Progress Notes (Signed)
Established Patient Office Visit   Subjective:  Patient ID: Martha Swanson, female    DOB: [birth year]  Age: 78 y.o. MRN: [id number]  Chief Complaint  Patient presents with   Medical Management of Chronic Issues    6 month follow up.     HPI Encounter Diagnoses  Name Primary?   Hospital discharge follow-up Yes   Acquired hypothyroidism    Essential hypertension    Hospital discharge follow-up for Takotsubo cardiomyopathy with CHF.  Much improved.  Has started exercising by walking a mile.  No shortness of breath.  Sleeps on 1 pillow.  No swelling in her lower extremities.  No chest pain.  She is taking the Lasix twice and she has been at home.  Blood pressure well-controlled with current regimen.  Hypothyroidism controlled with current regimen.   Review of Systems  Constitutional: Negative.   HENT: Negative.    Eyes:  Negative for blurred vision, discharge and redness.  Respiratory: Negative.  Negative for shortness of breath.   Cardiovascular: Negative.  Negative for chest pain.  Gastrointestinal:  Negative for abdominal pain.  Genitourinary: Negative.   Musculoskeletal: Negative.  Negative for myalgias.  Skin:  Negative for rash.  Neurological:  Negative for tingling, loss of consciousness and weakness.  Endo/Heme/Allergies:  Negative for polydipsia.     Current Outpatient Medications:    calcium carbonate (OS-CAL) 600 MG TABS tablet, Take 1 tablet (600 mg total) by mouth 2 (two) times daily with a meal., Disp: 60 tablet, Rfl: 12   empagliflozin (JARDIANCE) 10 MG TABS tablet, Take 1 tablet (10 mg total) by mouth daily., Disp: 90 tablet, Rfl: 2   furosemide (LASIX) 20 MG tablet, Take 1 tablet (20 mg total) by mouth daily as needed for fluid or edema., Disp: 30 tablet, Rfl: 2   levothyroxine (SYNTHROID) 50 MCG tablet, TAKE 1 TABLET BY MOUTH EVERY DAY BEFORE BREAKFAST, Disp: 90 tablet, Rfl: 2   losartan (COZAAR) 25 MG tablet, Take 0.5 tablets (12.5 mg total) by mouth  daily., Disp: 45 tablet, Rfl: 3   metoprolol succinate (TOPROL-XL) 25 MG 24 hr tablet, Take 0.5 tablets (12.5 mg total) by mouth daily., Disp: 15 tablet, Rfl: 2   Multiple Vitamin (MULTIVITAMIN WITH MINERALS) TABS tablet, Take 1 tablet by mouth daily., Disp: , Rfl:    Objective:     BP 118/64   Pulse 71   Temp 97.6 F (36.4 C)   Ht 4\' 11"  (1.499 m)   Wt 92 lb 9.6 oz (42 kg)   SpO2 97%   BMI 18.70 kg/m    Physical Exam Constitutional:      General: She is not in acute distress.    Appearance: Normal appearance. She is not ill-appearing, toxic-appearing or diaphoretic.  HENT:     Head: Normocephalic and atraumatic.     Right Ear: External ear normal.     Left Ear: External ear normal.  Eyes:     General: No scleral icterus.       Right eye: No discharge.        Left eye: No discharge.     Extraocular Movements: Extraocular movements intact.     Conjunctiva/sclera: Conjunctivae normal.  Cardiovascular:     Rate and Rhythm: Normal rate and regular rhythm.  Pulmonary:     Effort: Pulmonary effort is normal. No respiratory distress.     Breath sounds: Normal breath sounds. No wheezing, rhonchi or rales.  Abdominal:     General: Bowel sounds are normal.  Tenderness: There is no abdominal tenderness. There is no guarding.  Musculoskeletal:     Cervical back: No rigidity or tenderness.     Right lower leg: No edema.     Left lower leg: No edema.  Skin:    General: Skin is warm and dry.  Neurological:     Mental Status: She is alert and oriented to person, place, and time.  Psychiatric:        Mood and Affect: Mood normal.        Behavior: Behavior normal.      No results found for any visits on 07/11/23.    The ASCVD Risk score (Arnett DK, et al., 2019) failed to calculate for the following reasons:   Risk score cannot be calculated because patient has a medical history suggesting prior/existing ASCVD    Assessment & Plan:   Hospital discharge  follow-up  Acquired hypothyroidism  Essential hypertension -     Comprehensive metabolic panel    Return in about 6 months (around 01/08/2024), or if symptoms worsen or fail to improve.    [name], MD

## 2023-07-14 ENCOUNTER — Other Ambulatory Visit: Payer: Self-pay | Admitting: Family Medicine

## 2023-07-14 DIAGNOSIS — I10 Essential (primary) hypertension: Secondary | ICD-10-CM

## 2023-07-18 ENCOUNTER — Other Ambulatory Visit: Payer: Self-pay | Admitting: Family Medicine

## 2023-07-18 DIAGNOSIS — I10 Essential (primary) hypertension: Secondary | ICD-10-CM

## 2023-08-19 ENCOUNTER — Telehealth: Payer: Self-pay | Admitting: Adult Health

## 2023-08-19 MED ORDER — METOPROLOL SUCCINATE ER 25 MG PO TB24: 12.5000 mg | ORAL_TABLET | Freq: Every day | ORAL | 2 refills | Status: DC

## 2023-08-19 NOTE — Telephone Encounter (Signed)
*  STAT* If patient is at the pharmacy, call can be transferred to refill team.   1. Which medications need to be refilled? (please list name of each medication and dose if known) metoprolol succinate (TOPROL-XL) 25 MG 24 hr tablet   2. Which pharmacy/location (including street and city if local pharmacy) is medication to be sent to? [id number] - [place], [place] - [address] AT [facility] OF [address] [phone]   3. Do they need a 30 day or 90 day supply? 90

## 2023-08-26 ENCOUNTER — Telehealth: Payer: Self-pay | Admitting: Cardiology

## 2023-08-26 MED ORDER — LOSARTAN POTASSIUM 25 MG PO TABS: 12.5000 mg | ORAL_TABLET | Freq: Every day | ORAL | 2 refills | Status: DC

## 2023-08-26 NOTE — Telephone Encounter (Signed)
*  STAT* If patient is at the pharmacy, call can be transferred to refill team.   1. Which medications need to be refilled? (please list name of each medication and dose if known)  losartan (COZAAR) 25 MG tablet  2. Which pharmacy/location (including street and city if local pharmacy) is medication to be sent to? [id number] - [place], [place] - [address] AT [address] OF [address]  3. Do they need a 30 day or 90 day supply?   90 day

## 2023-08-26 NOTE — Telephone Encounter (Signed)
 Pt's medication was sent to pt's pharmacy as requested. Confirmation received.

## 2023-09-05 ENCOUNTER — Encounter: Payer: Self-pay | Admitting: Cardiology

## 2023-09-05 ENCOUNTER — Ambulatory Visit: Payer: Medicare Other | Attending: Cardiology | Admitting: Cardiology

## 2023-09-05 VITALS — BP 128/66 | HR 73 | Ht 59.0 in | Wt 93.1 lb

## 2023-09-05 DIAGNOSIS — I351 Nonrheumatic aortic (valve) insufficiency: Secondary | ICD-10-CM | POA: Insufficient documentation

## 2023-09-05 DIAGNOSIS — I9589 Other hypotension: Secondary | ICD-10-CM | POA: Insufficient documentation

## 2023-09-05 DIAGNOSIS — Z79899 Other long term (current) drug therapy: Secondary | ICD-10-CM | POA: Insufficient documentation

## 2023-09-05 DIAGNOSIS — I5181 Takotsubo syndrome: Secondary | ICD-10-CM | POA: Insufficient documentation

## 2023-09-05 MED ORDER — EMPAGLIFLOZIN 10 MG PO TABS: 10.0000 mg | ORAL_TABLET | Freq: Every day | ORAL | 11 refills | Status: AC

## 2023-09-05 MED ORDER — METOPROLOL SUCCINATE ER 25 MG PO TB24: 12.5000 mg | ORAL_TABLET | Freq: Every day | ORAL | 3 refills | Status: AC

## 2023-09-05 MED ORDER — FUROSEMIDE 20 MG PO TABS: 20.0000 mg | ORAL_TABLET | Freq: Every day | ORAL | 2 refills | Status: DC | PRN

## 2023-09-05 NOTE — Patient Instructions (Addendum)
 Medication Instructions:  Your physician recommends that you continue on your current medications as directed. Please refer to the Current Medication list given to you today.  *If you need a refill on your cardiac medications before your next appointment, please call your pharmacy*   Lab Work: CMET, Mag If you have labs (blood work) drawn today and your tests are completely normal, you will receive your results only by: MyChart Message (if you have MyChart) OR A paper copy in the mail If you have any lab test that is abnormal or we need to change your treatment, we will call you to review the results.   Testing/Procedures: Your physician has requested that you have an echocardiogram in 6 months. Echocardiography is a painless test that uses sound waves to create images of your heart. It provides your doctor with information about the size and shape of your heart and how well your heart's chambers and valves are working. This procedure takes approximately one hour. There are no restrictions for this procedure. Please do NOT wear cologne, perfume, aftershave, or lotions (deodorant is allowed). Please arrive 15 minutes prior to your appointment time.  Please note: We ask at that you not bring children with you during ultrasound (echo/ vascular) testing. Due to room size and safety concerns, children are not allowed in the ultrasound rooms during exams. Our front office staff cannot provide observation of children in our lobby area while testing is being conducted. An adult accompanying a patient to their appointment will only be allowed in the ultrasound room at the discretion of the ultrasound technician under special circumstances. We apologize for any inconvenience.    Follow-Up: At [facility], you and your health needs are our priority.  As part of our continuing mission to provide you with exceptional heart care, we have created designated Provider Care Teams.  These Care Teams  include your primary Cardiologist (physician) and Advanced Practice Providers (APPs -  Physician Assistants and Nurse Practitioners) who all work together to provide you with the care you need, when you need it.  Your next appointment:   6 month(s)  Provider:   [name], DO    Other instructions:   1st Floor: - Lobby - Registration  - Pharmacy  - Lab - Cafe  2nd Floor: - PV Lab - Diagnostic Testing (echo, CT, nuclear med)  3rd Floor: - Vacant  4th Floor: - TCTS (cardiothoracic surgery) - AFib Clinic - Structural Heart Clinic - Vascular Surgery  - Vascular Ultrasound  5th Floor: - HeartCare Cardiology (general and EP) - Clinical Pharmacy for coumadin, hypertension, lipid, weight-loss medications, and med management appointments    Valet parking services will be available as well.

## 2023-09-06 LAB — COMPREHENSIVE METABOLIC PANEL
ALT: 13 IU/L (ref 0–32)
AST: 16 IU/L (ref 0–40)
Albumin: 4.3 g/dL (ref 3.8–4.8)
Alkaline Phosphatase: 89 IU/L (ref 44–121)
BUN/Creatinine Ratio: 22 (ref 12–28)
BUN: 18 mg/dL (ref 8–27)
Bilirubin Total: 0.4 mg/dL (ref 0.0–1.2)
CO2: 23 mmol/L (ref 20–29)
Calcium: 10.4 mg/dL — ABNORMAL HIGH (ref 8.7–10.3)
Chloride: 104 mmol/L (ref 96–106)
Creatinine, Ser: 0.83 mg/dL (ref 0.57–1.00)
Globulin, Total: 2.2 g/dL (ref 1.5–4.5)
Glucose: 75 mg/dL (ref 70–99)
Potassium: 5.1 mmol/L (ref 3.5–5.2)
Sodium: 142 mmol/L (ref 134–144)
Total Protein: 6.5 g/dL (ref 6.0–8.5)
eGFR: 73 mL/min/{1.73_m2} (ref >59)

## 2023-09-06 LAB — MAGNESIUM: Magnesium: 2.3 mg/dL (ref 1.6–2.3)

## 2023-09-06 NOTE — Progress Notes (Signed)
 Cardiology Office Note:    Date:  09/06/2023   ID:  Martha Swanson, DOB 1945-07-13, MRN [id number]  PCP:  [name], MD  Cardiologist:  [name], DO  Electrophysiologist:  None   Referring MD: [name],*   " I am doing fine"   History of Present Illness:    Martha Swanson is a 78 y.o. female with a hx of stress cardiomyopathy in 2012, with admission from 05/12/2023 to 05/15/2023 for decompensated systolic CHF and hypertensive heart disease and Takotsubo CM. Marland KitchenMod to severe MR.   She was recently seen by [name], NP in 05/2023 at that time she was doing well. She was exercising and adjusting to home. No chest pain or shortness of breath.   She reports feeling better and has been active, walking at least a mile daily. She has not experienced any chest pain or shortness of breath. She has been monitoring her blood pressure at home, which ranges from 94 to 120 systolic. She also reports taking her medications as prescribed. The patient is also on a medication called Jardiance, which she believes will run out soon and would like a refill.  Past Medical History:  Diagnosis Date   Heart disease    Takotsubo cardiomyopathy     cardiac catheterization initial ejection fraction 20% no coronary artery disease ejection fraction improved after 5 days    Unspecified hypothyroidism     Past Surgical History:  Procedure Laterality Date   ABDOMINAL HYSTERECTOMY     COLONOSCOPY     COLONOSCOPY N/A 04/01/2013   Procedure: COLONOSCOPY;  Surgeon: Martha Hippo, MD;  Location: [facility];  Service: Endoscopy;  Laterality: N/A;  830   RIGHT/LEFT HEART CATH AND CORONARY ANGIOGRAPHY N/A 05/14/2023   Procedure: RIGHT/LEFT HEART CATH AND CORONARY ANGIOGRAPHY;  Surgeon: Martha Decamp, MD;  Location: [facility];  Service: Cardiovascular;  Laterality: N/A;   THYROIDECTOMY  1997   TONSILLECTOMY      Current Medications: Current Meds  Medication Sig    calcium carbonate (OS-CAL) 600 MG TABS tablet Take 1 tablet (600 mg total) by mouth 2 (two) times daily with a meal.   levothyroxine (SYNTHROID) 50 MCG tablet TAKE 1 TABLET BY MOUTH EVERY DAY BEFORE BREAKFAST   losartan (COZAAR) 25 MG tablet Take 0.5 tablets (12.5 mg total) by mouth daily.   Multiple Vitamin (MULTIVITAMIN WITH MINERALS) TABS tablet Take 1 tablet by mouth daily.   [DISCONTINUED] empagliflozin (JARDIANCE) 10 MG TABS tablet Take 1 tablet (10 mg total) by mouth daily.   [DISCONTINUED] furosemide (LASIX) 20 MG tablet Take 1 tablet (20 mg total) by mouth daily as needed for fluid or edema.   [DISCONTINUED] metoprolol succinate (TOPROL-XL) 25 MG 24 hr tablet Take 0.5 tablets (12.5 mg total) by mouth daily.     Allergies:   Penicillins   Social History   Socioeconomic History   Marital status: Married    Spouse name: Optometrist   Number of children: 2   Years of education: Not on file   Highest education level: Associate degree: academic program  Occupational History    Comment: Research scientist (physical sciences)   Occupation: Retired  Tobacco Use   Smoking status: Never   Smokeless tobacco: Never  Vaping Use   Vaping status: Never Used  Substance and Sexual Activity   Alcohol use: Yes    Comment: occasional wine   Drug use: No   Sexual activity: Not on file  Other Topics Concern  Not on file  Social History Narrative   Not on file   Social Drivers of Health   Financial Resource Strain: Patient Declined (07/07/2023)   Overall Financial Resource Strain (CARDIA)    Difficulty of Paying Living Expenses: Patient declined  Food Insecurity: Patient Declined (07/07/2023)   Hunger Vital Sign    Worried About Running Out of Food in the Last Year: Patient declined    Ran Out of Food in the Last Year: Patient declined  Transportation Needs: Unknown (07/07/2023)   PRAPARE - Transportation    Lack of Transportation (Medical): No    Lack of Transportation (Non-Medical): Patient declined  Physical  Activity: Sufficiently Active (07/07/2023)   Exercise Vital Sign    Days of Exercise per Week: 4 days    Minutes of Exercise per Session: 40 min  Stress: No Stress Concern Present (07/07/2023)   [employer] of Occupational Health - Occupational Stress Questionnaire    Feeling of Stress : Only a little  Social Connections: Unknown (07/07/2023)   Social Connection and Isolation Panel [NHANES]    Frequency of Communication with Friends and Family: Patient declined    Frequency of Social Gatherings with Friends and Family: Patient declined    Attends Religious Services: Patient declined    Database administrator or Organizations: Patient declined    Attends Engineer, structural: More than 4 times per year    Marital Status: Married     Family History: The patient's family history includes Arthritis in her brother, brother, and mother; Asthma in her brother; Breast cancer (age of onset: 33) in her maternal aunt; COPD in her mother; Cancer in her maternal grandfather; Early death in her father; Heart attack (age of onset: 77) in her father; Hypertension in her brother and mother. There is no history of Colon cancer.  ROS:   Review of Systems  Constitution: Negative for decreased appetite, fever and weight gain.  HENT: Negative for congestion, ear discharge, hoarse voice and sore throat.   Eyes: Negative for discharge, redness, vision loss in right eye and visual halos.  Cardiovascular: Negative for chest pain, dyspnea on exertion, leg swelling, orthopnea and palpitations.  Respiratory: Negative for cough, hemoptysis, shortness of breath and snoring.   Endocrine: Negative for heat intolerance and polyphagia.  Hematologic/Lymphatic: Negative for bleeding problem. Does not bruise/bleed easily.  Skin: Negative for flushing, nail changes, rash and suspicious lesions.  Musculoskeletal: Negative for arthritis, joint pain, muscle cramps, myalgias, neck pain and stiffness.   Gastrointestinal: Negative for abdominal pain, bowel incontinence, diarrhea and excessive appetite.  Genitourinary: Negative for decreased libido, genital sores and incomplete emptying.  Neurological: Negative for brief paralysis, focal weakness, headaches and loss of balance.  Psychiatric/Behavioral: Negative for altered mental status, depression and suicidal ideas.  Allergic/Immunologic: Negative for HIV exposure and persistent infections.    EKGs/Labs/Other Studies Reviewed:    The following studies were reviewed today:   EKG:  The ekg ordered today demonstrates   Recent Labs: 05/13/2023: TSH 2.423 05/15/2023: Hemoglobin 12.0; Platelets 241 05/29/2023: B Natriuretic Peptide 237.5 09/05/2023: ALT 13; BUN 18; Creatinine, Ser 0.83; Magnesium 2.3; Potassium 5.1; Sodium 142  Recent Lipid Panel    Component Value Date/Time   CHOL 188 01/10/2023 0856   TRIG 79.0 01/10/2023 0856   HDL 69.70 01/10/2023 0856   CHOLHDL 3 01/10/2023 0856   VLDL 15.8 01/10/2023 0856   LDLCALC 102 (H) 01/10/2023 0856   LDLDIRECT 95.0 12/24/2019 0944    Physical Exam:  VS:  BP 128/66 (BP Location: Right Arm, Patient Position: Sitting, Cuff Size: Normal)   Pulse 73   Ht 4\' 11"  (1.499 m)   Wt 93 lb 1.6 oz (42.2 kg)   SpO2 99%   BMI 18.80 kg/m     Wt Readings from Last 3 Encounters:  09/05/23 93 lb 1.6 oz (42.2 kg)  07/11/23 92 lb 9.6 oz (42 kg)  06/04/23 92 lb 9.6 oz (42 kg)     GEN: Well nourished, well developed in no acute distress HEENT: Normal NECK: No JVD; No carotid bruits LYMPHATICS: No lymphadenopathy CARDIAC: S1S2 noted,RRR, no murmurs, rubs, gallops RESPIRATORY:  Clear to auscultation without rales, wheezing or rhonchi  ABDOMEN: Soft, non-tender, non-distended, +bowel sounds, no guarding. EXTREMITIES: No edema, No cyanosis, no clubbing MUSCULOSKELETAL:  No deformity  SKIN: Warm and dry NEUROLOGIC:  Alert and oriented x 3, non-focal PSYCHIATRIC:  Normal affect, good  insight  ASSESSMENT:    1. Stress-induced cardiomyopathy   2. Medication management   3. Nonrheumatic aortic valve insufficiency   4. Other specified hypotension    PLAN:    Stress cardiomyopathy with prior myocardial infarction and coronary artery blockages. Ejection fraction 20-45% with incomplete recovery. Medication optimization limited by hypotension. Active lifestyle without angina or dyspnea. Positive recovery indicators. - Continue current medications without dose increase due to hypotension. - Schedule repeat echocardiogram prior to next visit. - Regular blood pressure monitoring. - Encourage daily physical activity.  Moderate to severe mitral regurgitation - clinically euvolemic.   Medication Management Requires medication refill with cost considerations. - Refill medication with a 30-day supply for specified medication and 90-day supplies for others. - Send prescriptions to [facility] in [place].  The patient is in agreement with the above plan. The patient left the office in stable condition.  The patient will follow up in   Medication Adjustments/Labs and Tests Ordered: Current medicines are reviewed at length with the patient today.  Concerns regarding medicines are outlined above.  Orders Placed This Encounter  Procedures   Comprehensive Metabolic Panel (CMET)   Magnesium   ECHOCARDIOGRAM COMPLETE   Meds ordered this encounter  Medications   empagliflozin (JARDIANCE) 10 MG TABS tablet    Sig: Take 1 tablet (10 mg total) by mouth daily.    Dispense:  30 tablet    Refill:  11   furosemide (LASIX) 20 MG tablet    Sig: Take 1 tablet (20 mg total) by mouth daily as needed for fluid or edema.    Dispense:  30 tablet    Refill:  2   metoprolol succinate (TOPROL-XL) 25 MG 24 hr tablet    Sig: Take 0.5 tablets (12.5 mg total) by mouth daily.    Dispense:  45 tablet    Refill:  3    Patient Instructions  Medication Instructions:  Your physician recommends  that you continue on your current medications as directed. Please refer to the Current Medication list given to you today.  *If you need a refill on your cardiac medications before your next appointment, please call your pharmacy*   Lab Work: CMET, Mag If you have labs (blood work) drawn today and your tests are completely normal, you will receive your results only by: MyChart Message (if you have MyChart) OR A paper copy in the mail If you have any lab test that is abnormal or we need to change your treatment, we will call you to review the results.   Testing/Procedures: Your physician has requested that you  have an echocardiogram in 6 months. Echocardiography is a painless test that uses sound waves to create images of your heart. It provides your doctor with information about the size and shape of your heart and how well your heart's chambers and valves are working. This procedure takes approximately one hour. There are no restrictions for this procedure. Please do NOT wear cologne, perfume, aftershave, or lotions (deodorant is allowed). Please arrive 15 minutes prior to your appointment time.  Please note: We ask at that you not bring children with you during ultrasound (echo/ vascular) testing. Due to room size and safety concerns, children are not allowed in the ultrasound rooms during exams. Our front office staff cannot provide observation of children in our lobby area while testing is being conducted. An adult accompanying a patient to their appointment will only be allowed in the ultrasound room at the discretion of the ultrasound technician under special circumstances. We apologize for any inconvenience.    Follow-Up: At [facility], you and your health needs are our priority.  As part of our continuing mission to provide you with exceptional heart care, we have created designated Provider Care Teams.  These Care Teams include your primary Cardiologist (physician) and  Advanced Practice Providers (APPs -  Physician Assistants and Nurse Practitioners) who all work together to provide you with the care you need, when you need it.  Your next appointment:   6 month(s)  Provider:   [name], DO    Other instructions:   1st Floor: - Lobby - Registration  - Pharmacy  - Lab - Cafe  2nd Floor: - PV Lab - Diagnostic Testing (echo, CT, nuclear med)  3rd Floor: - Vacant  4th Floor: - TCTS (cardiothoracic surgery) - AFib Clinic - Structural Heart Clinic - Vascular Surgery  - Vascular Ultrasound  5th Floor: - HeartCare Cardiology (general and EP) - Clinical Pharmacy for coumadin, hypertension, lipid, weight-loss medications, and med management appointments    Valet parking services will be available as well.     Adopting a Healthy Lifestyle.  Know what a healthy weight is for you (roughly BMI <25) and aim to maintain this   Aim for 7+ servings of fruits and vegetables daily   65-80+ fluid ounces of water or unsweet tea for healthy kidneys   Limit to max 1 drink of alcohol per day; avoid smoking/tobacco   Limit animal fats in diet for cholesterol and heart health - choose grass fed whenever available   Avoid highly processed foods, and foods high in saturated/trans fats   Aim for low stress - take time to unwind and care for your mental health   Aim for 150 min of moderate intensity exercise weekly for heart health, and weights twice weekly for bone health   Aim for 7-9 hours of sleep daily   When it comes to diets, agreement about the perfect plan isnt easy to find, even among the experts. Experts at the [facility] [employer] developed an idea known as the Healthy Eating Plate. Just imagine a plate divided into logical, healthy portions.   The emphasis is on diet quality:   Load up on vegetables and fruits - one-half of your plate: Aim for color and variety, and remember that potatoes dont count.   Go for whole  grains - one-quarter of your plate: Whole wheat, barley, wheat berries, quinoa, oats, brown rice, and foods made with them. If you want pasta, go with whole wheat pasta.  Protein power - one-quarter of your plate: Fish, chicken, beans, and nuts are all healthy, versatile protein sources. Limit red meat.   The diet, however, does go beyond the plate, offering a few other suggestions.   Use healthy plant oils, such as olive, canola, soy, corn, sunflower and peanut. Check the labels, and avoid partially hydrogenated oil, which have unhealthy trans fats.   If youre thirsty, drink water. Coffee and tea are good in moderation, but skip sugary drinks and limit milk and dairy products to one or two daily servings.   The type of carbohydrate in the diet is more important than the amount. Some sources of carbohydrates, such as vegetables, fruits, whole grains, and beans-are healthier than others.   Finally, stay active  Signed, [name], DO  09/06/2023 8:26 PM    [facility]

## 2023-09-09 ENCOUNTER — Encounter: Payer: Self-pay | Admitting: Cardiology

## 2023-09-30 ENCOUNTER — Other Ambulatory Visit (HOSPITAL_COMMUNITY)

## 2023-10-07 ENCOUNTER — Other Ambulatory Visit: Payer: Self-pay | Admitting: Family Medicine

## 2023-10-07 DIAGNOSIS — Z0000 Encounter for general adult medical examination without abnormal findings: Secondary | ICD-10-CM

## 2023-10-28 ENCOUNTER — Encounter (HOSPITAL_COMMUNITY): Payer: Self-pay

## 2023-11-04 ENCOUNTER — Ambulatory Visit
Admission: RE | Admit: 2023-11-04 | Discharge: 2023-11-04 | Disposition: A | Discharge status: Home / Self Care | Source: Ambulatory Visit | Attending: Family Medicine | Admitting: Family Medicine

## 2023-11-04 DIAGNOSIS — Z0000 Encounter for general adult medical examination without abnormal findings: Secondary | ICD-10-CM

## 2023-11-04 DIAGNOSIS — Z1231 Encounter for screening mammogram for malignant neoplasm of breast: Secondary | ICD-10-CM | POA: Diagnosis not present

## 2023-12-19 ENCOUNTER — Other Ambulatory Visit: Payer: Self-pay | Admitting: Family Medicine

## 2023-12-19 DIAGNOSIS — E039 Hypothyroidism, unspecified: Secondary | ICD-10-CM

## 2024-01-09 ENCOUNTER — Encounter: Payer: Self-pay | Admitting: Family Medicine

## 2024-01-09 ENCOUNTER — Ambulatory Visit (INDEPENDENT_AMBULATORY_CARE_PROVIDER_SITE_OTHER): Payer: Medicare Other | Admitting: Family Medicine

## 2024-01-09 ENCOUNTER — Ambulatory Visit: Payer: Self-pay | Admitting: Family Medicine

## 2024-01-09 VITALS — BP 114/64 | HR 62 | Temp 98.5°F | Ht 59.0 in | Wt 95.2 lb

## 2024-01-09 DIAGNOSIS — I10 Essential (primary) hypertension: Secondary | ICD-10-CM

## 2024-01-09 DIAGNOSIS — E039 Hypothyroidism, unspecified: Secondary | ICD-10-CM | POA: Diagnosis not present

## 2024-01-09 DIAGNOSIS — E559 Vitamin D deficiency, unspecified: Secondary | ICD-10-CM

## 2024-01-09 LAB — URINALYSIS, ROUTINE W REFLEX MICROSCOPIC
Bilirubin Urine: NEGATIVE
Hgb urine dipstick: NEGATIVE
Ketones, ur: NEGATIVE
Leukocytes,Ua: NEGATIVE
Nitrite: NEGATIVE
RBC / HPF: NONE SEEN (ref 0–?)
Specific Gravity, Urine: 1.01 (ref 1.000–1.030)
Total Protein, Urine: NEGATIVE
Urine Glucose: >=1000 — AB
Urobilinogen, UA: 0.2 (ref 0.0–1.0)
pH: 6 (ref 5.0–8.0)

## 2024-01-09 LAB — BASIC METABOLIC PANEL WITH GFR
BUN: 14 mg/dL (ref 6–23)
CO2: 29 meq/L (ref 19–32)
Calcium: 9.4 mg/dL (ref 8.4–10.5)
Chloride: 103 meq/L (ref 96–112)
Creatinine, Ser: 0.84 mg/dL (ref 0.40–1.20)
GFR: 66.64 mL/min (ref >60.00)
Glucose, Bld: 83 mg/dL (ref 70–99)
Potassium: 4 meq/L (ref 3.5–5.1)
Sodium: 137 meq/L (ref 135–145)

## 2024-01-09 LAB — VITAMIN D 25 HYDROXY (VIT D DEFICIENCY, FRACTURES): VITD: 37.68 ng/mL (ref 30.00–100.00)

## 2024-01-09 LAB — CBC
HCT: 43.2 % (ref 36.0–46.0)
Hemoglobin: 14.5 g/dL (ref 12.0–15.0)
MCHC: 33.5 g/dL (ref 30.0–36.0)
MCV: 89.7 fl (ref 78.0–100.0)
Platelets: 270 K/uL (ref 150.0–400.0)
RBC: 4.82 Mil/uL (ref 3.87–5.11)
RDW: 13.8 % (ref 11.5–15.5)
WBC: 5.9 K/uL (ref 4.0–10.5)

## 2024-01-09 LAB — TSH: TSH: 0.77 u[IU]/mL (ref 0.35–5.50)

## 2024-01-09 NOTE — Progress Notes (Signed)
 Established Patient Office Visit   Subjective:  Patient ID: Martha Swanson, female    DOB: 27-Dec-1945  Age: 78 y.o. MRN: [id number]  Chief Complaint  Patient presents with   Medical Management of Chronic Issues    6 month follow up. Pt is fasting. No concerns.     HPI Encounter Diagnoses  Name Primary?   Acquired hypothyroidism Yes   Vitamin D  deficiency    Essential hypertension   For follow-up of above.  Hypothyroidism well-controlled with low-dose levothyroxine  at 50 mcg.  Continues on multivitamin with vitamin D .  Hypertension with history of compensated Takotsubo cardiomyopathy.  Exercising regularly.  No problems with cold sensitivity hair loss to patient.  Difficulty with urination.    Review of Systems  Constitutional: Negative.   HENT: Negative.    Eyes:  Negative for blurred vision, discharge and redness.  Respiratory: Negative.    Cardiovascular: Negative.   Gastrointestinal:  Negative for abdominal pain.  Genitourinary: Negative.  Negative for dysuria, frequency and urgency.  Musculoskeletal: Negative.  Negative for myalgias.  Skin:  Negative for rash.  Neurological:  Negative for tingling, loss of consciousness and weakness.  Endo/Heme/Allergies:  Negative for polydipsia.     Current Outpatient Medications:    calcium  carbonate (OS-CAL) 600 MG TABS tablet, Take 1 tablet (600 mg total) by mouth 2 (two) times daily with a meal., Disp: 60 tablet, Rfl: 12   empagliflozin  (JARDIANCE ) 10 MG TABS tablet, Take 1 tablet (10 mg total) by mouth daily., Disp: 30 tablet, Rfl: 11   furosemide  (LASIX ) 20 MG tablet, Take 1 tablet (20 mg total) by mouth daily as needed for fluid or edema., Disp: 30 tablet, Rfl: 2   levothyroxine  (SYNTHROID ) 50 MCG tablet, TAKE 1 TABLET BY MOUTH EVERY DAY BEFORE BREAKFAST, Disp: 90 tablet, Rfl: 2   losartan  (COZAAR ) 25 MG tablet, Take 0.5 tablets (12.5 mg total) by mouth daily., Disp: 45 tablet, Rfl: 2   metoprolol  succinate (TOPROL -XL) 25 MG  24 hr tablet, Take 0.5 tablets (12.5 mg total) by mouth daily., Disp: 45 tablet, Rfl: 3   Multiple Vitamin (MULTIVITAMIN WITH MINERALS) TABS tablet, Take 1 tablet by mouth daily., Disp: , Rfl:    Objective:     BP 114/64 (BP Location: Right Arm, Patient Position: Sitting, Cuff Size: Small)   Pulse 62   Temp 98.5 F (36.9 C) (Temporal)   Ht 4' 11 (1.499 m)   Wt 95 lb 3.2 oz (43.2 kg)   SpO2 96%   BMI 19.23 kg/m    Physical Exam Constitutional:      General: She is not in acute distress.    Appearance: Normal appearance. She is not ill-appearing, toxic-appearing or diaphoretic.  HENT:     Head: Normocephalic and atraumatic.     Right Ear: External ear normal.     Left Ear: External ear normal.     Mouth/Throat:     Mouth: Mucous membranes are moist.     Pharynx: Oropharynx is clear. No oropharyngeal exudate or posterior oropharyngeal erythema.  Eyes:     General: No scleral icterus.       Right eye: No discharge.        Left eye: No discharge.     Extraocular Movements: Extraocular movements intact.     Conjunctiva/sclera: Conjunctivae normal.     Pupils: Pupils are equal, round, and reactive to light.  Cardiovascular:     Rate and Rhythm: Normal rate and regular rhythm.  Pulmonary:  Effort: Pulmonary effort is normal. No respiratory distress.     Breath sounds: Normal breath sounds.  Abdominal:     General: Bowel sounds are normal.     Tenderness: There is no right CVA tenderness or left CVA tenderness.  Musculoskeletal:     Cervical back: No rigidity or tenderness.  Lymphadenopathy:     Cervical: No cervical adenopathy.  Skin:    General: Skin is warm and dry.  Neurological:     Mental Status: She is alert and oriented to person, place, and time.  Psychiatric:        Mood and Affect: Mood normal.        Behavior: Behavior normal.      No results found for any visits on 01/09/24.    The ASCVD Risk score (Arnett DK, et al., 2019) failed to calculate for  the following reasons:   Risk score cannot be calculated because patient has a medical history suggesting prior/existing ASCVD    Assessment & Plan:   Acquired hypothyroidism -     TSH  Vitamin D  deficiency -     VITAMIN D  25 Hydroxy (Vit-D Deficiency, Fractures)  Essential hypertension -     Basic metabolic panel with GFR -     CBC -     Urinalysis, Routine w reflex microscopic    Return in about 6 months (around 07/11/2024).  Continue current medications.  Adjustments made pending results labs.  Martha Sim Lent, MD

## 2024-02-13 DIAGNOSIS — Z23 Encounter for immunization: Secondary | ICD-10-CM | POA: Diagnosis not present

## 2024-02-28 ENCOUNTER — Encounter: Payer: Self-pay | Admitting: Cardiology

## 2024-03-06 ENCOUNTER — Ambulatory Visit (HOSPITAL_COMMUNITY)
Admission: RE | Admit: 2024-03-06 | Discharge: 2024-03-06 | Disposition: A | Discharge status: Home / Self Care | Source: Ambulatory Visit | Attending: Cardiology | Admitting: Cardiology

## 2024-03-06 DIAGNOSIS — I5181 Takotsubo syndrome: Secondary | ICD-10-CM | POA: Insufficient documentation

## 2024-03-06 LAB — ECHOCARDIOGRAM COMPLETE
Area-P 1/2: 3.3 cm2
S' Lateral: 2.7 cm

## 2024-04-10 ENCOUNTER — Ambulatory Visit: Attending: Cardiology | Admitting: Cardiology

## 2024-04-10 VITALS — BP 120/72 | HR 56 | Ht 59.0 in | Wt 96.0 lb

## 2024-04-10 DIAGNOSIS — I5181 Takotsubo syndrome: Secondary | ICD-10-CM | POA: Insufficient documentation

## 2024-04-10 DIAGNOSIS — I340 Nonrheumatic mitral (valve) insufficiency: Secondary | ICD-10-CM | POA: Insufficient documentation

## 2024-04-10 DIAGNOSIS — I10 Essential (primary) hypertension: Secondary | ICD-10-CM | POA: Diagnosis present

## 2024-04-10 DIAGNOSIS — I351 Nonrheumatic aortic (valve) insufficiency: Secondary | ICD-10-CM | POA: Insufficient documentation

## 2024-04-10 NOTE — Patient Instructions (Signed)
   Testing/Procedures:  Your physician has requested that you have a TEE. During a TEE, sound waves are used to create images of your heart. It provides your doctor with information about the size and shape of your heart and how well your heart's chambers and valves are working. In this test, a transducer is attached to the end of a flexible tube that's guided down your throat and into your esophagus (the tube leading from you mouth to your stomach) to get a more detailed image of your heart. You are not awake for the procedure. Please see the instruction sheet given to you today. For further information please visit [email or web address]    Follow-Up: At [facility], you and your health needs are our priority.  As part of our continuing mission to provide you with exceptional heart care, our providers are all part of one team.  This team includes your primary Cardiologist (physician) and Advanced Practice Providers or APPs (Physician Assistants and Nurse Practitioners) who all work together to provide you with the care you need, when you need it.  Your next appointment:   12 month(s)  Provider:   Kardie Tobb, DO    Other Instructions  You are scheduled for a TEE (Transesophageal Echocardiogram) on Tuesday, October 28 with Dr. LONNI.  Please arrive at the [facility] (Main Entrance A) at [facility]: [address] [place], [place] [address] at 11:30 AM (This time is 1 hour(s) before your procedure to ensure your preparation).   Free valet parking service is available. You will check in at ADMITTING.   *Please Note: You will receive a call the day before your procedure to confirm the appointment time. That time may have changed from the original time based on the schedule for that day.*    DIET:  Nothing to eat or drink after midnight except a sip of water  with medications (see medication instructions below)   HOLD: Empagliflozin  (Jardiance ) for 3 days prior to the  procedure. Last dose on Tuesday, October 28.    LABS: TODAY  FYI:  For your safety, and to allow us  to monitor your vital signs accurately during the surgery/procedure we request: If you have artificial nails, gel coating, SNS etc, please have those removed prior to your surgery/procedure. Not having the nail coverings /polish removed may result in cancellation or delay of your surgery/procedure.  Your support person will be asked to wait in the waiting room during your procedure.  It is OK to have someone drop you off and come back when you are ready to be discharged.  You cannot drive after the procedure and will need someone to drive you home.  Bring your insurance cards.  *Special Note: Every effort is made to have your procedure done on time. Occasionally there are emergencies that occur at the hospital that may cause delays. Please be patient if a delay does occur.

## 2024-04-10 NOTE — Progress Notes (Signed)
 Cardiology Office Note:    Date:  04/11/2024   ID:  Martha Swanson, DOB 1946/01/16, MRN [id number]  PCP:  Berneta Elsie Sayre, MD  Cardiologist:  Dub Huntsman, DO  Electrophysiologist:  None   Referring MD: Berneta Elsie Sayre,*    I am doing fine   History of Present Illness:    Martha Swanson is a 78 y.o. female with a hx of stress cardiomyopathy in 2012, with admission from 05/12/2023 to 05/15/2023 for decompensated systolic CHF and hypertensive heart disease and Takotsubo CM. SABRAMod to severe MR.   She was recently seen by Lamarr Satterfield, NP in 05/2023 at that time she was doing well. She was exercising and adjusting to home. No chest pain or shortness of breath.   She reports feeling better and has been active, walking at least a mile daily. She has not experienced any chest pain or shortness of breath. She has been monitoring her blood pressure at home, which ranges from 94 to 120 systolic. She also reports taking her medications as prescribed. The patient is also on a medication called Jardiance , which she believes will run out soon and would like a refill.  Past Medical History:  Diagnosis Date   Heart disease    Takotsubo cardiomyopathy     cardiac catheterization initial ejection fraction 20% no coronary artery disease ejection fraction improved after 5 days    Unspecified hypothyroidism     Past Surgical History:  Procedure Laterality Date   ABDOMINAL HYSTERECTOMY     COLONOSCOPY     COLONOSCOPY N/A 04/01/2013   Procedure: COLONOSCOPY;  Surgeon: Claudis RAYMOND Rivet, MD;  Location: [facility];  Service: Endoscopy;  Laterality: N/A;  830   RIGHT/LEFT HEART CATH AND CORONARY ANGIOGRAPHY N/A 05/14/2023   Procedure: RIGHT/LEFT HEART CATH AND CORONARY ANGIOGRAPHY;  Surgeon: Ladona Heinz, MD;  Location: [facility];  Service: Cardiovascular;  Laterality: N/A;   THYROIDECTOMY  1997   TONSILLECTOMY      Current Medications: Current Meds  Medication Sig    calcium  carbonate (OS-CAL) 600 MG TABS tablet Take 1 tablet (600 mg total) by mouth 2 (two) times daily with a meal.   empagliflozin  (JARDIANCE ) 10 MG TABS tablet Take 1 tablet (10 mg total) by mouth daily.   furosemide  (LASIX ) 20 MG tablet Take 1 tablet (20 mg total) by mouth daily as needed for fluid or edema.   levothyroxine  (SYNTHROID ) 50 MCG tablet TAKE 1 TABLET BY MOUTH EVERY DAY BEFORE BREAKFAST   losartan  (COZAAR ) 25 MG tablet Take 0.5 tablets (12.5 mg total) by mouth daily.   metoprolol  succinate (TOPROL -XL) 25 MG 24 hr tablet Take 0.5 tablets (12.5 mg total) by mouth daily.   Multiple Vitamin (MULTIVITAMIN WITH MINERALS) TABS tablet Take 1 tablet by mouth daily.     Allergies:   Penicillins   Social History   Socioeconomic History   Marital status: Married    Spouse name: Optometrist   Number of children: 2   Years of education: Not on file   Highest education level: Associate degree: occupational, scientist, product/process development, or vocational program  Occupational History    Comment: Research scientist (physical sciences)   Occupation: Retired  Tobacco Use   Smoking status: Never   Smokeless tobacco: Never  Vaping Use   Vaping status: Never Used  Substance and Sexual Activity   Alcohol  use: Yes    Comment: occasional wine   Drug use: No   Sexual activity: Not on file  Other Topics Concern  Not on file  Social History Narrative   Not on file   Social Drivers of Health   Financial Resource Strain: Low Risk  (01/06/2024)   Overall Financial Resource Strain (CARDIA)    Difficulty of Paying Living Expenses: Not very hard  Food Insecurity: No Food Insecurity (01/06/2024)   Hunger Vital Sign    Worried About Running Out of Food in the Last Year: Never true    Ran Out of Food in the Last Year: Never true  Transportation Needs: No Transportation Needs (01/06/2024)   PRAPARE - Administrator, Civil Service (Medical): No    Lack of Transportation (Non-Medical): No  Physical Activity: Insufficiently Active  (01/06/2024)   Exercise Vital Sign    Days of Exercise per Week: 4 days    Minutes of Exercise per Session: 30 min  Stress: No Stress Concern Present (01/06/2024)   [employer] of Occupational Health - Occupational Stress Questionnaire    Feeling of Stress: Only a little  Social Connections: Socially Integrated (01/06/2024)   Social Connection and Isolation Panel    Frequency of Communication with Friends and Family: More than three times a week    Frequency of Social Gatherings with Friends and Family: Twice a week    Attends Religious Services: More than 4 times per year    Active Member of [employer] or Organizations: Yes    Attends Engineer, Structural: More than 4 times per year    Marital Status: Married     Family History: The patient's family history includes Arthritis in her brother, brother, and mother; Asthma in her brother; Breast cancer (age of onset: 84) in her maternal aunt; COPD in her mother; Cancer in her maternal grandfather; Early death in her father; Heart attack (age of onset: 70) in her father; Hypertension in her brother and mother. There is no history of Colon cancer.  ROS:   Review of Systems  Constitution: Negative for decreased appetite, fever and weight gain.  HENT: Negative for congestion, ear discharge, hoarse voice and sore throat.   Eyes: Negative for discharge, redness, vision loss in right eye and visual halos.  Cardiovascular: Negative for chest pain, dyspnea on exertion, leg swelling, orthopnea and palpitations.  Respiratory: Negative for cough, hemoptysis, shortness of breath and snoring.   Endocrine: Negative for heat intolerance and polyphagia.  Hematologic/Lymphatic: Negative for bleeding problem. Does not bruise/bleed easily.  Skin: Negative for flushing, nail changes, rash and suspicious lesions.  Musculoskeletal: Negative for arthritis, joint pain, muscle cramps, myalgias, neck pain and stiffness.  Gastrointestinal: Negative for  abdominal pain, bowel incontinence, diarrhea and excessive appetite.  Genitourinary: Negative for decreased libido, genital sores and incomplete emptying.  Neurological: Negative for brief paralysis, focal weakness, headaches and loss of balance.  Psychiatric/Behavioral: Negative for altered mental status, depression and suicidal ideas.  Allergic/Immunologic: Negative for HIV exposure and persistent infections.    EKGs/Labs/Other Studies Reviewed:    The following studies were reviewed today:   EKG:  The ekg ordered today demonstrates   Recent Labs: 05/29/2023: B Natriuretic Peptide 237.5 09/05/2023: ALT 13; Magnesium 2.3 01/09/2024: TSH 0.77 04/10/2024: BUN 13; Creatinine, Ser 0.80; Hemoglobin 14.1; Platelets 290; Potassium 4.6; Sodium 140  Recent Lipid Panel    Component Value Date/Time   CHOL 188 01/10/2023 0856   TRIG 79.0 01/10/2023 0856   HDL 69.70 01/10/2023 0856   CHOLHDL 3 01/10/2023 0856   VLDL 15.8 01/10/2023 0856   LDLCALC 102 (H) 01/10/2023 0856   LDLDIRECT  95.0 12/24/2019 0944    Physical Exam:    VS:  BP 120/72 (BP Location: Left Arm, Patient Position: Sitting, Cuff Size: Normal)   Pulse (!) 56   Ht 4' 11 (1.499 m)   Wt 96 lb (43.5 kg)   SpO2 96%   BMI 19.39 kg/m     Wt Readings from Last 3 Encounters:  04/10/24 96 lb (43.5 kg)  01/09/24 95 lb 3.2 oz (43.2 kg)  09/05/23 93 lb 1.6 oz (42.2 kg)     GEN: Well nourished, well developed in no acute distress HEENT: Normal NECK: No JVD; No carotid bruits LYMPHATICS: No lymphadenopathy CARDIAC: S1S2 noted,RRR, no murmurs, rubs, gallops RESPIRATORY:  Clear to auscultation without rales, wheezing or rhonchi  ABDOMEN: Soft, non-tender, non-distended, +bowel sounds, no guarding. EXTREMITIES: No edema, No cyanosis, no clubbing MUSCULOSKELETAL:  No deformity  SKIN: Warm and dry NEUROLOGIC:  Alert and oriented x 3, non-focal PSYCHIATRIC:  Normal affect, good insight  ASSESSMENT:    1. Primary hypertension    2. Nonrheumatic aortic valve insufficiency   3. Stress-induced cardiomyopathy   4. Nonrheumatic mitral valve regurgitation    PLAN:    Stress cardiomyopathy-this has resolved EF on March 06, 2024 is normal.  I am happy for the patient.  Continue current medication regimen losing losartan  12.5 mg daily, Toprol -XL 12.5 mg daily, Jardiance  10 mg daily.  Moderate to severe mitral regurgitation - clinically euvolemic.  It would be beneficial to get a transesophageal echocardiogram to assess the true nature of her mitral regurgitation and also understand the valve anatomy.  Informed Consent   Shared Decision Making/Informed Consent   The risks [esophageal damage, perforation (1:10,000 risk), bleeding, pharyngeal hematoma as well as other potential complications associated with conscious sedation including aspiration, arrhythmia, respiratory failure and death], benefits (treatment guidance and diagnostic support) and alternatives of a transesophageal echocardiogram were discussed in detail with Ms. Yaeger and she is willing to proceed.       Sinus bradycardia - asymptomatic. Will continue to monitor  The patient is in agreement with the above plan. The patient left the office in stable condition.  The patient will follow up in   Medication Adjustments/Labs and Tests Ordered: Current medicines are reviewed at length with the patient today.  Concerns regarding medicines are outlined above.  Orders Placed This Encounter  Procedures   Basic Metabolic Panel (BMET)   CBC   EKG 12-Lead   No orders of the defined types were placed in this encounter.   Patient Instructions    Testing/Procedures:  Your physician has requested that you have a TEE. During a TEE, sound waves are used to create images of your heart. It provides your doctor with information about the size and shape of your heart and how well your heart's chambers and valves are working. In this test, a transducer is attached to  the end of a flexible tube that's guided down your throat and into your esophagus (the tube leading from you mouth to your stomach) to get a more detailed image of your heart. You are not awake for the procedure. Please see the instruction sheet given to you today. For further information please visit [email or web address]    Follow-Up: At [facility], you and your health needs are our priority.  As part of our continuing mission to provide you with exceptional heart care, our providers are all part of one team.  This team includes your primary Cardiologist (physician) and Advanced Practice Providers or APPs (  Physician Assistants and Nurse Practitioners) who all work together to provide you with the care you need, when you need it.  Your next appointment:   12 month(s)  Provider:   Oliviagrace Crisanti, DO    Other Instructions  You are scheduled for a TEE (Transesophageal Echocardiogram) on Tuesday, October 28 with Dr. LONNI.  Please arrive at the [facility] (Main Entrance A) at [facility]: [address] [place], [place] [address] at 11:30 AM (This time is 1 hour(s) before your procedure to ensure your preparation).   Free valet parking service is available. You will check in at ADMITTING.   *Please Note: You will receive a call the day before your procedure to confirm the appointment time. That time may have changed from the original time based on the schedule for that day.*    DIET:  Nothing to eat or drink after midnight except a sip of water  with medications (see medication instructions below)   HOLD: Empagliflozin  (Jardiance ) for 3 days prior to the procedure. Last dose on Tuesday, October 28.    LABS: TODAY  FYI:  For your safety, and to allow us  to monitor your vital signs accurately during the surgery/procedure we request: If you have artificial nails, gel coating, SNS etc, please have those removed prior to your surgery/procedure. Not having the nail  coverings /polish removed may result in cancellation or delay of your surgery/procedure.  Your support person will be asked to wait in the waiting room during your procedure.  It is OK to have someone drop you off and come back when you are ready to be discharged.  You cannot drive after the procedure and will need someone to drive you home.  Bring your insurance cards.  *Special Note: Every effort is made to have your procedure done on time. Occasionally there are emergencies that occur at the hospital that may cause delays. Please be patient if a delay does occur.               Adopting a Healthy Lifestyle.  Know what a healthy weight is for you (roughly BMI <25) and aim to maintain this   Aim for 7+ servings of fruits and vegetables daily   65-80+ fluid ounces of water  or unsweet tea for healthy kidneys   Limit to max 1 drink of alcohol  per day; avoid smoking/tobacco   Limit animal fats in diet for cholesterol and heart health - choose grass fed whenever available   Avoid highly processed foods, and foods high in saturated/trans fats   Aim for low stress - take time to unwind and care for your mental health   Aim for 150 min of moderate intensity exercise weekly for heart health, and weights twice weekly for bone health   Aim for 7-9 hours of sleep daily   When it comes to diets, agreement about the perfect plan isnt easy to find, even among the experts. Experts at the [facility] [employer] developed an idea known as the Healthy Eating Plate. Just imagine a plate divided into logical, healthy portions.   The emphasis is on diet quality:   Load up on vegetables and fruits - one-half of your plate: Aim for color and variety, and remember that potatoes dont count.   Go for whole grains - one-quarter of your plate: Whole wheat, barley, wheat berries, quinoa, oats, brown rice, and foods made with them. If you want pasta, go with whole wheat pasta.   Protein power  -  one-quarter of your plate: Fish, chicken, beans, and nuts are all healthy, versatile protein sources. Limit red meat.   The diet, however, does go beyond the plate, offering a few other suggestions.   Use healthy plant oils, such as olive, canola, soy, corn, sunflower and peanut. Check the labels, and avoid partially hydrogenated oil, which have unhealthy trans fats.   If youre thirsty, drink water . Coffee and tea are good in moderation, but skip sugary drinks and limit milk and dairy products to one or two daily servings.   The type of carbohydrate in the diet is more important than the amount. Some sources of carbohydrates, such as vegetables, fruits, whole grains, and beans-are healthier than others.   Finally, stay active  Signed, Dub Huntsman, DO  04/11/2024 2:09 PM    [facility]

## 2024-04-10 NOTE — H&P (View-Only) (Signed)
 Cardiology Office Note:    Date:  04/11/2024   ID:  Martha Swanson, DOB 1946/01/16, MRN [id number]  PCP:  Berneta Elsie Sayre, MD  Cardiologist:  Dub Huntsman, DO  Electrophysiologist:  None   Referring MD: Berneta Elsie Sayre,*    I am doing fine   History of Present Illness:    Martha Swanson is a 78 y.o. female with a hx of stress cardiomyopathy in 2012, with admission from 05/12/2023 to 05/15/2023 for decompensated systolic CHF and hypertensive heart disease and Takotsubo CM. SABRAMod to severe MR.   She was recently seen by Martha Satterfield, NP in 05/2023 at that time she was doing well. She was exercising and adjusting to home. No chest pain or shortness of breath.   She reports feeling better and has been active, walking at least a mile daily. She has not experienced any chest pain or shortness of breath. She has been monitoring her blood pressure at home, which ranges from 94 to 120 systolic. She also reports taking her medications as prescribed. The patient is also on a medication called Jardiance , which she believes will run out soon and would like a refill.  Past Medical History:  Diagnosis Date   Heart disease    Takotsubo cardiomyopathy     cardiac catheterization initial ejection fraction 20% no coronary artery disease ejection fraction improved after 5 days    Unspecified hypothyroidism     Past Surgical History:  Procedure Laterality Date   ABDOMINAL HYSTERECTOMY     COLONOSCOPY     COLONOSCOPY N/A 04/01/2013   Procedure: COLONOSCOPY;  Surgeon: Claudis RAYMOND Rivet, MD;  Location: [facility];  Service: Endoscopy;  Laterality: N/A;  830   RIGHT/LEFT HEART CATH AND CORONARY ANGIOGRAPHY N/A 05/14/2023   Procedure: RIGHT/LEFT HEART CATH AND CORONARY ANGIOGRAPHY;  Surgeon: Ladona Heinz, MD;  Location: [facility];  Service: Cardiovascular;  Laterality: N/A;   THYROIDECTOMY  1997   TONSILLECTOMY      Current Medications: Current Meds  Medication Sig    calcium  carbonate (OS-CAL) 600 MG TABS tablet Take 1 tablet (600 mg total) by mouth 2 (two) times daily with a meal.   empagliflozin  (JARDIANCE ) 10 MG TABS tablet Take 1 tablet (10 mg total) by mouth daily.   furosemide  (LASIX ) 20 MG tablet Take 1 tablet (20 mg total) by mouth daily as needed for fluid or edema.   levothyroxine  (SYNTHROID ) 50 MCG tablet TAKE 1 TABLET BY MOUTH EVERY DAY BEFORE BREAKFAST   losartan  (COZAAR ) 25 MG tablet Take 0.5 tablets (12.5 mg total) by mouth daily.   metoprolol  succinate (TOPROL -XL) 25 MG 24 hr tablet Take 0.5 tablets (12.5 mg total) by mouth daily.   Multiple Vitamin (MULTIVITAMIN WITH MINERALS) TABS tablet Take 1 tablet by mouth daily.     Allergies:   Penicillins   Social History   Socioeconomic History   Marital status: Married    Spouse name: Optometrist   Number of children: 2   Years of education: Not on file   Highest education level: Associate degree: occupational, scientist, product/process development, or vocational program  Occupational History    Comment: Research scientist (physical sciences)   Occupation: Retired  Tobacco Use   Smoking status: Never   Smokeless tobacco: Never  Vaping Use   Vaping status: Never Used  Substance and Sexual Activity   Alcohol  use: Yes    Comment: occasional wine   Drug use: No   Sexual activity: Not on file  Other Topics Concern  Not on file  Social History Narrative   Not on file   Social Drivers of Health   Financial Resource Strain: Low Risk  (01/06/2024)   Overall Financial Resource Strain (CARDIA)    Difficulty of Paying Living Expenses: Not very hard  Food Insecurity: No Food Insecurity (01/06/2024)   Hunger Vital Sign    Worried About Running Out of Food in the Last Year: Never true    Ran Out of Food in the Last Year: Never true  Transportation Needs: No Transportation Needs (01/06/2024)   PRAPARE - Administrator, Civil Service (Medical): No    Lack of Transportation (Non-Medical): No  Physical Activity: Insufficiently Active  (01/06/2024)   Exercise Vital Sign    Days of Exercise per Week: 4 days    Minutes of Exercise per Session: 30 min  Stress: No Stress Concern Present (01/06/2024)   [employer] of Occupational Health - Occupational Stress Questionnaire    Feeling of Stress: Only a little  Social Connections: Socially Integrated (01/06/2024)   Social Connection and Isolation Panel    Frequency of Communication with Friends and Family: More than three times a week    Frequency of Social Gatherings with Friends and Family: Twice a week    Attends Religious Services: More than 4 times per year    Active Member of [employer] or Organizations: Yes    Attends Engineer, Structural: More than 4 times per year    Marital Status: Married     Family History: The patient's family history includes Arthritis in her brother, brother, and mother; Asthma in her brother; Breast cancer (age of onset: 84) in her maternal aunt; COPD in her mother; Cancer in her maternal grandfather; Early death in her father; Heart attack (age of onset: 70) in her father; Hypertension in her brother and mother. There is no history of Colon cancer.  ROS:   Review of Systems  Constitution: Negative for decreased appetite, fever and weight gain.  HENT: Negative for congestion, ear discharge, hoarse voice and sore throat.   Eyes: Negative for discharge, redness, vision loss in right eye and visual halos.  Cardiovascular: Negative for chest pain, dyspnea on exertion, leg swelling, orthopnea and palpitations.  Respiratory: Negative for cough, hemoptysis, shortness of breath and snoring.   Endocrine: Negative for heat intolerance and polyphagia.  Hematologic/Lymphatic: Negative for bleeding problem. Does not bruise/bleed easily.  Skin: Negative for flushing, nail changes, rash and suspicious lesions.  Musculoskeletal: Negative for arthritis, joint pain, muscle cramps, myalgias, neck pain and stiffness.  Gastrointestinal: Negative for  abdominal pain, bowel incontinence, diarrhea and excessive appetite.  Genitourinary: Negative for decreased libido, genital sores and incomplete emptying.  Neurological: Negative for brief paralysis, focal weakness, headaches and loss of balance.  Psychiatric/Behavioral: Negative for altered mental status, depression and suicidal ideas.  Allergic/Immunologic: Negative for HIV exposure and persistent infections.    EKGs/Labs/Other Studies Reviewed:    The following studies were reviewed today:   EKG:  The ekg ordered today demonstrates   Recent Labs: 05/29/2023: B Natriuretic Peptide 237.5 09/05/2023: ALT 13; Magnesium 2.3 01/09/2024: TSH 0.77 04/10/2024: BUN 13; Creatinine, Ser 0.80; Hemoglobin 14.1; Platelets 290; Potassium 4.6; Sodium 140  Recent Lipid Panel    Component Value Date/Time   CHOL 188 01/10/2023 0856   TRIG 79.0 01/10/2023 0856   HDL 69.70 01/10/2023 0856   CHOLHDL 3 01/10/2023 0856   VLDL 15.8 01/10/2023 0856   LDLCALC 102 (H) 01/10/2023 0856   LDLDIRECT  95.0 12/24/2019 0944    Physical Exam:    VS:  BP 120/72 (BP Location: Left Arm, Patient Position: Sitting, Cuff Size: Normal)   Pulse (!) 56   Ht 4' 11 (1.499 m)   Wt 96 lb (43.5 kg)   SpO2 96%   BMI 19.39 kg/m     Wt Readings from Last 3 Encounters:  04/10/24 96 lb (43.5 kg)  01/09/24 95 lb 3.2 oz (43.2 kg)  09/05/23 93 lb 1.6 oz (42.2 kg)     GEN: Well nourished, well developed in no acute distress HEENT: Normal NECK: No JVD; No carotid bruits LYMPHATICS: No lymphadenopathy CARDIAC: S1S2 noted,RRR, no murmurs, rubs, gallops RESPIRATORY:  Clear to auscultation without rales, wheezing or rhonchi  ABDOMEN: Soft, non-tender, non-distended, +bowel sounds, no guarding. EXTREMITIES: No edema, No cyanosis, no clubbing MUSCULOSKELETAL:  No deformity  SKIN: Warm and dry NEUROLOGIC:  Alert and oriented x 3, non-focal PSYCHIATRIC:  Normal affect, good insight  ASSESSMENT:    1. Primary hypertension    2. Nonrheumatic aortic valve insufficiency   3. Stress-induced cardiomyopathy   4. Nonrheumatic mitral valve regurgitation    PLAN:    Stress cardiomyopathy-this has resolved EF on March 06, 2024 is normal.  I am happy for the patient.  Continue current medication regimen losing losartan  12.5 mg daily, Toprol -XL 12.5 mg daily, Jardiance  10 mg daily.  Moderate to severe mitral regurgitation - clinically euvolemic.  It would be beneficial to get a transesophageal echocardiogram to assess the true nature of her mitral regurgitation and also understand the valve anatomy.  Informed Consent   Shared Decision Making/Informed Consent   The risks [esophageal damage, perforation (1:10,000 risk), bleeding, pharyngeal hematoma as well as other potential complications associated with conscious sedation including aspiration, arrhythmia, respiratory failure and death], benefits (treatment guidance and diagnostic support) and alternatives of a transesophageal echocardiogram were discussed in detail with Martha Swanson and she is willing to proceed.       Sinus bradycardia - asymptomatic. Will continue to monitor  The patient is in agreement with the above plan. The patient left the office in stable condition.  The patient will follow up in   Medication Adjustments/Labs and Tests Ordered: Current medicines are reviewed at length with the patient today.  Concerns regarding medicines are outlined above.  Orders Placed This Encounter  Procedures   Basic Metabolic Panel (BMET)   CBC   EKG 12-Lead   No orders of the defined types were placed in this encounter.   Patient Instructions    Testing/Procedures:  Your physician has requested that you have a TEE. During a TEE, sound waves are used to create images of your heart. It provides your doctor with information about the size and shape of your heart and how well your heart's chambers and valves are working. In this test, a transducer is attached to  the end of a flexible tube that's guided down your throat and into your esophagus (the tube leading from you mouth to your stomach) to get a more detailed image of your heart. You are not awake for the procedure. Please see the instruction sheet given to you today. For further information please visit [email or web address]    Follow-Up: At [facility], you and your health needs are our priority.  As part of our continuing mission to provide you with exceptional heart care, our providers are all part of one team.  This team includes your primary Cardiologist (physician) and Advanced Practice Providers or APPs (  Physician Assistants and Nurse Practitioners) who all work together to provide you with the care you need, when you need it.  Your next appointment:   12 month(s)  Provider:   Oliviagrace Crisanti, DO    Other Instructions  You are scheduled for a TEE (Transesophageal Echocardiogram) on Tuesday, October 28 with Dr. LONNI.  Please arrive at the [facility] (Main Entrance A) at [facility]: [address] [place], [place] [address] at 11:30 AM (This time is 1 hour(s) before your procedure to ensure your preparation).   Free valet parking service is available. You will check in at ADMITTING.   *Please Note: You will receive a call the day before your procedure to confirm the appointment time. That time may have changed from the original time based on the schedule for that day.*    DIET:  Nothing to eat or drink after midnight except a sip of water  with medications (see medication instructions below)   HOLD: Empagliflozin  (Jardiance ) for 3 days prior to the procedure. Last dose on Tuesday, October 28.    LABS: TODAY  FYI:  For your safety, and to allow us  to monitor your vital signs accurately during the surgery/procedure we request: If you have artificial nails, gel coating, SNS etc, please have those removed prior to your surgery/procedure. Not having the nail  coverings /polish removed may result in cancellation or delay of your surgery/procedure.  Your support person will be asked to wait in the waiting room during your procedure.  It is OK to have someone drop you off and come back when you are ready to be discharged.  You cannot drive after the procedure and will need someone to drive you home.  Bring your insurance cards.  *Special Note: Every effort is made to have your procedure done on time. Occasionally there are emergencies that occur at the hospital that may cause delays. Please be patient if a delay does occur.               Adopting a Healthy Lifestyle.  Know what a healthy weight is for you (roughly BMI <25) and aim to maintain this   Aim for 7+ servings of fruits and vegetables daily   65-80+ fluid ounces of water  or unsweet tea for healthy kidneys   Limit to max 1 drink of alcohol  per day; avoid smoking/tobacco   Limit animal fats in diet for cholesterol and heart health - choose grass fed whenever available   Avoid highly processed foods, and foods high in saturated/trans fats   Aim for low stress - take time to unwind and care for your mental health   Aim for 150 min of moderate intensity exercise weekly for heart health, and weights twice weekly for bone health   Aim for 7-9 hours of sleep daily   When it comes to diets, agreement about the perfect plan isnt easy to find, even among the experts. Experts at the [facility] [employer] developed an idea known as the Healthy Eating Plate. Just imagine a plate divided into logical, healthy portions.   The emphasis is on diet quality:   Load up on vegetables and fruits - one-half of your plate: Aim for color and variety, and remember that potatoes dont count.   Go for whole grains - one-quarter of your plate: Whole wheat, barley, wheat berries, quinoa, oats, brown rice, and foods made with them. If you want pasta, go with whole wheat pasta.   Protein power  -  one-quarter of your plate: Fish, chicken, beans, and nuts are all healthy, versatile protein sources. Limit red meat.   The diet, however, does go beyond the plate, offering a few other suggestions.   Use healthy plant oils, such as olive, canola, soy, corn, sunflower and peanut. Check the labels, and avoid partially hydrogenated oil, which have unhealthy trans fats.   If youre thirsty, drink water . Coffee and tea are good in moderation, but skip sugary drinks and limit milk and dairy products to one or two daily servings.   The type of carbohydrate in the diet is more important than the amount. Some sources of carbohydrates, such as vegetables, fruits, whole grains, and beans-are healthier than others.   Finally, stay active  Signed, Dub Huntsman, DO  04/11/2024 2:09 PM    [facility]

## 2024-04-11 LAB — BASIC METABOLIC PANEL WITH GFR
BUN/Creatinine Ratio: 16 (ref 12–28)
BUN: 13 mg/dL (ref 8–27)
CO2: 22 mmol/L (ref 20–29)
Calcium: 9.6 mg/dL (ref 8.7–10.3)
Chloride: 104 mmol/L (ref 96–106)
Creatinine, Ser: 0.8 mg/dL (ref 0.57–1.00)
Glucose: 82 mg/dL (ref 70–99)
Potassium: 4.6 mmol/L (ref 3.5–5.2)
Sodium: 140 mmol/L (ref 134–144)
eGFR: 75 mL/min/1.73 (ref >59)

## 2024-04-11 LAB — CBC
Hematocrit: 43.5 % (ref 34.0–46.6)
Hemoglobin: 14.1 g/dL (ref 11.1–15.9)
MCH: 30.5 pg (ref 26.6–33.0)
MCHC: 32.4 g/dL (ref 31.5–35.7)
MCV: 94 fL (ref 79–97)
Platelets: 290 x10E3/uL (ref 150–450)
RBC: 4.63 x10E6/uL (ref 3.77–5.28)
RDW: 12.8 % (ref 11.7–15.4)
WBC: 6.5 x10E3/uL (ref 3.4–10.8)

## 2024-04-13 NOTE — Progress Notes (Signed)
 Spoke to patient and instructed them to come at 1100  and to be NPO after 0000.     Confirmed that patient will have a ride home and someone to stay with them for 24 hours after the procedure.   Medications reviewed.  Confirmed blood thinner.  Confirmed no breaks in taking blood thinner for 3+ weeks prior to procedure. Confirmed patient stopped all GLP-1s and GLP-2s for at least one week before procedure.

## 2024-04-14 ENCOUNTER — Other Ambulatory Visit: Payer: Self-pay

## 2024-04-14 ENCOUNTER — Encounter (HOSPITAL_COMMUNITY): Payer: Self-pay | Admitting: Cardiology

## 2024-04-14 ENCOUNTER — Ambulatory Visit (HOSPITAL_COMMUNITY): Admitting: Anesthesiology

## 2024-04-14 ENCOUNTER — Ambulatory Visit (HOSPITAL_COMMUNITY)
Admission: RE | Admit: 2024-04-14 | Discharge: 2024-04-14 | Disposition: A | Source: Ambulatory Visit | Attending: Cardiology | Admitting: Cardiology

## 2024-04-14 ENCOUNTER — Encounter (HOSPITAL_COMMUNITY)
Admission: RE | Disposition: A | Discharge status: Home / Self Care | Payer: Self-pay | Source: Home / Self Care | Attending: Cardiology

## 2024-04-14 ENCOUNTER — Ambulatory Visit (HOSPITAL_COMMUNITY)
Admission: RE | Admit: 2024-04-14 | Discharge: 2024-04-14 | Disposition: A | Discharge status: Home / Self Care | Attending: Cardiology | Admitting: Cardiology

## 2024-04-14 DIAGNOSIS — I5181 Takotsubo syndrome: Secondary | ICD-10-CM | POA: Insufficient documentation

## 2024-04-14 DIAGNOSIS — I351 Nonrheumatic aortic (valve) insufficiency: Secondary | ICD-10-CM

## 2024-04-14 DIAGNOSIS — I10 Essential (primary) hypertension: Secondary | ICD-10-CM | POA: Diagnosis not present

## 2024-04-14 DIAGNOSIS — E871 Hypo-osmolality and hyponatremia: Secondary | ICD-10-CM | POA: Diagnosis not present

## 2024-04-14 DIAGNOSIS — I341 Nonrheumatic mitral (valve) prolapse: Secondary | ICD-10-CM | POA: Insufficient documentation

## 2024-04-14 DIAGNOSIS — I340 Nonrheumatic mitral (valve) insufficiency: Secondary | ICD-10-CM | POA: Diagnosis not present

## 2024-04-14 DIAGNOSIS — Z79890 Hormone replacement therapy (postmenopausal): Secondary | ICD-10-CM | POA: Insufficient documentation

## 2024-04-14 DIAGNOSIS — I5022 Chronic systolic (congestive) heart failure: Secondary | ICD-10-CM | POA: Insufficient documentation

## 2024-04-14 DIAGNOSIS — E039 Hypothyroidism, unspecified: Secondary | ICD-10-CM

## 2024-04-14 DIAGNOSIS — I110 Hypertensive heart disease with heart failure: Secondary | ICD-10-CM | POA: Diagnosis not present

## 2024-04-14 DIAGNOSIS — Z79899 Other long term (current) drug therapy: Secondary | ICD-10-CM | POA: Diagnosis not present

## 2024-04-14 DIAGNOSIS — I088 Other rheumatic multiple valve diseases: Secondary | ICD-10-CM | POA: Diagnosis not present

## 2024-04-14 LAB — ECHO TEE

## 2024-04-14 SURGERY — TRANSESOPHAGEAL ECHOCARDIOGRAM (TEE) (CATHLAB)
Anesthesia: Monitor Anesthesia Care

## 2024-04-14 MED ORDER — PROPOFOL 10 MG/ML IV BOLUS
INTRAVENOUS | Status: DC | PRN
  Administered 2024-04-14: 40 mg via INTRAVENOUS
  Administered 2024-04-14 (×3): 30 mg via INTRAVENOUS
  Administered 2024-04-14: 20 mg via INTRAVENOUS
  Administered 2024-04-14: 30 mg via INTRAVENOUS
  Administered 2024-04-14: 70 mg via INTRAVENOUS
  Administered 2024-04-14: 20 mg via INTRAVENOUS
  Administered 2024-04-14 (×2): 30 mg via INTRAVENOUS

## 2024-04-14 MED ORDER — LIDOCAINE 2% (20 MG/ML) 5 ML SYRINGE
INTRAMUSCULAR | Status: DC | PRN
  Administered 2024-04-14: 50 mg via INTRAVENOUS

## 2024-04-14 MED ORDER — SODIUM CHLORIDE 0.9 % IV SOLN: INTRAVENOUS | Status: DC

## 2024-04-14 NOTE — CV Procedure (Signed)
    TRANSESOPHAGEAL ECHOCARDIOGRAM   NAME:  TELISSA PALMISANO   MRN: [id number] DOB:  1946-05-28   ADMIT DATE: 04/14/2024  INDICATIONS: Mitral regurgitation  PROCEDURE:   Informed consent was obtained prior to the procedure. The risks, benefits and alternatives for the procedure were discussed and the patient comprehended these risks.  Risks include, but are not limited to, cough, sore throat, vomiting, nausea, somnolence, esophageal and stomach trauma or perforation, bleeding, low blood pressure, aspiration, pneumonia, infection, trauma to the teeth and death.    Procedural time out performed.   Patient received monitored anesthesia care under the supervision of Dr. Epifanio. Patient received a total of 330 mg propofol during the procedure.  The transesophageal probe was inserted in the esophagus and stomach without difficulty and multiple views were obtained.    COMPLICATIONS:    There were no immediate complications.  FINDINGS:  LEFT VENTRICLE: EF = 60-65%. No regional wall motion abnormalities.  RIGHT VENTRICLE: Normal size and function.   LEFT ATRIUM: No thrombus/mass.  LEFT ATRIAL APPENDAGE: No thrombus/mass.   RIGHT ATRIUM: No thrombus/mass.  AORTIC VALVE:  Trileaflet. Trivial regurgitation. No vegetation.  MITRAL VALVE:    Myxomatous valve. There is minimal prolapse of anterior leaflet (see image 21) and severe prolapse of posterior leaflet, extending 1 cm above the mitral valve annulus. There is moderate mid to late systolic regurgitation. There are two jets, so assessment by PISA is difficult. No pulmonary vein flow reversal noted. No vegetation.  TRICUSPID VALVE: Normal structure. Trivial regurgitation. No vegetation.  PULMONIC VALVE: Grossly normal structure. Trivial regurgitation. No apparent vegetation.  INTERATRIAL SEPTUM: No PFO or ASD seen by color Doppler. Bubble study negative for intra-atrial shunt, few late positive bubbles suggest possible  extracardiac shunt  PERICARDIUM: No effusion noted.  DESCENDING AORTA: Mild scattered plaque seen   CONCLUSION: Mitral valve prolapse, most predominantly P2, with moderate mitral regurgitation   Shelda Bruckner, MD, PhD, [facility]  [facility]  [facility] at [facility] [address], [address] [place], [place] [address] [phone]   12:26 PM

## 2024-04-14 NOTE — Interval H&P Note (Signed)
 History and Physical Interval Note:  04/14/2024 11:46 AM  Martha Swanson  has presented today for surgery, with the diagnosis of MITRAL VALVE DISORDER.  The various methods of treatment have been discussed with the patient and family. After consideration of risks, benefits and other options for treatment, the patient has consented to  Procedure(s): TRANSESOPHAGEAL ECHOCARDIOGRAM (N/A) as a surgical intervention.  The patient's history has been reviewed, patient examined, no change in status, stable for surgery.  I have reviewed the patient's chart and labs.  Questions were answered to the patient's satisfaction.     Estell Dillinger Lonni

## 2024-04-14 NOTE — Anesthesia Postprocedure Evaluation (Signed)
 Anesthesia Post Note  Patient: Martha Swanson  Procedure(s) Performed: ECHO TEE TRANSESOPHAGEAL ECHOCARDIOGRAM     Patient location during evaluation: Cath Lab Anesthesia Type: MAC Level of consciousness: awake and alert Pain management: pain level controlled Vital Signs Assessment: post-procedure vital signs reviewed and stable Respiratory status: spontaneous breathing, nonlabored ventilation and respiratory function stable Cardiovascular status: stable and blood pressure returned to baseline Postop Assessment: no apparent nausea or vomiting Anesthetic complications: no   There were no known notable events for this encounter.  Last Vitals:  Vitals:   04/14/24 1255 04/14/24 1300  BP: 126/74 (!) 129/59  Pulse: 63 67  Resp: 14 17  Temp:    SpO2: 96% 98%    Last Pain:  Vitals:   04/14/24 1300  TempSrc:   PainSc: 0-No pain                 Islah Eve,W. EDMOND

## 2024-04-14 NOTE — Transfer of Care (Signed)
 Immediate Anesthesia Transfer of Care Note  Patient: Martha Swanson  Procedure(s) Performed: ECHO TEE TRANSESOPHAGEAL ECHOCARDIOGRAM  Patient Location: PACU  Anesthesia Type:General  Level of Consciousness: drowsy  Airway & Oxygen Therapy: Patient Spontanous Breathing  Post-op Assessment: Report given to RN and Post -op Vital signs reviewed and stable  Post vital signs: Reviewed and stable  Last Vitals:  Vitals Value Taken Time  BP 102/63   Temp    Pulse 64   Resp 18   SpO2 99     Last Pain:  Vitals:   04/14/24 1058  TempSrc: Temporal         Complications: There were no known notable events for this encounter.

## 2024-04-14 NOTE — Anesthesia Preprocedure Evaluation (Addendum)
 Anesthesia Evaluation  Patient identified by MRN, date of birth, ID band Patient awake    Reviewed: Allergy & Precautions, H&P , NPO status , Patient's Chart, lab work & pertinent test results, reviewed documented beta blocker date and time   Airway Mallampati: III  TM Distance: >3 FB Neck ROM: Full    Dental no notable dental hx. (+) Teeth Intact, Dental Advisory Given   Pulmonary neg pulmonary ROS   Pulmonary exam normal breath sounds clear to auscultation       Cardiovascular hypertension, Pt. on medications and Pt. on home beta blockers + Valvular Problems/Murmurs MR  Rhythm:Regular Rate:Normal     Neuro/Psych negative neurological ROS  negative psych ROS   GI/Hepatic negative GI ROS, Neg liver ROS,,,  Endo/Other  Hypothyroidism    Renal/GU negative Renal ROS  negative genitourinary   Musculoskeletal   Abdominal   Peds  Hematology negative hematology ROS (+)   Anesthesia Other Findings   Reproductive/Obstetrics negative OB ROS                              Anesthesia Physical Anesthesia Plan  ASA: 3  Anesthesia Plan: MAC   Post-op Pain Management: Minimal or no pain anticipated   Induction: Intravenous  PONV Risk Score and Plan: 2 and Propofol infusion and Treatment may vary due to age or medical condition  Airway Management Planned: Natural Airway and Simple Face Mask  Additional Equipment:   Intra-op Plan:   Post-operative Plan:   Informed Consent: I have reviewed the patients History and Physical, chart, labs and discussed the procedure including the risks, benefits and alternatives for the proposed anesthesia with the patient or authorized representative who has indicated his/her understanding and acceptance.     Dental advisory given  Plan Discussed with: CRNA  Anesthesia Plan Comments:          Anesthesia Quick Evaluation

## 2024-04-16 ENCOUNTER — Ambulatory Visit: Payer: Self-pay | Admitting: Cardiology

## 2024-05-01 DIAGNOSIS — Z23 Encounter for immunization: Secondary | ICD-10-CM | POA: Diagnosis not present

## 2024-05-23 ENCOUNTER — Other Ambulatory Visit (HOSPITAL_COMMUNITY): Payer: Self-pay | Admitting: Physician Assistant

## 2024-07-10 ENCOUNTER — Encounter: Payer: Self-pay | Admitting: Family Medicine

## 2024-07-10 ENCOUNTER — Ambulatory Visit: Payer: Self-pay | Admitting: Family Medicine

## 2024-07-10 VITALS — BP 120/74 | HR 77 | Temp 97.8°F | Ht 59.0 in | Wt 95.0 lb

## 2024-07-10 DIAGNOSIS — E7800 Pure hypercholesterolemia, unspecified: Secondary | ICD-10-CM | POA: Diagnosis not present

## 2024-07-10 DIAGNOSIS — E559 Vitamin D deficiency, unspecified: Secondary | ICD-10-CM | POA: Diagnosis not present

## 2024-07-10 DIAGNOSIS — I10 Essential (primary) hypertension: Secondary | ICD-10-CM | POA: Diagnosis not present

## 2024-07-10 DIAGNOSIS — Z131 Encounter for screening for diabetes mellitus: Secondary | ICD-10-CM | POA: Diagnosis not present

## 2024-07-10 DIAGNOSIS — E039 Hypothyroidism, unspecified: Secondary | ICD-10-CM

## 2024-07-10 LAB — LIPID PANEL
Cholesterol: 195 mg/dL (ref 28–200)
HDL: 74.7 mg/dL
LDL Cholesterol: 106 mg/dL — ABNORMAL HIGH (ref 10–99)
NonHDL: 120.65
Total CHOL/HDL Ratio: 3
Triglycerides: 72 mg/dL (ref 10.0–149.0)
VLDL: 14.4 mg/dL (ref 0.0–40.0)

## 2024-07-10 LAB — COMPREHENSIVE METABOLIC PANEL WITH GFR
ALT: 13 U/L (ref 3–35)
AST: 16 U/L (ref 5–37)
Albumin: 4.3 g/dL (ref 3.5–5.2)
Alkaline Phosphatase: 55 U/L (ref 39–117)
BUN: 12 mg/dL (ref 6–23)
CO2: 26 meq/L (ref 19–32)
Calcium: 9.8 mg/dL (ref 8.4–10.5)
Chloride: 102 meq/L (ref 96–112)
Creatinine, Ser: 0.82 mg/dL (ref 0.40–1.20)
GFR: 68.35 mL/min
Glucose, Bld: 93 mg/dL (ref 70–99)
Potassium: 4.1 meq/L (ref 3.5–5.1)
Sodium: 136 meq/L (ref 135–145)
Total Bilirubin: 0.7 mg/dL (ref 0.2–1.2)
Total Protein: 6.8 g/dL (ref 6.0–8.3)

## 2024-07-10 LAB — VITAMIN D 25 HYDROXY (VIT D DEFICIENCY, FRACTURES): VITD: 37.19 ng/mL (ref 30.00–100.00)

## 2024-07-10 LAB — TSH: TSH: 1.58 u[IU]/mL (ref 0.35–5.50)

## 2024-07-10 LAB — HEMOGLOBIN A1C: Hgb A1c MFr Bld: 5.3 % (ref 4.6–6.5)

## 2024-07-10 NOTE — Progress Notes (Signed)
 "  Established Patient Office Visit   Subjective:  Patient ID: Martha Swanson, female    DOB: [birth year]  Age: 79 y.o. MRN: [id number]  Chief Complaint  Patient presents with   Medical Management of Chronic Issues    Six month follow-up     HPI Encounter Diagnoses  Name Primary?   Acquired hypothyroidism Yes   Vitamin D  deficiency    Essential hypertension    Elevated LDL cholesterol level    Screening for diabetes mellitus    For follow-up of above.  Doing well.  [place] [facility] [place] [facility] [employer] [facility] [place] [facility].  Status post recent transesophageal echocardiogram that did not show left ventricular function there was mild mitral valve regurgitation.  Continues on Os-Cal with vitamin D .  Continues Jardiance  through cardiology for compensated Takotsubo cardiomyopathy.   Review of Systems  Constitutional: Negative.   HENT: Negative.    Eyes:  Negative for blurred vision, discharge and redness.  Respiratory: Negative.    Cardiovascular: Negative.   Gastrointestinal:  Negative for abdominal pain.  Genitourinary: Negative.   Musculoskeletal: Negative.  Negative for myalgias.  Skin:  Negative for rash.  Neurological:  Negative for tingling, loss of consciousness and weakness.  Endo/Heme/Allergies:  Negative for polydipsia.    Current Medications[1]   Objective:     BP 120/74   Pulse 77   Temp 97.8 F (36.6 C)   Ht 4' 11 (1.499 m)   Wt 95 lb (43.1 kg)   SpO2 99%   BMI 19.19 kg/m    Physical Exam Constitutional:      General: She is not in acute distress.    Appearance: Normal appearance. She is not ill-appearing, toxic-appearing or diaphoretic.  HENT:     Head: Normocephalic and atraumatic.     Right Ear: External ear normal.     Left Ear: External ear normal.     Mouth/Throat:     Mouth: Mucous membranes are moist.     Pharynx: Oropharynx is clear. No oropharyngeal exudate or posterior oropharyngeal erythema.  Eyes:     General: No scleral  icterus.       Right eye: No discharge.        Left eye: No discharge.     Extraocular Movements: Extraocular movements intact.     Conjunctiva/sclera: Conjunctivae normal.     Pupils: Pupils are equal, round, and reactive to light.  Cardiovascular:     Rate and Rhythm: Normal rate and regular rhythm.     Heart sounds: Murmur heard.  Pulmonary:     Effort: Pulmonary effort is normal. No respiratory distress.     Breath sounds: Normal breath sounds. No wheezing or rales.  Musculoskeletal:     Cervical back: No rigidity or tenderness.  Skin:    General: Skin is warm and dry.  Neurological:     Mental Status: She is alert and oriented to person, place, and time.  Psychiatric:        Mood and Affect: Mood normal.        Behavior: Behavior normal.      No results found for any visits on 07/10/24.    The ASCVD Risk score (Arnett DK, et al., 2019) failed to calculate for the following reasons:   Risk score cannot be calculated because patient has a medical history suggesting prior/existing ASCVD   * - Cholesterol units were assumed    Assessment & Plan:   Acquired hypothyroidism -     TSH  Vitamin D  deficiency -     VITAMIN D  25 Hydroxy (Vit-D Deficiency, Fractures)  Essential hypertension -     Comprehensive metabolic panel with GFR  Elevated LDL cholesterol level -     Comprehensive metabolic panel with GFR -     Lipid panel  Screening for diabetes mellitus -     Comprehensive metabolic panel with GFR -     Hemoglobin A1c    Return in about 6 months (around 01/07/2025).    Elsie Sim Lent, MD    [1]  Current Outpatient Medications:    calcium  carbonate (OS-CAL) 600 MG TABS tablet, Take 1 tablet (600 mg total) by mouth 2 (two) times daily with a meal., Disp: 60 tablet, Rfl: 12   empagliflozin  (JARDIANCE ) 10 MG TABS tablet, Take 1 tablet (10 mg total) by mouth daily., Disp: 30 tablet, Rfl: 11   levothyroxine  (SYNTHROID ) 50 MCG tablet, TAKE 1 TABLET BY  MOUTH EVERY DAY BEFORE BREAKFAST, Disp: 90 tablet, Rfl: 2   losartan  (COZAAR ) 25 MG tablet, TAKE 1/2 TABLET(12.5 MG) BY MOUTH DAILY, Disp: 45 tablet, Rfl: 3   metoprolol  succinate (TOPROL -XL) 25 MG 24 hr tablet, Take 0.5 tablets (12.5 mg total) by mouth daily., Disp: 45 tablet, Rfl: 3   Multiple Vitamin (MULTIVITAMIN WITH MINERALS) TABS tablet, Take 1 tablet by mouth daily., Disp: , Rfl:    furosemide  (LASIX ) 20 MG tablet, Take 1 tablet (20 mg total) by mouth daily as needed for fluid or edema., Disp: 30 tablet, Rfl: 2  "

## 2024-07-13 ENCOUNTER — Ambulatory Visit: Payer: Self-pay | Admitting: Family Medicine

## 2025-01-07 ENCOUNTER — Ambulatory Visit: Admitting: Family Medicine
